# Patient Record
Sex: Female | Born: 1965 | Race: White | Hispanic: No | Marital: Single | Smoking: Former smoker
Health system: Southern US, Community
[De-identification: ages and names within clinical notes are randomized; demographics above are authoritative.]

## PROBLEM LIST (undated history)

## (undated) DIAGNOSIS — Z974 Presence of external hearing-aid: Secondary | ICD-10-CM

## (undated) DIAGNOSIS — M48061 Spinal stenosis, lumbar region without neurogenic claudication: Secondary | ICD-10-CM

## (undated) DIAGNOSIS — I10 Essential (primary) hypertension: Secondary | ICD-10-CM

## (undated) DIAGNOSIS — G4733 Obstructive sleep apnea (adult) (pediatric): Secondary | ICD-10-CM

## (undated) DIAGNOSIS — G4730 Sleep apnea, unspecified: Secondary | ICD-10-CM

## (undated) DIAGNOSIS — M5416 Radiculopathy, lumbar region: Secondary | ICD-10-CM

## (undated) DIAGNOSIS — M2170 Unequal limb length (acquired), unspecified site: Secondary | ICD-10-CM

## (undated) DIAGNOSIS — M301 Polyarteritis with lung involvement [Churg-Strauss]: Secondary | ICD-10-CM

## (undated) DIAGNOSIS — I4891 Unspecified atrial fibrillation: Secondary | ICD-10-CM

## (undated) DIAGNOSIS — J4489 Other specified chronic obstructive pulmonary disease: Secondary | ICD-10-CM

## (undated) DIAGNOSIS — I509 Heart failure, unspecified: Secondary | ICD-10-CM

## (undated) DIAGNOSIS — D72829 Elevated white blood cell count, unspecified: Secondary | ICD-10-CM

## (undated) DIAGNOSIS — G8929 Other chronic pain: Secondary | ICD-10-CM

## (undated) DIAGNOSIS — I209 Angina pectoris, unspecified: Secondary | ICD-10-CM

## (undated) DIAGNOSIS — Q2112 Patent foramen ovale: Secondary | ICD-10-CM

## (undated) DIAGNOSIS — I776 Arteritis, unspecified: Secondary | ICD-10-CM

## (undated) DIAGNOSIS — R001 Bradycardia, unspecified: Secondary | ICD-10-CM

## (undated) DIAGNOSIS — M313 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: Secondary | ICD-10-CM

## (undated) DIAGNOSIS — Q211 Atrial septal defect: Secondary | ICD-10-CM

## (undated) DIAGNOSIS — U071 COVID-19: Secondary | ICD-10-CM

## (undated) DIAGNOSIS — J301 Allergic rhinitis due to pollen: Secondary | ICD-10-CM

## (undated) DIAGNOSIS — R600 Localized edema: Secondary | ICD-10-CM

## (undated) DIAGNOSIS — Z86711 Personal history of pulmonary embolism: Secondary | ICD-10-CM

## (undated) DIAGNOSIS — D649 Anemia, unspecified: Secondary | ICD-10-CM

## (undated) DIAGNOSIS — Z87442 Personal history of urinary calculi: Secondary | ICD-10-CM

## (undated) DIAGNOSIS — E669 Obesity, unspecified: Secondary | ICD-10-CM

## (undated) DIAGNOSIS — M8580 Other specified disorders of bone density and structure, unspecified site: Secondary | ICD-10-CM

## (undated) DIAGNOSIS — M1990 Unspecified osteoarthritis, unspecified site: Secondary | ICD-10-CM

## (undated) DIAGNOSIS — G894 Chronic pain syndrome: Secondary | ICD-10-CM

## (undated) DIAGNOSIS — J849 Interstitial pulmonary disease, unspecified: Secondary | ICD-10-CM

## (undated) DIAGNOSIS — G43909 Migraine, unspecified, not intractable, without status migrainosus: Secondary | ICD-10-CM

## (undated) DIAGNOSIS — K3184 Gastroparesis: Secondary | ICD-10-CM

## (undated) DIAGNOSIS — M419 Scoliosis, unspecified: Secondary | ICD-10-CM

## (undated) DIAGNOSIS — I499 Cardiac arrhythmia, unspecified: Secondary | ICD-10-CM

## (undated) DIAGNOSIS — T82539A Leakage of unspecified cardiac and vascular devices and implants, initial encounter: Secondary | ICD-10-CM

## (undated) DIAGNOSIS — I2510 Atherosclerotic heart disease of native coronary artery without angina pectoris: Secondary | ICD-10-CM

## (undated) DIAGNOSIS — I5030 Unspecified diastolic (congestive) heart failure: Secondary | ICD-10-CM

## (undated) DIAGNOSIS — M359 Systemic involvement of connective tissue, unspecified: Secondary | ICD-10-CM

## (undated) DIAGNOSIS — K08109 Complete loss of teeth, unspecified cause, unspecified class: Secondary | ICD-10-CM

## (undated) DIAGNOSIS — E785 Hyperlipidemia, unspecified: Secondary | ICD-10-CM

## (undated) DIAGNOSIS — M069 Rheumatoid arthritis, unspecified: Secondary | ICD-10-CM

## (undated) DIAGNOSIS — Q245 Malformation of coronary vessels: Secondary | ICD-10-CM

## (undated) DIAGNOSIS — J320 Chronic maxillary sinusitis: Secondary | ICD-10-CM

## (undated) DIAGNOSIS — J1282 Pneumonia due to coronavirus disease 2019: Secondary | ICD-10-CM

## (undated) DIAGNOSIS — Z8719 Personal history of other diseases of the digestive system: Secondary | ICD-10-CM

## (undated) DIAGNOSIS — Z972 Presence of dental prosthetic device (complete) (partial): Secondary | ICD-10-CM

## (undated) DIAGNOSIS — F1190 Opioid use, unspecified, uncomplicated: Secondary | ICD-10-CM

## (undated) DIAGNOSIS — K219 Gastro-esophageal reflux disease without esophagitis: Secondary | ICD-10-CM

## (undated) DIAGNOSIS — I700 Atherosclerosis of aorta: Secondary | ICD-10-CM

## (undated) DIAGNOSIS — M310 Hypersensitivity angiitis: Secondary | ICD-10-CM

## (undated) DIAGNOSIS — Z9689 Presence of other specified functional implants: Secondary | ICD-10-CM

## (undated) DIAGNOSIS — R768 Other specified abnormal immunological findings in serum: Secondary | ICD-10-CM

## (undated) HISTORY — PX: APPENDECTOMY: SHX54

## (undated) HISTORY — DX: Arteritis, unspecified: I77.6

## (undated) HISTORY — DX: Gastro-esophageal reflux disease without esophagitis: K21.9

## (undated) HISTORY — PX: SHOULDER SURGERY: SHX246

## (undated) HISTORY — PX: KNEE SURGERY: SHX244

## (undated) HISTORY — DX: Heart failure, unspecified: I50.9

## (undated) HISTORY — PX: BACK SURGERY: SHX140

## (undated) HISTORY — PX: SHOULDER ARTHROSCOPY: SHX128

## (undated) HISTORY — DX: Rheumatoid arthritis, unspecified: M06.9

## (undated) HISTORY — PX: OTHER SURGICAL HISTORY: SHX169

## (undated) HISTORY — PX: JOINT REPLACEMENT: SHX530

## (undated) HISTORY — PX: KNEE ARTHROSCOPY WITH MENISCAL REPAIR: SHX5653

## (undated) HISTORY — PX: KNEE ARTHROPLASTY: SHX992

## (undated) HISTORY — PX: CHOLECYSTECTOMY: SHX55

---

## 2007-09-05 ENCOUNTER — Emergency Department: Payer: Self-pay | Admitting: Emergency Medicine

## 2007-09-14 ENCOUNTER — Emergency Department (HOSPITAL_COMMUNITY): Admission: EM | Admit: 2007-09-14 | Discharge: 2007-09-14 | Payer: Self-pay | Admitting: Emergency Medicine

## 2007-11-22 ENCOUNTER — Emergency Department: Payer: Self-pay | Admitting: Emergency Medicine

## 2008-01-15 ENCOUNTER — Emergency Department: Payer: Self-pay | Admitting: Emergency Medicine

## 2008-02-23 ENCOUNTER — Emergency Department: Payer: Self-pay | Admitting: Emergency Medicine

## 2008-02-24 ENCOUNTER — Emergency Department (HOSPITAL_COMMUNITY): Admission: EM | Admit: 2008-02-24 | Discharge: 2008-02-24 | Payer: Self-pay | Admitting: Psychology

## 2008-04-07 ENCOUNTER — Emergency Department: Payer: Self-pay | Admitting: Emergency Medicine

## 2008-05-02 ENCOUNTER — Emergency Department: Payer: Self-pay | Admitting: Emergency Medicine

## 2008-05-03 ENCOUNTER — Emergency Department: Payer: Self-pay | Admitting: Emergency Medicine

## 2008-06-27 ENCOUNTER — Ambulatory Visit: Payer: Self-pay | Admitting: Specialist

## 2008-06-29 ENCOUNTER — Emergency Department: Payer: Self-pay | Admitting: Emergency Medicine

## 2008-07-05 ENCOUNTER — Ambulatory Visit: Payer: Self-pay | Admitting: Specialist

## 2008-08-09 ENCOUNTER — Emergency Department: Payer: Self-pay | Admitting: Emergency Medicine

## 2008-08-09 ENCOUNTER — Other Ambulatory Visit: Payer: Self-pay

## 2008-09-18 ENCOUNTER — Emergency Department: Payer: Self-pay | Admitting: Emergency Medicine

## 2008-11-01 ENCOUNTER — Emergency Department: Payer: Self-pay | Admitting: Unknown Physician Specialty

## 2009-02-02 ENCOUNTER — Emergency Department: Payer: Self-pay | Admitting: Emergency Medicine

## 2009-02-04 ENCOUNTER — Emergency Department: Payer: Self-pay | Admitting: Emergency Medicine

## 2009-03-13 ENCOUNTER — Emergency Department: Payer: Self-pay | Admitting: Emergency Medicine

## 2009-03-29 ENCOUNTER — Emergency Department: Payer: Self-pay | Admitting: Emergency Medicine

## 2009-03-31 ENCOUNTER — Emergency Department: Payer: Self-pay | Admitting: Emergency Medicine

## 2009-04-11 ENCOUNTER — Emergency Department: Payer: Self-pay | Admitting: Emergency Medicine

## 2009-08-17 ENCOUNTER — Emergency Department: Payer: Self-pay | Admitting: Emergency Medicine

## 2017-01-20 DIAGNOSIS — G894 Chronic pain syndrome: Secondary | ICD-10-CM | POA: Insufficient documentation

## 2017-01-20 DIAGNOSIS — M47816 Spondylosis without myelopathy or radiculopathy, lumbar region: Secondary | ICD-10-CM | POA: Insufficient documentation

## 2017-08-17 DIAGNOSIS — Q211 Atrial septal defect: Secondary | ICD-10-CM | POA: Insufficient documentation

## 2017-08-17 DIAGNOSIS — Q2112 Patent foramen ovale: Secondary | ICD-10-CM | POA: Insufficient documentation

## 2018-01-16 DIAGNOSIS — M5416 Radiculopathy, lumbar region: Secondary | ICD-10-CM | POA: Insufficient documentation

## 2018-01-16 DIAGNOSIS — M48061 Spinal stenosis, lumbar region without neurogenic claudication: Secondary | ICD-10-CM | POA: Insufficient documentation

## 2018-01-16 DIAGNOSIS — M25551 Pain in right hip: Secondary | ICD-10-CM | POA: Insufficient documentation

## 2018-09-13 DIAGNOSIS — M159 Polyosteoarthritis, unspecified: Secondary | ICD-10-CM | POA: Insufficient documentation

## 2018-09-29 DIAGNOSIS — K3184 Gastroparesis: Secondary | ICD-10-CM | POA: Insufficient documentation

## 2018-10-10 DIAGNOSIS — M313 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: Secondary | ICD-10-CM | POA: Insufficient documentation

## 2019-01-01 DIAGNOSIS — M069 Rheumatoid arthritis, unspecified: Secondary | ICD-10-CM | POA: Insufficient documentation

## 2019-01-01 DIAGNOSIS — J45909 Unspecified asthma, uncomplicated: Secondary | ICD-10-CM | POA: Insufficient documentation

## 2019-01-01 DIAGNOSIS — I482 Chronic atrial fibrillation, unspecified: Secondary | ICD-10-CM | POA: Insufficient documentation

## 2019-01-01 DIAGNOSIS — G4733 Obstructive sleep apnea (adult) (pediatric): Secondary | ICD-10-CM | POA: Insufficient documentation

## 2019-01-01 DIAGNOSIS — I509 Heart failure, unspecified: Secondary | ICD-10-CM | POA: Insufficient documentation

## 2019-01-01 DIAGNOSIS — I25119 Atherosclerotic heart disease of native coronary artery with unspecified angina pectoris: Secondary | ICD-10-CM | POA: Insufficient documentation

## 2019-01-08 ENCOUNTER — Other Ambulatory Visit: Payer: Self-pay | Admitting: Internal Medicine

## 2019-01-08 DIAGNOSIS — J329 Chronic sinusitis, unspecified: Secondary | ICD-10-CM

## 2019-01-08 DIAGNOSIS — J3489 Other specified disorders of nose and nasal sinuses: Secondary | ICD-10-CM

## 2019-01-24 ENCOUNTER — Ambulatory Visit: Payer: Self-pay

## 2019-01-29 ENCOUNTER — Ambulatory Visit
Admission: RE | Admit: 2019-01-29 | Discharge: 2019-01-29 | Disposition: A | Discharge status: Home / Self Care | Payer: Medicare Other | Source: Ambulatory Visit | Attending: Internal Medicine | Admitting: Internal Medicine

## 2019-01-29 DIAGNOSIS — J329 Chronic sinusitis, unspecified: Secondary | ICD-10-CM

## 2019-01-29 DIAGNOSIS — J3489 Other specified disorders of nose and nasal sinuses: Secondary | ICD-10-CM

## 2019-02-13 DIAGNOSIS — G8929 Other chronic pain: Secondary | ICD-10-CM | POA: Insufficient documentation

## 2019-03-29 ENCOUNTER — Ambulatory Visit: Payer: Medicare Other | Admitting: Student in an Organized Health Care Education/Training Program

## 2019-06-26 ENCOUNTER — Other Ambulatory Visit: Payer: Self-pay | Admitting: Physical Medicine and Rehabilitation

## 2019-06-26 ENCOUNTER — Other Ambulatory Visit: Payer: Self-pay | Admitting: Internal Medicine

## 2019-06-26 DIAGNOSIS — M5416 Radiculopathy, lumbar region: Secondary | ICD-10-CM

## 2019-06-26 DIAGNOSIS — Z1231 Encounter for screening mammogram for malignant neoplasm of breast: Secondary | ICD-10-CM

## 2019-07-31 ENCOUNTER — Ambulatory Visit
Admission: RE | Admit: 2019-07-31 | Discharge: 2019-07-31 | Disposition: A | Discharge status: Home / Self Care | Payer: Medicare Other | Source: Ambulatory Visit | Attending: Physical Medicine and Rehabilitation | Admitting: Physical Medicine and Rehabilitation

## 2019-07-31 DIAGNOSIS — M5416 Radiculopathy, lumbar region: Secondary | ICD-10-CM

## 2019-08-06 ENCOUNTER — Ambulatory Visit
Admission: RE | Admit: 2019-08-06 | Discharge: 2019-08-06 | Disposition: A | Discharge status: Home / Self Care | Payer: Medicare Other | Source: Ambulatory Visit | Attending: Internal Medicine | Admitting: Internal Medicine

## 2019-08-06 ENCOUNTER — Other Ambulatory Visit: Payer: Self-pay

## 2019-08-06 DIAGNOSIS — Z1231 Encounter for screening mammogram for malignant neoplasm of breast: Secondary | ICD-10-CM

## 2020-01-20 DIAGNOSIS — Z96651 Presence of right artificial knee joint: Secondary | ICD-10-CM | POA: Insufficient documentation

## 2020-01-20 DIAGNOSIS — I493 Ventricular premature depolarization: Secondary | ICD-10-CM | POA: Insufficient documentation

## 2020-01-28 ENCOUNTER — Encounter: Payer: Self-pay | Admitting: Student in an Organized Health Care Education/Training Program

## 2020-01-28 NOTE — Progress Notes (Addendum)
Patient: Cassandra Brown  Service Category: E/M  Provider: Gillis Santa, MD  DOB: [birth year]  DOS: 01/29/2020  Location: Office  MRN: [id number]  Setting: Ambulatory outpatient  Referring Provider: Deetta Perla, MD  Type: New Patient  Specialty: Interventional Pain Management  PCP: Baxter Hire, MD  Location: Home  Delivery: TeleHealth     Virtual Encounter - Pain Management PROVIDER NOTE: Information contained herein reflects review and annotations entered in association with encounter. Interpretation of such information and data should be left to medically-trained personnel. Information provided to patient can be located elsewhere in the medical record under "Patient Instructions". Document created using STT-dictation technology, any transcriptional errors that may result from process are unintentional.    Contact & Pharmacy Preferred: [phone] Home: [phone] (home) Mobile: [phone] (mobile) E-mail: [email or web address] [place] - [place] [address] Phone: [phone] Fax: [phone]   Pre-screening note:  Our staff contacted Cassandra Brown and offered her an "in person", "face-to-face" appointment versus a telephone encounter. She indicated preferring the telephone encounter, at this time.  Primary Reason(s) for Visit: Tele-Encounter for initial evaluation of one or more chronic problems (new to examiner) potentially causing chronic pain, and posing a threat to normal musculoskeletal function. (Level of risk: High) CC: Back Pain (low)  I contacted Cassandra Brown on 01/29/2020 via telephone.      I clearly identified myself as Gillis Santa, MD. I verified that I was speaking with the correct person using two identifiers (Name: Cassandra Brown, and date of birth: 11-03-1966).  This visit was completed via telephone due to the restrictions of the COVID-19 pandemic. All issues as above were discussed and addressed  but no physical exam was performed. If it was felt that the patient should be evaluated in the office, they were directed there. The patient verbally consented to this visit. Patient was unable to complete an audio/visual visit due to Technical difficulties and/or Lack of internet. Due to the catastrophic nature of the COVID-19 pandemic, this visit was done through audio contact only.  Location of the patient: home address (see Epic for details)  Location of the provider: office  Advanced Informed Consent I sought verbal advanced consent from Cassandra Brown for virtual visit interactions. I informed Cassandra Brown of possible security and privacy concerns, risks, and limitations associated with providing "not-in-person" medical evaluation and management services. I also informed Cassandra Brown of the availability of "in-person" appointments. Finally, I informed her that there would be a charge for the virtual visit and that she could be  personally, fully or partially, financially responsible for it. Cassandra Brown expressed understanding and agreed to proceed.   HPI  Cassandra Brown is a 54 y.o. year old, female patient, contacted today for an initial evaluation of her chronic pain. 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   Onset and Duration: Gradual and Present longer than 3 months Cause of  pain: Unknown Severity: Getting worse, NAS-11 at its worse: 8/10, NAS-11 at its  best: 5/10, NAS-11 now: 5/10 and NAS-11 on the average: 6/10 Timing: Morning and Night Aggravating Factors: Bending, Climbing, Kneeling, Lifiting, Motion, Prolonged sitting, Prolonged standing, Squatting, Stooping , Twisting, Walking, Walking uphill and Walking downhill Alleviating Factors: Lying down and Medications Associated Problems: Day-time cramps, Night-time cramps, Numbness, Swelling, Pain that wakes patient up and Pain that does not allow patient to sleep Quality of Pain: Aching, Constant, Dull, Sharp, Stabbing and Uncomfortable Previous Examinations or Tests: MRI scan and Neurosurgical evaluation Previous Treatments: Epidural steroid injections  History of LBP and leg pain. This has been a chronic problem for her.  Patient moved from [place] in 2020.  Her back pain also radiates down the lateral aspect of her leg down to her left foot.  She also endorses numbness of her left toes and feet.  She was previously established at a pain clinic in [place].  She has had multiple interventions for her spine including facet injections, radiofrequency ablations, epidural steroid injections and transforaminal injections without any significant long-term benefit.  She has done physical therapy in the past.  She denies having had lumbar spine surgery before.  She denies weakness or bowel or bladder dysfunction.  She misses canoeing and doing other outdoor activities.  Patient does have atrial fibrillation, chronic obesity and rheumatoid arthritis.  She is being referred here for consideration of spinal cord stimulator trial.  In regards to medications, patient is on Soma 250 mg 3 times daily, hydrocodone 10 mg 4 times daily as needed, Lyrica 25 mg twice a day.  Patient will continue medication management with her primary care provider.  Meds   Current Outpatient Medications:  .  albuterol (VENTOLIN HFA) 108 (90 Base)  MCG/ACT inhaler, Inhale into the lungs., Disp: , Rfl:  .  aspirin EC 81 MG tablet, Take 81 mg by mouth daily., Disp: , Rfl:  .  azelastine (ASTELIN) 0.1 % nasal spray, Place into the nose., Disp: , Rfl:  .  bumetanide (BUMEX) 2 MG tablet, Take by mouth daily., Disp: , Rfl:  .  calcium carbonate (OSCAL) 1500 (600 Ca) MG TABS tablet, Take 1,500 mg by mouth 2 (two) times daily with a meal., Disp: , Rfl:  .  carisoprodol (SOMA) 350 MG tablet, Take 350 mg by mouth 3 (three) times daily., Disp: , Rfl:  .  folic acid (FOLVITE) 1 MG tablet, Take 1 mg by mouth daily., Disp: , Rfl:  .  gentamicin ointment (GARAMYCIN) 0.1 %, as needed, Disp: , Rfl:  .  Golimumab (SIMPONI) 50 MG/0.5ML SOAJ, Inject 50 mg into the skin every 30 (thirty) days., Disp: , Rfl:  .  methotrexate (50 MG/ML) 1 g injection, Inject 25 mg into the vein once a week., Disp: , Rfl:  .  metolazone (ZAROXOLYN) 5 MG tablet, Take by mouth., Disp: , Rfl:  .  montelukast (SINGULAIR) 10 MG tablet, Take 10 mg by mouth at bedtime., Disp: , Rfl:  .  pantoprazole (PROTONIX) 40 MG tablet, Take 40 mg by mouth daily., Disp: , Rfl:  .  pentoxifylline (TRENTAL) 400 MG CR tablet, Take 400 mg by mouth 3 (three) times daily with meals., Disp: , Rfl:  .  predniSONE (DELTASONE) 5 MG tablet, Take 5 mg by mouth daily with breakfast., Disp: , Rfl:  .  pregabalin (LYRICA) 25 MG capsule, Take 25 mg by mouth 2 (two) times daily., Disp: , Rfl:  .  promethazine (PHENERGAN) 25 MG tablet, Take 25 mg by mouth every 8 (eight) hours as needed for nausea  or vomiting., Disp: , Rfl:  .  sotalol AF (BETAPACE AF) 80 MG TABS tablet, Take 80 mg by mouth 2 (two) times daily., Disp: , Rfl:  .  Specialty Vitamins Products (CENTRUM PERFORMANCE) TABS, Take 1 tablet by mouth daily., Disp: , Rfl:  .  spironolactone (ALDACTONE) 25 MG tablet, Take 25 mg by mouth daily., Disp: , Rfl:  .  triamcinolone cream (KENALOG) 0.1 %, Apply topically., Disp: , Rfl:   ROS  Cardiovascular: Abnormal  heart rhythm, Daily Aspirin intake and Weak heart (CHF) Pulmonary or Respiratory: No reported pulmonary signs or symptoms such as wheezing and difficulty taking a deep full breath (Asthma), difficulty blowing air out (Emphysema), coughing up mucus (Bronchitis), persistent dry cough, or temporary stoppage of breathing during sleep Neurological: No reported neurological signs or symptoms such as seizures, abnormal skin sensations, urinary and/or fecal incontinence, being born with an abnormal open spine and/or a tethered spinal cord Psychological-Psychiatric: No reported psychological or psychiatric signs or symptoms such as difficulty sleeping, anxiety, depression, delusions or hallucinations (schizophrenial), mood swings (bipolar disorders) or suicidal ideations or attempts Gastrointestinal: Reflux or heatburn Genitourinary: No reported renal or genitourinary signs or symptoms such as difficulty voiding or producing urine, peeing blood, non-functioning kidney, kidney stones, difficulty emptying the bladder, difficulty controlling the flow of urine, or chronic kidney disease Hematological: No reported hematological signs or symptoms such as prolonged bleeding, low or poor functioning platelets, bruising or bleeding easily, hereditary bleeding problems, low energy levels due to low hemoglobin or being anemic Endocrine: No reported endocrine signs or symptoms such as high or low blood sugar, rapid heart rate due to high thyroid levels, obesity or weight gain due to slow thyroid or thyroid disease Rheumatologic: Rheumatoid arthritis Musculoskeletal: Negative for myasthenia gravis, muscular dystrophy, multiple sclerosis or malignant hyperthermia Work History: Disabled  Allergies  Ms. Posthuma is allergic to cyclobenzaprine; ibuprofen; morphine; meperidine; sulfa antibiotics; and onion.  Imaging Review    Lumbosacral Imaging: Lumbar MR wo contrast:  Results for orders placed during the hospital encounter  of 07/31/19  MR LUMBAR SPINE WO CONTRAST   Narrative CLINICAL DATA:  Lower back pain for 5 years, left buttock and left leg pain  EXAM: MRI LUMBAR SPINE WITHOUT CONTRAST  TECHNIQUE: Multiplanar, multisequence MR imaging of the lumbar spine was performed. No intravenous contrast was administered.  COMPARISON:  Radiograph February 04, 2009  FINDINGS: Segmentation: There are 5 non-rib bearing lumbar type vertebral bodies with the last intervertebral disc space labeled as L5-S1.  Alignment:  There is a minimal anterolisthesis of L3 on L4.  Vertebrae: The vertebral body heights are well maintained. Increased STIR signal seen at the endplates of [id number]. No fracture is seen.  Conus medullaris and cauda equina: Conus extends to the L2 level. Conus and cauda equina appear normal.  Paraspinal and other soft tissues: The paraspinal soft tissues and visualized retroperitoneal structures are unremarkable. The sacroiliac joints are intact.  Disc levels:  T12-L1:  No significant canal or neural foraminal narrowing.  L1-L2: There is a minimal broad-based disc bulge and facet arthrosis, however no significant canal or neural foraminal narrowing.  L2-L3: There is a broad-based disc bulge with facet arthrosis which causes moderate right and mild left neural foraminal narrowing.  L3-L4: There is a broad-based disc bulge with facet arthrosis and ligamentum flavum hypertrophy causes severe right and mild left neural foraminal narrowing. There is mild effacement anterior thecal sac.  L4-L5: There is a broad-based disc bulge with a tiny left foraminal disc  protrusion. There is moderate right and mild left neural foraminal narrowing.  L5-S1: There is a broad-based disc bulge and a left far lateral disc protrusion which contacts and impinges the exiting left L5 nerve root. There is severe left and mild right neural foraminal narrowing.  IMPRESSION: Lumbar spine spondylosis most notable at  L5-S1 with a left far lateral disc protrusion which contacts and impinges the exiting L5 nerve root. There is severe left neural foraminal narrowing.   Electronically Signed   By: Prudencio Pair M.D.    On: 07/31/2019 12:32     Complexity Note: Imaging results reviewed. Results shared with Ms. Javier, using Layman's terms.                         [place]  Drug: Ms. Daffron  reports no history of drug use. Alcohol:  reports previous alcohol use. Tobacco:  reports that she has quit smoking. She has never used smokeless tobacco. Medical:  has a past medical history of CHF (congestive heart failure) ([place]), GERD (gastroesophageal reflux disease), Rheumatoid arthritis ([place]), and Vasculitis ([place]). Family: family history is not on file.  Past Surgical History:  Procedure Laterality Date  . JOINT REPLACEMENT Right    knee  . KNEE SURGERY Left   . SHOULDER SURGERY Bilateral    Active Ambulatory Problems    Diagnosis Date Noted  . Chronic pain syndrome 01/20/2017  . Chronic sacroiliac joint pain 02/13/2019  . Lumbosacral radiculopathy at L5 01/16/2018  . Spinal stenosis of lumbar region without neurogenic claudication 01/16/2018  . Status post total right knee replacement 01/20/2020  . 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 Chronic radicular lumbar pain 01/29/2020  . Atrial fibrillation, chronic ([place]) 01/01/2019  . Asthma 01/01/2019  . Coronary artery disease involving native heart with angina pectoris ([place]) 01/01/2019  . Frequent PVCs 01/20/2020  . Heart failure, unspecified ([place]) 01/01/2019  . Obstructive sleep apnea 01/01/2019  . Osteoarthritis of multiple joints 09/13/2018  . Patent foramen ovale 08/17/2017  . Rheumatoid arthritis ([place]) 01/01/2019  . Spondylosis without myelopathy or radiculopathy, lumbar region 01/20/2017  . Right hip pain 01/16/2018   Resolved Ambulatory Problems    Diagnosis Date Noted  . No Resolved Ambulatory Problems    Past Medical History:  Diagnosis Date  . CHF (congestive heart failure) ([place])   . GERD (gastroesophageal reflux disease)   . Vasculitis [facility])    Assessment  Primary Diagnosis & Pertinent Problem List: The primary encounter diagnosis was Lumbar radiculopathy, chronic (LEFT). 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  Visit Diagnosis (New problems to examiner): 1. Lumbar radiculopathy, chronic (LEFT)   2. Lumbosacral radiculopathy at L5 (left)   3. Chronic radicular lumbar pain   4. 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 Chronic sacroiliac joint pain   6. Spinal stenosis of lumbar region without neurogenic claudication   7. Status post total right knee replacement   8. Chronic pain syndrome    Plan of Care (Initial workup plan)   Ms. Carouthers is a pleasant 54 year old female who presents with a chief complaint of low back pain which radiates to her left leg in a dermatomal fashion secondary to left L5 lumbosacral radiculopathy.  Patient recently moved from  [place] in 2020.  In [place] patient has tried various interventional therapies including facet blocks, radiofrequency ablation, multiple interlaminar and transforaminal epidural steroid injections.  These provided her with short-term mild pain relief for over time as her symptoms have worsened, she is looking for alternative therapies.  She is not interested in surgery nor is she a surgical candidate after being evaluated by Dr. Lacinda Axon.  She has been referred here for consideration of spinal cord stimulator trial.  I had extensive discussion with the patient regarding neuromodulation and what a spinal cord stimulator  trial entails.  We discussed the risks of a spinal cord stimulator trial and if the trial is successful that she would return to Dr. Lacinda Axon for a permanent implant which could include a percutaneous lead or a paddle.  For work-up, I will evaluate the patient's interlaminar windows at T12-L1 and L1-L2 under live fluoroscopy in clinic.  There for interlaminar windows are appropriate without significant facet arthrosis, will send for thoracic MRI to evaluate thoracic spinal canal stenosis.  We will also send referral to psychology for pretrial/implant assessment which is required.  1. T12-L1 interlaminar window appears patent and appropriate for possible percutaneous trial. 2. thoracic MRI to rule out thoracic spinal stenosis, for spinal cord stimulator trial/implant planning 3. Referral to psychology for pre-trial/implant eval 4.  Instructed patient to call our clinic after she has completed her thoracic MRI and has seen psychologist to review/schedule spinal cord stimulator trial  Orders Placed This Encounter  Procedures  . MR THORACIC SPINE WO CONTRAST    Patient presents with axial pain with possible radicular component.  In addition to any acute findings, please report on:  1. Facet (Zygapophyseal) joint DJD (Hypertrophy, space narrowing, subchondral sclerosis, and/or osteophyte formation) 2. DDD and/or IVDD (Loss of disc height, desiccation or "Black disc disease") 3. Pars defects 4. Spondylolisthesis, spondylosis, and/or spondyloarthropathies (include Degree/Grade of displacement in mm) 5. Vertebral body Fractures, including age (old, new/acute) 51. Modic Type Changes 7. Demineralization 8. Bone pathology 9. Central, Lateral Recess, and/or Foraminal Stenosis (include AP diameter of stenosis in mm) 10. Surgical changes (hardware type, status, and presence of fibrosis) NOTE: Please specify level(s) and laterality.    Standing Status:   Future    Standing Expiration Date:   04/28/2020    Order  Specific Question:   What is the patient's sedation requirement?    Answer:   No Sedation    Order Specific Question:   Does the patient have a pacemaker or implanted devices?    Answer:   No    Order Specific Question:   Preferred imaging location?    Answer:   [facility] (table limit-500 lbs)    Order Specific Question:   Call Results- Best Contact Number?    Answer:   [phone] ([facility])    Order Specific Question:   Radiology Contrast Protocol - do NOT remove file path    Answer:   [email or web address]    Order Specific Question:   ** REASON FOR EXAM (FREE TEXT)    Answer:   pre-surgical planning for thoracic spinal cord stimulator trial/implant  . Ambulatory referral to Psychology    Referral Priority:   Routine    Referral Type:   Psychiatric    Referral Reason:   Specialty Services Required    Referred to Provider:   Renaee Munda, PhD    Requested Specialty:   Psychology    Number of Visits Requested:   1  Ms. Goodwine was informed that there is no guarantee that she would be a candidate for interventional therapies. The decision will be based on the results of diagnostic studies, as well as Ms. Tomkinson's risk profile.  Procedure(s) under consideration:  Spinal Cord Stimulator Trial   Interventional management options:SCS   Provider-requested follow-up: Return in about 1 day (around 01/30/2020).  No future appointments. Total duration of encounter: 72minutes.  Primary Care Physician: Baxter Hire, MD Note by: Gillis Santa, MD Date: 01/29/2020; Time: 12:46 PM

## 2020-01-29 ENCOUNTER — Ambulatory Visit
Payer: Medicare Other | Attending: Student in an Organized Health Care Education/Training Program | Admitting: Student in an Organized Health Care Education/Training Program

## 2020-01-29 ENCOUNTER — Other Ambulatory Visit: Payer: Self-pay

## 2020-01-29 ENCOUNTER — Encounter: Payer: Self-pay | Admitting: Student in an Organized Health Care Education/Training Program

## 2020-01-29 VITALS — Ht 62.0 in | Wt 204.0 lb

## 2020-01-29 DIAGNOSIS — Z96651 Presence of right artificial knee joint: Secondary | ICD-10-CM

## 2020-01-29 DIAGNOSIS — G8929 Other chronic pain: Secondary | ICD-10-CM

## 2020-01-29 DIAGNOSIS — M533 Sacrococcygeal disorders, not elsewhere classified: Secondary | ICD-10-CM | POA: Diagnosis not present

## 2020-01-29 DIAGNOSIS — M313 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: Secondary | ICD-10-CM

## 2020-01-29 DIAGNOSIS — M5417 Radiculopathy, lumbosacral region: Secondary | ICD-10-CM

## 2020-01-29 DIAGNOSIS — M5416 Radiculopathy, lumbar region: Secondary | ICD-10-CM | POA: Diagnosis not present

## 2020-01-29 DIAGNOSIS — G894 Chronic pain syndrome: Secondary | ICD-10-CM

## 2020-01-29 DIAGNOSIS — M48061 Spinal stenosis, lumbar region without neurogenic claudication: Secondary | ICD-10-CM

## 2020-01-30 ENCOUNTER — Other Ambulatory Visit: Payer: Self-pay

## 2020-01-30 ENCOUNTER — Ambulatory Visit
Admission: RE | Admit: 2020-01-30 | Discharge: 2020-01-30 | Disposition: A | Discharge status: Home / Self Care | Payer: Medicare Other | Source: Ambulatory Visit | Attending: Student in an Organized Health Care Education/Training Program | Admitting: Student in an Organized Health Care Education/Training Program

## 2020-01-30 ENCOUNTER — Other Ambulatory Visit: Payer: Self-pay | Admitting: Student in an Organized Health Care Education/Training Program

## 2020-01-30 ENCOUNTER — Ambulatory Visit (HOSPITAL_BASED_OUTPATIENT_CLINIC_OR_DEPARTMENT_OTHER): Payer: Medicare Other | Admitting: Student in an Organized Health Care Education/Training Program

## 2020-01-30 DIAGNOSIS — R52 Pain, unspecified: Secondary | ICD-10-CM

## 2020-01-30 DIAGNOSIS — M5417 Radiculopathy, lumbosacral region: Secondary | ICD-10-CM

## 2020-01-30 NOTE — Addendum Note (Signed)
Addended by: Gillis Santa on: 01/30/2020 12:46 PM   Modules accepted: Orders

## 2020-01-30 NOTE — Progress Notes (Signed)
See addendum to new patient note from yesterday.  Patient brought in today to evaluate interlaminar windows under fluoroscopy for suitability of percutaneous spinal cord stimulator trial.  Interlaminar windows appear appropriate.  We will send patient for thoracic MRI and psych evaluation.

## 2020-02-02 ENCOUNTER — Other Ambulatory Visit: Payer: Self-pay | Admitting: Student in an Organized Health Care Education/Training Program

## 2020-02-02 DIAGNOSIS — M5416 Radiculopathy, lumbar region: Secondary | ICD-10-CM

## 2020-02-02 DIAGNOSIS — G894 Chronic pain syndrome: Secondary | ICD-10-CM

## 2020-02-18 ENCOUNTER — Telehealth: Payer: Self-pay | Admitting: Student in an Organized Health Care Education/Training Program

## 2020-02-18 NOTE — Telephone Encounter (Signed)
 Imaging called asking to have a signed order for the MRI sent to them Wednesday  Fax is [phone] Phone [phone]

## 2020-02-25 ENCOUNTER — Ambulatory Visit
Admission: RE | Admit: 2020-02-25 | Discharge: 2020-02-25 | Disposition: A | Discharge status: Home / Self Care | Payer: Medicare Other | Source: Ambulatory Visit | Attending: Student in an Organized Health Care Education/Training Program | Admitting: Student in an Organized Health Care Education/Training Program

## 2020-02-25 ENCOUNTER — Other Ambulatory Visit: Payer: Self-pay

## 2020-02-25 DIAGNOSIS — G894 Chronic pain syndrome: Secondary | ICD-10-CM

## 2020-02-25 DIAGNOSIS — M5416 Radiculopathy, lumbar region: Secondary | ICD-10-CM

## 2020-03-10 ENCOUNTER — Telehealth: Payer: Self-pay

## 2020-03-11 ENCOUNTER — Ambulatory Visit
Payer: Medicare Other | Attending: Student in an Organized Health Care Education/Training Program | Admitting: Student in an Organized Health Care Education/Training Program

## 2020-03-11 ENCOUNTER — Encounter: Payer: Self-pay | Admitting: Student in an Organized Health Care Education/Training Program

## 2020-03-11 ENCOUNTER — Other Ambulatory Visit: Payer: Self-pay

## 2020-03-11 DIAGNOSIS — M5416 Radiculopathy, lumbar region: Secondary | ICD-10-CM

## 2020-03-11 DIAGNOSIS — G8929 Other chronic pain: Secondary | ICD-10-CM

## 2020-03-11 DIAGNOSIS — M48061 Spinal stenosis, lumbar region without neurogenic claudication: Secondary | ICD-10-CM | POA: Diagnosis not present

## 2020-03-11 DIAGNOSIS — G894 Chronic pain syndrome: Secondary | ICD-10-CM | POA: Diagnosis not present

## 2020-03-11 DIAGNOSIS — M5417 Radiculopathy, lumbosacral region: Secondary | ICD-10-CM | POA: Diagnosis not present

## 2020-03-11 NOTE — Progress Notes (Signed)
Patient: Cassandra Brown  Service Category: E/M  Provider: Gillis Santa, MD  DOB: 18-Mar-1966  DOS: 03/11/2020  Location: Office  MRN: [id number]  Setting: Ambulatory outpatient  Referring Provider: Baxter Hire, MD  Type: Established Patient  Specialty: Interventional Pain Management  PCP: Baxter Hire, MD  Location: Home  Delivery: TeleHealth     Virtual Encounter - Pain Management PROVIDER NOTE: Information contained herein reflects review and annotations entered in association with encounter. Interpretation of such information and data should be left to medically-trained personnel. Information provided to patient can be located elsewhere in the medical record under "Patient Instructions". Document created using STT-dictation technology, any transcriptional errors that may result from process are unintentional.    Contact & Pharmacy Preferred: [phone] Home: [phone] (home) Mobile: [phone] (mobile) E-mail: [email or web address] .[place], [place] - [address] [place] [address] Phone: [phone] Fax: [phone]   Pre-screening  Cassandra Brown offered "in-person" vs "virtual" encounter. She indicated preferring virtual for this encounter.   Reason COVID-19*  Social distancing based on CDC and AMA recommendations.   I contacted Cassandra Brown on 03/11/2020 via telephone.      I clearly identified myself as Gillis Santa, MD. I verified that I was speaking with the correct person using two identifiers (Name: Cassandra Brown, and date of birth: 1966/06/09).  This visit was completed via telephone due to the restrictions of the COVID-19 pandemic. All issues as above were discussed and addressed but no physical exam was performed. If it was felt that the patient should be evaluated in the office, they were directed there. The patient verbally consented to this visit. Patient was unable to complete an audio/visual visit due to  Technical difficulties and/or Lack of internet. Due to the catastrophic nature of the COVID-19 pandemic, this visit was done through audio contact only.  Location of the patient: home address (see Epic for details)  Location of the provider: office  Consent I sought verbal advanced consent from Cassandra Brown for virtual visit interactions. I informed Cassandra Brown of possible security and privacy concerns, risks, and limitations associated with providing "not-in-person" medical evaluation and management services. I also informed Cassandra Brown of the availability of "in-person" appointments. Finally, I informed her that there would be a charge for the virtual visit and that she could be  personally, fully or partially, financially responsible for it. Cassandra Brown expressed understanding and agreed to proceed.   Historic Elements   Cassandra Brown is a 54 y.o. year old, female patient evaluated today after her last contact with our practice on 03/10/2020. Cassandra Brown  has a past medical history of CHF (congestive heart failure) ([place]), GERD (gastroesophageal reflux disease), Rheumatoid arthritis ([place]), and Vasculitis ([place]). She also  has a past surgical history that includes Joint replacement (Right); Knee surgery (Left); and Shoulder surgery (Bilateral). Cassandra Brown has a current medication list which includes the following prescription(s): albuterol, aspirin ec, azelastine, bumetanide, calcium carbonate, carisoprodol, folic acid, gentamicin ointment, simponi, methotrexate, metolazone, montelukast, pantoprazole, pentoxifylline, prednisone, pregabalin, promethazine, sotalol af, centrum performance, spironolactone, and triamcinolone cream. She  reports that she has quit smoking. She has never used smokeless tobacco. She reports previous alcohol use. She reports that she does not use drugs. Cassandra Brown is allergic to cyclobenzaprine; ibuprofen; morphine; meperidine; sulfa antibiotics; and onion.   HPI  Today,  she is being contacted for follow-up evaluation   Discuss thoracic MRI results and psych visit  prior to SCS trial   Laboratory Chemistry Profile   Renal No results found for: BUN, CREATININE, LABCREA, BCR, GFR, GFRAA, GFRNONAA, LABVMA, EPIRU, [id number], NOREPRU, [id number], DOPARU, [id number]  Hepatic No results found for: AST, ALT, ALBUMIN, ALKPHOS, HCVAB, AMYLASE, LIPASE, AMMONIA  Electrolytes No results found for: NA, K, CL, CALCIUM, MG, PHOS  Bone No results found for: VD25OH, [id number], LD3570VX7, LT9030SP2, 25OHVITD1, 25OHVITD2, 25OHVITD3, TESTOFREE, TESTOSTERONE  Inflammation (CRP: Acute Phase) (ESR: Chronic Phase) No results found for: CRP, ESRSEDRATE, LATICACIDVEN    Note: Above Lab results reviewed.  Imaging  MR THORACIC SPINE WO CONTRAST CLINICAL DATA:  54 year old female with back pain radiating to the left leg. Numbness in the left foot.  Per chest x-ray report 01/04/2019 history of Wegener's granulomatosis.  EXAM: MRI THORACIC SPINE WITHOUT CONTRAST  TECHNIQUE: Multiplanar, multisequence MR imaging of the thoracic spine was performed. No intravenous contrast was administered.  COMPARISON:  Lumbar MRI 07/31/2019.  Report of 01/04/2019 chest x-ray (no images available).  FINDINGS: Limited cervical spine imaging: Evidence of chronic disc and endplate degeneration at C4-C5.  Thoracic spine segmentation: Appears to be normal, and this is the same numbering system used on the lumbar MRI last year.  Alignment:  Mild levoconvex lumbar scoliosis.  No spondylolisthesis.  Vertebrae: No marrow edema or evidence of acute osseous abnormality. Visualized bone marrow signal is within normal limits.  Cord: Thoracic spinal cord signal remains normal. Fairly capacious thoracic spinal canal, and no thoracic spinal stenosis except at T9-T10, detailed below. The conus medullaris appears to remain normal at L1-L2.  Paraspinal and other soft tissues: Multiple rounded  areas of abnormal signal are suggested in the left lung. Some of these might be artifact (series 20, images 10, 18, 23). However one left lung subpleural nodular area is also visible on all 3 sagittal images (series 18, image 17).  No evidence of pleural effusion. No mediastinal lymphadenopathy is evident.  Grossly negative visible upper abdominal viscera. The thoracic paraspinal soft tissues remain within normal limits.  Disc levels:  T1-T2: Negative.  T2-T3: Negative.  T3-T4: Negative.  T4-T5: Negative.  T5-T6: Mild facet hypertrophy on the right. Mild right T5 foraminal stenosis.  T6-T7: Subtle left paracentral disc protrusion.  No stenosis.  T7-T8: Mild disc bulging.  No stenosis.  T8-T9: Negative.  T9-T10: Moderate size left paracentral disc extrusion (series 20, image 29 and series 18, image 10). Effaced left ventral CSF space. Borderline to mild spinal stenosis. Moderate facet hypertrophy at this level greater on the right. Mild bilateral T9 foraminal stenosis.  T10-T11: Circumferential disc bulge. Mild facet hypertrophy. Mild left T10 foraminal stenosis.  T11-T12: Mild left facet hypertrophy.  No stenosis.  T12-L1: Negative.  IMPRESSION: 1. Appearance suspicious for multiple left lung pulmonary nodules, individually up to 15-20 mm. These might be related to the reported history of Wegener's granulomatosis, but follow-up chest radiographs are recommended - preferably to be performed at [facility] where they can most easily be compared to those on 01/04/2019.  2. Thoracic spine disc herniation at T9-T10 results in borderline to mild spinal stenosis. Thoracic spinal cord remains normal.  3. Intermittent mild degenerative thoracic neural foraminal stenosis, including at the bilateral T9 nerve levels.  Electronically Signed   By: Genevie Ann M.D.   On: 02/25/2020 15:53  Assessment  The primary encounter diagnosis was Lumbosacral  radiculopathy at L5 (left). Diagnoses of Lumbar radiculopathy, chronic (LEFT), Chronic radicular lumbar pain, Spinal stenosis of lumbar region without neurogenic claudication, and Chronic pain syndrome  were also pertinent to this visit.  Plan of Care  [name] has a current medication list which includes the following long-term medication(s): albuterol, azelastine, bumetanide, calcium carbonate, simponi, metolazone, montelukast, pantoprazole, pregabalin, promethazine, sotalol af, and spironolactone.  I discussed  percutaneous spinal cord stimulator trial with the patient in detail. I explained to the patient that they will have an external power source and programmer which the patient will use for 7 days. There will be daily communication with the stimulator company and the patient. A possible need for a mid-trial clinic visit to give the patient the best chance of success.     Patient is status post psychological assessment with Dr. Lyman Speller.  She has been cleared from a psychological standpoint for a spinal cord stimulator trial/implant.  Some of patient's pain does seem to be mechanical in nature, with some component of neurogenic pain as well. We discussed the indications for spinal cord stimulation, specifically stating that it is typically better for neuropathic and appendicular pain, but that we have had some success in the treatment of low back and hip pain.   Patient is interested in proceeding with spinal cord stimulation trial. [facility] understands that this may not be successful, and that spinal cord stimulation in general is not a "magic bullet."   We had a lengthy and very detailed discussion of all the risks, benefits, alternatives, and rationale of surgery as well as the option of continuing nonsurgical therapies. We specifically discussed the risks of temporary or permanent worsened neurologic injury, no symptomatic relief or pain made worse after procedure, and also the need for  future surgery (due to infection, CSF leak, bleeding, adjacent segment issues, bone-healing difficulties, and other related issues). No guarantees of outcome were made or implied and he is eager to proceed and presents for definitive treatment.   I also discussed the patient's thoracic MRI with her and explained how her T9-T10 mild spinal stenosis carries slightly additional risk as we advance her percutaneous epidural electrodes.  I informed the patient that if she did have moderate or severe thoracic canal stenosis that I would not feel comfortable offering her a trial but given her thoracic MRI findings of borderline to mild T9-T10 spinal stenosis, it is reasonable to consider percutaneous spinal cord stimulator trial with the patient understanding her increased risk.  Patient understands and wishes to proceed.  I instructed the patient to come to our clinic to get a soap solution to cleanse her lumbar spine prior to her spinal cord stimulator trial.  Issues concerning treatment and diagnosis were discussed with the patient. There are no barriers to understanding the plan of treatment. Explanation was well received by patient and/or family who then verbalized understanding.   Orders:  Orders Placed This Encounter  Procedures  . [place] representative to notify them of the scheduled case and to make sure they will be available to provide required equipment.    Standing Status:   Future    Standing Expiration Date:   09/11/2020    Scheduling Instructions:     Side: Bilateral     Level: Lumbar     Device: [employer]     Sedation: With sedation     Timeframe: As soon as pre-approved    Order Specific Question:   Where will this procedure be performed?    Answer:   [facility]   Follow-up plan:   Return in about 2 weeks (around  03/25/2020) for [employer] SCS trial (2.5 hrs blocked).    Recent Visits Date Type Provider Dept  01/30/20 Office Visit  Gillis Santa, MD [facility]  01/29/20 Office Visit Gillis Santa, MD [facility]  Showing recent visits within past 90 days and meeting all other requirements   Today's Visits Date Type Provider Dept  03/11/20 Office Visit Gillis Santa, MD [facility]  Showing today's visits and meeting all other requirements   Future Appointments No visits were found meeting these conditions.  Showing future appointments within next 90 days and meeting all other requirements   I discussed the assessment and treatment plan with the patient. The patient was provided an opportunity to ask questions and all were answered. The patient agreed with the plan and demonstrated an understanding of the instructions.  Patient advised to call back or seek an in-person evaluation if the symptoms or condition worsens.  Duration of encounter: 30 minutes.  Note by: Gillis Santa, MD Date: 03/11/2020; Time: 2:28 PM

## 2020-04-07 NOTE — Telephone Encounter (Signed)
Patient is aware I am trying to get prior auth for her trial.

## 2020-04-18 ENCOUNTER — Telehealth: Payer: Self-pay | Admitting: *Deleted

## 2020-04-23 ENCOUNTER — Other Ambulatory Visit: Payer: Self-pay | Admitting: Student in an Organized Health Care Education/Training Program

## 2020-04-23 DIAGNOSIS — M5416 Radiculopathy, lumbar region: Secondary | ICD-10-CM

## 2020-04-23 DIAGNOSIS — G894 Chronic pain syndrome: Secondary | ICD-10-CM

## 2020-05-19 ENCOUNTER — Telehealth: Payer: Self-pay

## 2020-05-19 NOTE — Telephone Encounter (Signed)
She is having an SCS Wednesday and has questions. Please call her.

## 2020-05-19 NOTE — Telephone Encounter (Signed)
All questions answered

## 2020-05-21 ENCOUNTER — Ambulatory Visit (HOSPITAL_BASED_OUTPATIENT_CLINIC_OR_DEPARTMENT_OTHER): Payer: Medicare Other | Admitting: Student in an Organized Health Care Education/Training Program

## 2020-05-21 ENCOUNTER — Ambulatory Visit
Admission: RE | Admit: 2020-05-21 | Discharge: 2020-05-21 | Disposition: A | Discharge status: Home / Self Care | Payer: Medicare Other | Source: Ambulatory Visit | Attending: Student in an Organized Health Care Education/Training Program | Admitting: Student in an Organized Health Care Education/Training Program

## 2020-05-21 ENCOUNTER — Encounter: Payer: Self-pay | Admitting: Student in an Organized Health Care Education/Training Program

## 2020-05-21 ENCOUNTER — Other Ambulatory Visit: Payer: Self-pay

## 2020-05-21 VITALS — BP 147/73 | HR 53 | Temp 97.5°F | Resp 15 | Ht 62.0 in | Wt 210.0 lb

## 2020-05-21 DIAGNOSIS — M5416 Radiculopathy, lumbar region: Secondary | ICD-10-CM | POA: Diagnosis present

## 2020-05-21 DIAGNOSIS — M5417 Radiculopathy, lumbosacral region: Secondary | ICD-10-CM | POA: Diagnosis present

## 2020-05-21 MED ORDER — LIDOCAINE HCL 2 % IJ SOLN
20.0000 mL | Freq: Once | INTRAMUSCULAR | Status: AC
  Administered 2020-05-21: 400 mg

## 2020-05-21 MED ORDER — ROPIVACAINE HCL 2 MG/ML IJ SOLN
INTRAMUSCULAR | Status: AC
  Filled 2020-05-21: qty 10

## 2020-05-21 MED ORDER — MIDAZOLAM HCL 5 MG/5ML IJ SOLN
INTRAMUSCULAR | Status: AC
  Filled 2020-05-21: qty 5

## 2020-05-21 MED ORDER — FENTANYL CITRATE (PF) 100 MCG/2ML IJ SOLN
25.0000 ug | INTRAMUSCULAR | Status: AC | PRN
  Administered 2020-05-21 (×2): 25 ug via INTRAVENOUS

## 2020-05-21 MED ORDER — FENTANYL CITRATE (PF) 100 MCG/2ML IJ SOLN
INTRAMUSCULAR | Status: AC
  Filled 2020-05-21: qty 2

## 2020-05-21 MED ORDER — MIDAZOLAM HCL 5 MG/5ML IJ SOLN
1.0000 mg | INTRAMUSCULAR | Status: DC | PRN
  Administered 2020-05-21: 1 mg via INTRAVENOUS

## 2020-05-21 MED ORDER — CEPHALEXIN 500 MG PO CAPS: 500.0000 mg | ORAL_CAPSULE | Freq: Four times a day (QID) | ORAL | 0 refills | Status: AC

## 2020-05-21 MED ORDER — CEFAZOLIN SODIUM 1 G IJ SOLR
INTRAMUSCULAR | Status: AC
  Filled 2020-05-21: qty 20

## 2020-05-21 MED ORDER — LIDOCAINE HCL 2 % IJ SOLN
INTRAMUSCULAR | Status: AC
  Filled 2020-05-21: qty 20

## 2020-05-21 MED ORDER — IOHEXOL 180 MG/ML  SOLN
10.0000 mL | Freq: Once | INTRAMUSCULAR | Status: AC
  Administered 2020-05-21: 10 mL via EPIDURAL

## 2020-05-21 MED ORDER — ROPIVACAINE HCL 2 MG/ML IJ SOLN
9.0000 mL | Freq: Once | INTRAMUSCULAR | Status: AC
  Administered 2020-05-21: 9 mL via PERINEURAL

## 2020-05-21 NOTE — Progress Notes (Signed)
Safety precautions to be maintained throughout the outpatient stay will include: orient to surroundings, keep bed in low position, maintain call bell within reach at all times, provide assistance with transfer out of bed and ambulation.  

## 2020-05-21 NOTE — Patient Instructions (Signed)
Today we did the following  -We have done a Spinal Cord Stimulator Trial with [employer]  -As long as the leads are in place, do not bathe or shower. You may sponge bathe.  -While the lead is in place, please limit the bending, lifting, or twisting because the lead can move.  -The things we want to see is if your pain improves (and by what percentage), if you can do more activity (don't overdo it), and if you can use less of your "as needed" medicine. Do not stop long acting medicines like methadone, oxycontin, MS Contin, etc without checking with [place].  -It is VERY important that you pick up the antibiotics we prescribed, Keflex, on your way home from the trial and take them as prescribed(4 times a day), starting today, for as long as the lead is in place.  -The Spina Cord Stimulator Representative will be in contact with you while the lead is in place to make sure the trial goes as well as possible.  -Please contact us with any questions or concerns at any time during the trial.   -If you start running a fever over 100 degrees, have severe back pain, or new pain running down the legs, or drainage coming from the lead site, contact us immediately and/or go to the emergency room.  -Please do not restart any sort of medication that can thin your blood such as Aspirin, ibuprofen, motrin, aleve, plavix, coumadin, etc. If you aren't sure, call and ask.  -We will have you return next week to have the lead removed.

## 2020-05-21 NOTE — Progress Notes (Signed)
PROVIDER NOTE: Information contained herein reflects review and annotations entered in association with encounter. Interpretation of such information and data should be left to medically-trained personnel. Information provided to patient can be located elsewhere in the medical record under "Patient Instructions". Document created using STT-dictation technology, any transcriptional errors that may result from process are unintentional.    Patient: Cassandra Brown  Service Category: Procedure  Provider: Gillis Santa, MD  DOB: 06-22-1966  DOS: 05/21/2020  Location: [facility]  MRN: [id number]  Setting: Ambulatory - outpatient  Referring Provider: Baxter Hire, MD  Type: Established Patient  Specialty: Interventional Pain Management  PCP: Baxter Hire, MD   Primary Reason for Admission: Surgical management of chronic pain condition.  Procedure:  Anesthesia, Analgesia, Anxiolysis:  Type: Trial Spinal Cord Neurostimulator Implant (Percutaneous, interlaminar, posterior epidural placement) Purpose: To determine if a permanent implant may be effective in controlling some or all of Ms. Borjon's chronic pain symptoms.  Region: Lumbar Level: T12-L1 Laterality: Bilateral Paramedial  Type: Moderate (Conscious) Sedation combined with Local Anesthesia Indication(s): Analgesia and Anxiety Route: Intravenous (IV) IV Access: Secured Sedation: Meaningful verbal contact was maintained at all times during the procedure  Local Anesthetic: Lidocaine 1-2%   Indications: 1. Lumbar radiculopathy, chronic (LEFT)   2. Lumbosacral radiculopathy at L5 (left)    Pain Score: Pre-procedure: 7 /10 Post-procedure: 7 /10   Pre-op Assessment:  Cassandra Brown is a 54 y.o. (year old), female patient, seen today for interventional treatment. She  has a past surgical history that includes Joint replacement (Right); Knee surgery (Left); and Shoulder surgery (Bilateral).  Initial Vital Signs:  Pulse/EKG  Rate: 66ECG Heart Rate: 60 Temp: (!) 97.2 F (36.2 C) Resp: 18 BP: (!) 156/90 SpO2: 98 %  BMI: Estimated body mass index is 38.41 kg/m as calculated from the following:   Height as of this encounter: 5\' 2"  (1.575 m).   Weight as of this encounter: 210 lb (95.3 kg).  Risk Assessment: Allergies: Reviewed. She is allergic to cyclobenzaprine; ibuprofen; morphine; meperidine; sulfa antibiotics; and onion.  Allergy Precautions: None required Coagulopathies: Reviewed. None identified.  Blood-thinner therapy: None at this time Active Infection(s): Reviewed. None identified. Cassandra Brown is afebrile  Site Confirmation: [name] was asked to confirm the procedure and laterality before marking the site, which she did. Procedure checklist: Completed Consent: Before the procedure and under the influence of no sedative(s), amnesic(s), or anxiolytics, the patient was informed of the treatment options, risks and possible complications. To fulfill our ethical and legal obligations, as recommended by the American Medical Association's Code of Ethics, I have informed the patient of my clinical impression; the nature and purpose of the treatment or procedure; the risks, benefits, and possible complications of the intervention; the alternatives, including doing nothing; the risk(s) and benefit(s) of the alternative treatment(s) or procedure(s); and the risk(s) and benefit(s) of doing nothing.  [name] was provided with information about the general risks and possible complications associated with most interventional procedures. These include, but are not limited to: failure to achieve desired goals, infection, bleeding, organ or nerve damage, allergic reactions, paralysis, and/or death.  In addition, she was informed of those risks and possible complications associated to this particular procedure, which include, but are not limited to: damage to the implant; failure to decrease pain; local, systemic, or  serious CNS infections, intraspinal abscess with possible cord compression and paralysis, or life-threatening such as meningitis; intrathecal and/or epidural bleeding with formation of hematoma with possible spinal  cord compression and permanent paralysis; organ damage; nerve injury or damage with subsequent sensory, motor, and/or autonomic system dysfunction, resulting in transient or permanent pain, numbness, and/or weakness of one or several areas of the body; allergic reactions, either minor or major life-threatening, such as anaphylactic or anaphylactoid reactions.  Furthermore, [name] was informed of those risks and complications associated with the medications. These include, but are not limited to: allergic reactions (i.e.: anaphylactic or anaphylactoid reactions); arrhythmia;  Hypotension/hypertension; cardiovascular collapse; respiratory depression and/or shortness of breath; swelling or edema; medication-induced neural toxicity; particulate matter embolism and blood vessel occlusion with resultant organ, and/or nervous system infarction and permanent paralysis.  Finally, she was informed that Medicine is not an exact science; therefore, there is also the possibility of unforeseen or unpredictable risks and/or possible complications that may result in a catastrophic outcome. The patient indicated having understood very clearly. We have given the patient no guarantees and we have made no promises. Enough time was given to the patient to ask questions, all of which were answered to the patient's satisfaction. [name] has indicated that she wanted to continue with the procedure. Attestation: I, the ordering provider, attest that I have discussed with the patient the benefits, risks, side-effects, alternatives, likelihood of achieving goals, and potential problems during recovery for the procedure that I have provided informed consent. Date  Time: 05/21/2020  7:48 AM  Pre-Procedure Preparation:   Monitoring: As per clinic protocol. Respiration, ETCO2, SpO2, BP, heart rate and rhythm monitor placed and checked for adequate function Safety Precautions: Patient was assessed for positional comfort and pressure points before starting the procedure. Time-out: I initiated and conducted the "Time-out" before starting the procedure, as per protocol. The patient was asked to participate by confirming the accuracy of the "Time Out" information. Verification of the correct person, site, and procedure were performed and confirmed by me, the nursing staff, and the patient. "Time-out" conducted as per Joint Commission's Universal Protocol (UP.01.01.01). Time: 0829  Description of Procedure Process:   Position: Prone Target Area: Posterior epidural space Approach: Posterior percutaneous, paramedial, interlaminar approach Area Prepped: Bilateral thoraco-lumbar Region Prepping solution: ChloraPrep (2% chlorhexidine gluconate and 70% isopropyl alcohol) Safety Precautions: Safe injection practices and needle disposal techniques used. Medications properly checked for expiration dates. SDV (single dose vial) medications used. Aspiration looking for blood return and/or CSF was conducted prior to all injections. At no point did I inject any substances, as a needle was being advanced. No attempts were made at seeking any paresthesias.  Description of the Procedure: Availability of a responsible, adult driver, and NPO status confirmed. Informed consent was obtained after having discussed risks and possible complications. An IV was started. The patient was then taken to the fluoroscopy suite, where the patient was placed in position for the procedure, over the fluoroscopy table. The patient was then monitored in the usual manner. Fluoroscopy was manipulated to obtain the best possible view of the target. Parallex error was corrected before commencing the procedure. Once a clear view of the target had been obtained, the  skin and deeper tissues over the procedure site were infiltrated using lidocaine, loaded in a 10 cc luer-loc syringe with a 0.5 inch, 25-G needle. The introducer needle(s) was/were then inserted through the skin and deeper tissues. A paramidline approach was used to enter the posterior epidural space at a 30 angle, using "Loss-of-resistance Technique" with 3 ml of PF-NaCl (0.9% NSS). Correct needle placement was confirmed in the antero-posterior and lateral fluoroscopic views. The  lead was gently introduced and manipulated under real-time fluoroscopy, constantly assessing for pain, discomfort, or paresthesias, until the tip rested at the desired level. Both sides were done in identical fashion. Electrode placement was tested until appropriate coverage was attained. Once the patient confirmed that the stimulation was over the desired area, the lead(s) was/were secured in place and the introducer needles removed. This was done under real-time fluoroscopy while observing the electrode tip to avoid unintended migration. The area was covered with a non-occlusive dressing and the patient transported to recovery for further programming.  Vitals:   05/21/20 0915 05/21/20 0925 05/21/20 0935 05/21/20 0945  BP: 138/84 140/76 139/86 (!) 147/73  Pulse: (!) 53     Resp: 17 19 16 15   Temp:  (!) 97.5 F (36.4 C)    TempSrc:      SpO2: 96% 98% 98% 98%  Weight:      Height:       Start Time: 0829 hrs. End Time: 0915(this time is also with the securing of site.) hrs.  Neurostimulator Details:   Lead(s):  Brand: [employer] Epidural Access Level:  T12-L1 T12-L1  Lead implant:  Bilateral   Laterality:  Left Right  Top electrode location:  T7-8 (inferior T7) T8 (superior T8)  Model No.: [id number] Same  Length: 60 cm Same  Lot No.:   [id number]  MRI compatibility:  Yes           External Neurostimulator    Model No.: [id number]   Serial No.: [id number] N    Imaging Guidance (Spinal):          Type of  Imaging Technique: Fluoroscopy Guidance (Spinal) Indication(s): Assistance in needle guidance and placement for procedures requiring needle placement in or near specific anatomical locations not easily accessible without such assistance. Exposure Time: Please see nurses notes. Contrast: None used. Fluoroscopic Guidance: I was personally present during the use of fluoroscopy. "Tunnel Vision Technique" used to obtain the best possible view of the target area. Parallax error corrected before commencing the procedure. "Direction-depth-direction" technique used to introduce the needle under continuous pulsed fluoroscopy. Once target was reached, antero-posterior, oblique, and lateral fluoroscopic projection used confirm needle placement in all planes. Images permanently stored in EMR. Interpretation: No contrast injected. I personally interpreted the imaging intraoperatively. Adequate needle placement confirmed in multiple planes. Permanent images saved into the patient's record.     Antibiotic Prophylaxis:   Anti-infectives (From admission, onward)   Start     Dose/Rate Route Frequency Ordered Stop   05/21/20 0000  cephALEXin (KEFLEX) 500 MG capsule     500 mg Oral 4 times daily 05/21/20 0811 05/28/20 2359     Indication(s): Procedural Prophylaxis.  Post-operative Assessment:  Post-procedure Vital Signs:  Pulse/HCG Rate: (!) 53(!) 55 Temp: (!) 97.5 F (36.4 C) Resp: 15 BP: (!) 147/73 SpO2: 98 %  Complications: No immediate post-treatment complications observed by team, or reported by patient.  Note: The patient tolerated the entire procedure well. A repeat set of vitals were taken after the procedure and the patient was kept under observation following institutional policy, for this type of procedure. Post-procedural neurological assessment was performed, showing return to baseline, prior to discharge. The patient was provided with post-procedure discharge instructions, including a section  on how to identify potential problems. Should any problems arise concerning this procedure, the patient was given instructions to immediately contact us, at any time, without hesitation. In any case, we plan to contact the patient by telephone for a follow-up  status report regarding this interventional procedure.  Comments:  No additional relevant information.  Plan of Care  Orders:  Orders Placed This Encounter  Procedures  . DG PAIN [facility] C-ARM 1-60 MIN NO REPORT    Intraoperative interpretation by procedural physician at [facility].    Standing Status:   Standing    Number of Occurrences:   1    Order Specific Question:   Reason for exam:    Answer:   Assistance in needle guidance and placement for procedures requiring needle placement in or near specific anatomical locations not easily accessible without such assistance.   Medications administered: We administered iohexol, lidocaine, fentaNYL, midazolam, and ropivacaine (PF) 2 mg/mL (0.2%).  See the medical record for exact dosing, route, and time of administration.  Follow-up plan:   Return in about 1 week (around 05/28/2020) for SCS lead pull.     Recent Visits Date Type Provider Dept  03/11/20 Office Visit Gillis Santa, MD [facility]  Showing recent visits within past 90 days and meeting all other requirements   Today's Visits Date Type Provider Dept  05/21/20 Procedure visit Gillis Santa, MD [facility]  Showing today's visits and meeting all other requirements   Future Appointments Date Type Provider Dept  05/28/20 Appointment Gillis Santa, MD [facility]  Showing future appointments within next 90 days and meeting all other requirements   Disposition: Discharge home  Discharge (Date  Time): 05/21/2020; 1006(SCS instructions given to patient and friend by Manuela Schwartz [employer].  Both states understanding) hrs.   Primary Care Physician: Baxter Hire, MD Location: [facility]  Outpatient Pain Management Facility Note by: Gillis Santa, MD Date: 05/21/2020; Time: 10:27 AM

## 2020-05-22 ENCOUNTER — Telehealth: Payer: Self-pay

## 2020-05-22 NOTE — Telephone Encounter (Signed)
Pt was called and no problem reported. °

## 2020-05-27 ENCOUNTER — Ambulatory Visit: Payer: Medicare Other | Admitting: Dermatology

## 2020-05-28 ENCOUNTER — Ambulatory Visit
Payer: Medicare Other | Attending: Student in an Organized Health Care Education/Training Program | Admitting: Student in an Organized Health Care Education/Training Program

## 2020-05-28 ENCOUNTER — Other Ambulatory Visit: Payer: Self-pay

## 2020-05-28 ENCOUNTER — Ambulatory Visit: Admission: RE | Admit: 2020-05-28 | Payer: Medicare Other | Source: Ambulatory Visit

## 2020-05-28 ENCOUNTER — Encounter: Payer: Self-pay | Admitting: Student in an Organized Health Care Education/Training Program

## 2020-05-28 VITALS — BP 160/83 | HR 95 | Temp 97.3°F | Resp 16 | Ht 62.0 in | Wt 210.0 lb

## 2020-05-28 DIAGNOSIS — M5416 Radiculopathy, lumbar region: Secondary | ICD-10-CM | POA: Diagnosis present

## 2020-05-28 DIAGNOSIS — M5417 Radiculopathy, lumbosacral region: Secondary | ICD-10-CM | POA: Diagnosis present

## 2020-05-28 DIAGNOSIS — G894 Chronic pain syndrome: Secondary | ICD-10-CM | POA: Diagnosis present

## 2020-05-28 NOTE — Progress Notes (Signed)
PROVIDER NOTE: Information contained herein reflects review and annotations entered in association with encounter. Interpretation of such information and data should be left to medically-trained personnel. Information provided to patient can be located elsewhere in the medical record under "Patient Instructions". Document created using STT-dictation technology, any transcriptional errors that may result from process are unintentional.    Patient: Cassandra Brown  Service Category: E/M  Provider: Gillis Santa, MD  DOB: October 09, 1966  DOS: 05/28/2020  Referring Provider: Baxter Hire, MD  MRN: [id number]  Setting: Ambulatory outpatient  PCP: Baxter Hire, MD  Type: Established Patient  Specialty: Interventional Pain Management    Location: Office  Delivery: Face-to-face     Primary Reason(s) for Visit: Encounter for post-procedure evaluation of chronic illness with mild to moderate exacerbation CC: Back Pain  HPI  Ms. Chamorro is a 54 y.o. year old, female patient, who comes today for a post-procedure evaluation. 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 Her primarily concern today is the Back Pain  Pain Assessment: Location: Lower Back Radiating: occaionally Onset: More than a month ago Duration: Chronic pain Quality: Aching Severity: 4 /10 (subjective, self-reported pain  score)  Note: Reported level is compatible with observation.                         When using our objective Pain Scale, levels between 6 and 10/10 are said to belong in an emergency room, as it progressively worsens from a 6/10, described as severely limiting, requiring emergency care not usually available at an outpatient pain management facility. At a 6/10 level, communication becomes difficult and requires great effort. Assistance to reach the emergency department may be required. Facial flushing and profuse sweating along with potentially dangerous increases in heart rate and blood pressure will be evident. Effect on ADL: i have been able to sleep, activity level has improved Timing: Constant Modifying factors: scs BP: (!) 160/83   HR: 95  Ms. Tricarico comes in today for post-procedure evaluation.  Further details on both, my assessment(s), as well as the proposed treatment plan, please see below.  Post-Procedure Assessment  05/21/2020 Procedure: Medtronic dual lead spinal cord stimulator trial  Trial leads were removed with tip intact.  Current benefits: Defined as reported results that persistent at this point in time.   Analgesia: 75-100 %            Function: Ms. Mattila reports improvement in function ROM: Ms. Parlow reports improvement in ROM Successful spinal cord stimulator trial  Interpretation: Results would suggest Successful spinal cord stimulator trial.  We will send to Dr. Lacinda Axon for permanent implant.                  Plan:  Please see "Plan of Care" for details.                Laboratory Chemistry Profile   Renal No results found for: BUN, CREATININE, LABCREA, BCR, GFR, GFRAA, GFRNONAA, SPECGRAV, PHUR, PROTEINUR   Electrolytes No results found for:  NA, K, CL, CALCIUM, MG, PHOS   Hepatic No results found for: AST, ALT, ALBUMIN, ALKPHOS, AMYLASE, LIPASE, AMMONIA   ID No results found for: LYMEIGGIGMAB, HIV, SARSCOV2NAA, STAPHAUREUS, MRSAPCR, HCVAB, PREGTESTUR,  RMSFIGG, QFVRPH1IGG, QFVRPH2IGG, LYMEIGGIGMAB   Bone No results found for: VD25OH, IW580DX8PJA, SN0539JQ7, HA1937TK2, 25OHVITD1, 25OHVITD2, 25OHVITD3, TESTOFREE, TESTOSTERONE   Endocrine No results found for: GLUCOSE, GLUCOSEU, HGBA1C, TSH, FREET4, TESTOFREE, TESTOSTERONE, SHBG, ESTRADIOL, ESTRADIOLPCT, ESTRADIOLFRE, LABPREG, ACTH, CRTSLPL, UCORFRPERLTR, UCORFRPERDAY, CORTISOLBASE, LABPREG   Neuropathy No results found for: VITAMINB12, FOLATE, HGBA1C, HIV   CNS No results found for: COLORCSF, APPEARCSF, RBCCOUNTCSF, WBCCSF, POLYSCSF, LYMPHSCSF, EOSCSF, PROTEINCSF, GLUCCSF, JCVIRUS, CSFOLI, IGGCSF, LABACHR, ACETBL, LABACHR, ACETBL   Inflammation (CRP: Acute   ESR: Chronic) No results found for: CRP, ESRSEDRATE, LATICACIDVEN   Rheumatology No results found for: RF, ANA, LABURIC, URICUR, LYMEIGGIGMAB, LYMEABIGMQN, HLAB27   Coagulation No results found for: INR, LABPROT, APTT, PLT, DDIMER, LABHEMA, VITAMINK1, AT3   Cardiovascular No results found for: BNP, CKTOTAL, CKMB, TROPONINI, HGB, HCT, LABVMA, EPIRU, EPINEPH24HUR, NOREPRU, NOREPI24HUR, DOPARU, DOPAM24HRUR   Screening No results found for: SARSCOV2NAA, COVIDSOURCE, STAPHAUREUS, MRSAPCR, HCVAB, HIV, PREGTESTUR   Cancer No results found for: CEA, CA125, LABCA2   Allergens No results found for: ALMOND, APPLE, ASPARAGUS, AVOCADO, BANANA, BARLEY, BASIL, BAYLEAF, GREENBEAN, LIMABEAN, WHITEBEAN, BEEFIGE, REDBEET, BLUEBERRY, BROCCOLI, CABBAGE, MELON, CARROT, CASEIN, CASHEWNUT, CAULIFLOWER, CELERY     Note: Lab results reviewed.   Recent Imaging Results  No results found for this or any previous visit. Interpretation Report: Fluoroscopy was used during the procedure to assist with needle guidance. The images were interpreted intraoperatively by the requesting physician.        Meds   Current Outpatient Medications:    albuterol (VENTOLIN HFA) 108 (90 Base) MCG/ACT inhaler, Inhale into the lungs., Disp: , Rfl:    aspirin EC 81 MG  tablet, Take 81 mg by mouth daily., Disp: , Rfl:    azelastine (ASTELIN) 0.1 % nasal spray, Place into the nose., Disp: , Rfl:    bumetanide (BUMEX) 2 MG tablet, Take by mouth daily., Disp: , Rfl:    calcium carbonate (OSCAL) 1500 (600 Ca) MG TABS tablet, Take 1,500 mg by mouth 2 (two) times daily with a meal., Disp: , Rfl:    carisoprodol (SOMA) 350 MG tablet, Take 350 mg by mouth 3 (three) times daily., Disp: , Rfl:    folic acid (FOLVITE) 1 MG tablet, Take 1 mg by mouth daily., Disp: , Rfl:    gentamicin ointment (GARAMYCIN) 0.1 %, as needed, Disp: , Rfl:    Golimumab (SIMPONI) 50 MG/0.5ML SOAJ, Inject 50 mg into the skin every 30 (thirty) days., Disp: , Rfl:    HYDROcodone-acetaminophen (NORCO) 10-325 MG tablet, Take 1 tablet by mouth every 6 (six) hours as needed., Disp: , Rfl:    methotrexate (50 MG/ML) 1 g injection, Inject 25 mg into the vein once a week., Disp: , Rfl:    metolazone (ZAROXOLYN) 5 MG tablet, Take by mouth., Disp: , Rfl:    montelukast (SINGULAIR) 10 MG tablet, Take 10 mg by mouth at bedtime., Disp: , Rfl:    pantoprazole (PROTONIX) 40 MG tablet, Take 40 mg by mouth daily., Disp: , Rfl:    pentoxifylline (TRENTAL) 400 MG CR tablet, Take 400 mg by mouth 3 (three) times daily with meals., Disp: , Rfl:    predniSONE (DELTASONE) 5 MG tablet, Take 5 mg by mouth daily with breakfast., Disp: , Rfl:    pregabalin (LYRICA) 25 MG capsule, Take 50  mg by mouth 3 (three) times daily. , Disp: , Rfl:    promethazine (PHENERGAN) 25 MG tablet, Take 25 mg by mouth every 8 (eight) hours as needed for nausea or vomiting., Disp: , Rfl:    sotalol AF (BETAPACE AF) 80 MG TABS tablet, Take 80 mg by mouth 2 (two) times daily., Disp: , Rfl:    Specialty Vitamins Products (CENTRUM PERFORMANCE) TABS, Take 1 tablet by mouth daily., Disp: , Rfl:    spironolactone (ALDACTONE) 25 MG tablet, Take 25 mg by mouth daily., Disp: , Rfl:    triamcinolone cream (KENALOG) 0.1 %, Apply  topically., Disp: , Rfl:    cephALEXin (KEFLEX) 500 MG capsule, Take 1 capsule (500 mg total) by mouth 4 (four) times daily for 7 days. (Patient not taking: Reported on 05/28/2020), Disp: 28 capsule, Rfl: 0  ROS  Constitutional: Denies any fever or chills Gastrointestinal: No reported hemesis, hematochezia, vomiting, or acute GI distress Musculoskeletal: Denies any acute onset joint swelling, redness, loss of ROM, or weakness Neurological: No reported episodes of acute onset apraxia, aphasia, dysarthria, agnosia, amnesia, paralysis, loss of coordination, or loss of consciousness  Allergies  Ms. Currier is allergic to cyclobenzaprine; ibuprofen; morphine; meperidine; sulfa antibiotics; and onion.  [place]  Drug: Ms. Islam  reports no history of drug use. Alcohol:  reports previous alcohol use. Tobacco:  reports that she has quit smoking. She has never used smokeless tobacco. Medical:  has a past medical history of CHF (congestive heart failure) ([place]), GERD (gastroesophageal reflux disease), Rheumatoid arthritis ([place]), and Vasculitis ([place]). Surgical: Ms. Thoma  has a past surgical history that includes Joint replacement (Right); Knee surgery (Left); and Shoulder surgery (Bilateral). Family: family history is not on file.  Constitutional Exam  General appearance: Well nourished, well developed, and well hydrated. In no apparent acute distress Vitals:   05/28/20 1307  BP: (!) 160/83  Pulse: 95  Resp: 16  Temp: (!) 97.3 F (36.3 C)  SpO2: 98%  Weight: 210 lb (95.3 kg)  Height: 5' 2"  (1.575 m)   BMI Assessment: Estimated body mass index is 38.41 kg/m as calculated from the following:   Height as of this encounter: 5' 2"  (1.575 m).   Weight as of this encounter: 210 lb (95.3 kg).  BMI interpretation table: BMI level Category Range association with higher incidence of chronic pain  <18 kg/m2 Underweight   18.5-24.9 kg/m2 Ideal body weight   25-29.9 kg/m2 Overweight Increased incidence  by 20%  30-34.9 kg/m2 Obese (Class I) Increased incidence by 68%  35-39.9 kg/m2 Severe obesity (Class II) Increased incidence by 136%  >40 kg/m2 Extreme obesity (Class III) Increased incidence by 254%   Patient's current BMI Ideal Body weight  Body mass index is 38.41 kg/m. Ideal body weight: 50.1 kg (110 lb 7.2 oz) Adjusted ideal body weight: 68.2 kg (150 lb 4.3 oz)   BMI Readings from Last 4 Encounters:  05/28/20 38.41 kg/m  05/21/20 38.41 kg/m  01/28/20 37.31 kg/m   Wt Readings from Last 4 Encounters:  05/28/20 210 lb (95.3 kg)  05/21/20 210 lb (95.3 kg)  01/28/20 204 lb (92.5 kg)    Psych/Mental status: Alert, oriented x 3 (person, place, & time)       Eyes: PERLA Respiratory: No evidence of acute respiratory distress  Cervical Spine Exam  Skin & Axial Inspection: No masses, redness, edema, swelling, or associated skin lesions Alignment: Symmetrical Functional ROM: Unrestricted ROM      Stability: No instability detected Muscle Tone/Strength: Functionally  intact. No obvious neuro-muscular anomalies detected. Sensory (Neurological): Unimpaired Palpation: No palpable anomalies              Upper Extremity (UE) Exam    Side: Right upper extremity  Side: Left upper extremity  Skin & Extremity Inspection: Skin color, temperature, and hair growth are WNL. No peripheral edema or cyanosis. No masses, redness, swelling, asymmetry, or associated skin lesions. No contractures.  Skin & Extremity Inspection: Skin color, temperature, and hair growth are WNL. No peripheral edema or cyanosis. No masses, redness, swelling, asymmetry, or associated skin lesions. No contractures.  Functional ROM: Unrestricted ROM          Functional ROM: Unrestricted ROM          Muscle Tone/Strength: Functionally intact. No obvious neuro-muscular anomalies detected.  Muscle Tone/Strength: Functionally intact. No obvious neuro-muscular anomalies detected.  Sensory (Neurological): Unimpaired           Sensory (Neurological): Unimpaired          Palpation: No palpable anomalies              Palpation: No palpable anomalies              Provocative Test(s):  Phalen's test: deferred Tinel's test: deferred Apley's scratch test (touch opposite shoulder):  Action 1 (Across chest): deferred Action 2 (Overhead): deferred Action 3 (LB reach): deferred   Provocative Test(s):  Phalen's test: deferred Tinel's test: deferred Apley's scratch test (touch opposite shoulder):  Action 1 (Across chest): deferred Action 2 (Overhead): deferred Action 3 (LB reach): deferred    Thoracic Spine Area Exam  Skin & Axial Inspection: No masses, redness, or swelling Alignment: Symmetrical Functional ROM: Unrestricted ROM Stability: No instability detected Muscle Tone/Strength: Functionally intact. No obvious neuro-muscular anomalies detected. Sensory (Neurological): Unimpaired Muscle strength & Tone: No palpable anomalies  Lumbar Exam  Skin & Axial Inspection: No masses, redness, or swelling Alignment: Symmetrical Functional ROM: Improved after treatment       Stability: No instability detected Muscle Tone/Strength: Functionally intact. No obvious neuro-muscular anomalies detected. Sensory (Neurological): Improved Palpation: No palpable anomalies       Provocative Tests: Hyperextension/rotation test: Improved after treatment       Lumbar quadrant test (Kemp's test): Improved after treatment       Lateral bending test: deferred today       Patrick's Maneuver: deferred today                   FABER* test: deferred today                   S-I anterior distraction/compression test: deferred today         S-I lateral compression test: deferred today         S-I Thigh-thrust test: deferred today         S-I Gaenslen's test: deferred today         *(Flexion, ABduction and External Rotation)  Gait & Posture Assessment  Ambulation: Unassisted Gait: Relatively normal for age and body habitus Posture:  WNL   Lower Extremity Exam    Side: Right lower extremity  Side: Left lower extremity  Stability: No instability observed          Stability: No instability observed          Skin & Extremity Inspection: Skin color, temperature, and hair growth are WNL. No peripheral edema or cyanosis. No masses, redness, swelling, asymmetry, or associated skin lesions. No contractures.  Skin &  Extremity Inspection: Skin color, temperature, and hair growth are WNL. No peripheral edema or cyanosis. No masses, redness, swelling, asymmetry, or associated skin lesions. No contractures.  Functional ROM: Unrestricted ROM                  Functional ROM: Unrestricted ROM                  Muscle Tone/Strength: Functionally intact. No obvious neuro-muscular anomalies detected.  Muscle Tone/Strength: Functionally intact. No obvious neuro-muscular anomalies detected.  Sensory (Neurological): Unimpaired        Sensory (Neurological): Unimpaired        DTR: Patellar: deferred today Achilles: deferred today Plantar: deferred today  DTR: Patellar: deferred today Achilles: deferred today Plantar: deferred today  Palpation: No palpable anomalies  Palpation: No palpable anomalies   Assessment   Status Diagnosis  Controlled Controlled Controlled 1. Lumbar radiculopathy, chronic (LEFT)   2. Lumbosacral radiculopathy at L5 (left)   3. Chronic pain syndrome      Patient presents today for follow-up for spinal cord stimulator trial lead removal.  She had a successful spinal cord stimulator trial with [employer].  She endorses greater than 75% pain relief in regards to her low back and left leg pain.  She states that she was able to ambulate a longer distance with less pain.  She endorses improvement in her quality of life and functional status.  For this reason, recommend permanent implant, will send referral to Dr. Lacinda Axon.  Plan of Care   Orders:  Orders Placed This Encounter  Procedures   Ambulatory referral to  Neurosurgery    Referral Priority:   Routine    Referral Type:   Surgical    Referral Reason:   Specialty Services Required    Referred to Provider:   Deetta Perla, MD    Requested Specialty:   Neurosurgery    Number of Visits Requested:   1    Referral Orders     Ambulatory referral to Neurosurgery Planned follow-up:   Return if symptoms worsen or fail to improve.    Recent Visits Date Type Provider Dept  05/21/20 Procedure visit Gillis Santa, MD [facility]  03/11/20 Office Visit Gillis Santa, MD [facility]  Showing recent visits within past 90 days and meeting all other requirements   Today's Visits Date Type Provider Dept  05/28/20 Procedure visit Gillis Santa, MD [facility]  Showing today's visits and meeting all other requirements   Future Appointments No visits were found meeting these conditions.  Showing future appointments within next 90 days and meeting all other requirements   Primary Care Physician: Baxter Hire, MD Location: [facility] Outpatient [facility] Management Facility Note by: Gillis Santa, MD Date: 05/28/2020; Time: 1:33 PM  Note: This dictation was prepared with Dragon dictation. Any transcriptional errors that may result from this process are unintentional.

## 2020-05-28 NOTE — Progress Notes (Signed)
Safety precautions to be maintained throughout the outpatient stay will include: orient to surroundings, keep bed in low position, maintain call bell within reach at all times, provide assistance with transfer out of bed and ambulation.  

## 2020-06-10 ENCOUNTER — Other Ambulatory Visit: Payer: Self-pay | Admitting: Neurosurgery

## 2020-06-17 ENCOUNTER — Other Ambulatory Visit
Admission: RE | Admit: 2020-06-17 | Discharge: 2020-06-17 | Disposition: A | Discharge status: Home / Self Care | Payer: Medicare Other | Source: Ambulatory Visit | Attending: Neurosurgery | Admitting: Neurosurgery

## 2020-06-17 ENCOUNTER — Other Ambulatory Visit: Payer: Self-pay

## 2020-06-17 HISTORY — DX: Chronic pain syndrome: G89.4

## 2020-06-17 HISTORY — DX: Patent foramen ovale: Q21.12

## 2020-06-17 HISTORY — DX: Spinal stenosis, lumbar region without neurogenic claudication: M48.061

## 2020-06-17 HISTORY — DX: Atrial septal defect: Q21.1

## 2020-06-17 HISTORY — DX: Personal history of urinary calculi: Z87.442

## 2020-06-17 HISTORY — DX: Angina pectoris, unspecified: I20.9

## 2020-06-17 HISTORY — DX: Unequal limb length (acquired), unspecified site: M21.70

## 2020-06-17 HISTORY — DX: Elevated white blood cell count, unspecified: D72.829

## 2020-06-17 HISTORY — DX: Chronic maxillary sinusitis: J32.0

## 2020-06-17 HISTORY — DX: Allergic rhinitis due to pollen: J30.1

## 2020-06-17 HISTORY — DX: Obesity, unspecified: E66.9

## 2020-06-17 HISTORY — DX: Gastroparesis: K31.84

## 2020-06-17 HISTORY — DX: Sleep apnea, unspecified: G47.30

## 2020-06-17 HISTORY — DX: Malformation of coronary vessels: Q24.5

## 2020-06-17 HISTORY — DX: 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: M31.30

## 2020-06-17 HISTORY — DX: Cardiac arrhythmia, unspecified: I49.9

## 2020-06-17 HISTORY — DX: Other chronic pain: G89.29

## 2020-06-17 NOTE — Patient Instructions (Signed)
COVID TESTING Date: June 19, 2020 Testing site:  [facility] [address] Thru Hours:  9:14 am - 1:00 pm Once you are tested, you are asked to stay quarantined (avoiding public places) until after your surgery.   Your procedure is scheduled on: Monday June 23, 2020 Report to Day Surgery on the 2nd floor of the [facility]. To find out your arrival time, please call [phone] between 1PM - 3PM on: Friday June 20, 2020  REMEMBER: Instructions that are not followed completely may result in serious medical risk, up to and including death; or upon the discretion of your surgeon and anesthesiologist your surgery may need to be rescheduled.  Do not eat food after midnight the night before surgery.  No gum chewing, lozengers or hard candies.  You may however, drink CLEAR liquids up to 2 hours before you are scheduled to arrive for your surgery. Do not drink anything within 2 hours of your scheduled arrival time.  Clear liquids include: - water  - apple juice without pulp - gatorade (not RED) - black coffee or tea (Do NOT add milk or creamers to the coffee or tea) Do NOT drink anything that is not on this list.  Type 1 and Type 2 diabetics should only drink water.   TAKE THESE MEDICATIONS THE MORNING OF SURGERY WITH A SIP OF WATER: LYRICA CARISOPRODOL PAIN PILL IF NEEDED PREDNISONE SOTALOL PANTOPRAZOLE  (take one the night before and one on the morning of surgery - helps to prevent nausea after surgery.)  Follow recommendations from Cardiologist, Pulmonologist or PCP regarding stopping Aspirin, Coumadin, Plavix, Eliquis, Pradaxa, or Pletal.  Stop Anti-inflammatories (NSAIDS) such as Advil, Aleve, Ibuprofen, Motrin, Naproxen, Naprosyn and Aspirin based products such as Excedrin, Goodys Powder, BC Powder. (May take Tylenol or Acetaminophen if needed.)  Stop ANY OVER THE COUNTER supplements until after surgery. (May continue Vitamin D, Vitamin B,  and multivitamin.)  No Alcohol for 24 hours before or after surgery.  No Smoking including e-cigarettes for 24 hours prior to surgery.  No chewable tobacco products for at least 6 hours prior to surgery.  No nicotine patches on the day of surgery.  Do not use any "recreational" drugs for at least a week prior to your surgery.  Please be advised that the combination of cocaine and anesthesia may have negative outcomes, up to and including death. If you test positive for cocaine, your surgery will be cancelled.  On the morning of surgery brush your teeth with toothpaste and water, you may rinse your mouth with mouthwash if you wish. Do not swallow any toothpaste or mouthwash.  Do not wear jewelry, make-up, hairpins, clips or nail polish.  Do not wear lotions, powders, or perfumes OR DEODORANT  Do not shave 48 hours prior to surgery.   Contact lenses, hearing aids and dentures may not be worn into surgery.  Do not bring valuables to the hospital. [facility] is not responsible for any missing/lost belongings or valuables.   Use CHG Soap on instruction sheet.  Bring your C-PAP to the hospital with you in case you may have to spend the night.   Notify your doctor if there is any change in your medical condition (cold, fever, infection).  Wear comfortable clothing (specific to your surgery type) to the hospital.  Plan for stool softeners for home use; pain medications have a tendency to cause constipation. You can also help prevent constipation by eating foods high in fiber such as  fruits and vegetables and drinking plenty of fluids as your diet allows.  After surgery, you can help prevent lung complications by doing breathing exercises.  Take deep breaths and cough every 1-2 hours. Your doctor may order a device called an Incentive Spirometer to help you take deep breaths. When coughing or sneezing, hold a pillow firmly against your incision with both hands. This is called  "splinting." Doing this helps protect your incision. It also decreases belly discomfort.  If you are being admitted to the hospital overnight, leave your suitcase in the car. After surgery it may be brought to your room.  If you are being discharged the day of surgery, you will not be allowed to drive home. You will need a responsible adult (18 years or older) to drive you home and stay with you that night.     Please call the [facility] at [phone] if you have any questions about these instructions.  Visitation Policy:  Patients undergoing a surgery or procedure may have one family member or support person with them as long as that person is not COVID-19 positive or experiencing its symptoms.  That person may remain in the waiting area during the procedure.  Children under 25 years of age may have both parents or legal guardians with them during their procedure.  Inpatient Visitation Update:   Two designated support people may visit a patient during visiting hours 7 am to 8 pm. It must be the same two designated people for the duration of the patient stay. The visitors may come and go during the day, and there is no switching out to have different visitors. A mask must be worn at all times, including in the patient room.  Children under 81 years of age:  a total of 4 designated visitors for the child's entire stay are allowed. Only 2 in the room at a time and only one staying overnight at a time. The overnight guest can now rotate during the child's hospital stay.  As a reminder, masks are still required for all [facility] team members, patients and visitors in all [facility] facilities.   Systemwide, no visitors 17 or younger.

## 2020-06-19 ENCOUNTER — Encounter
Admission: RE | Admit: 2020-06-19 | Discharge: 2020-06-19 | Disposition: A | Discharge status: Home / Self Care | Payer: Medicare Other | Source: Ambulatory Visit | Attending: Neurosurgery | Admitting: Neurosurgery

## 2020-06-19 ENCOUNTER — Other Ambulatory Visit: Payer: Self-pay

## 2020-06-19 DIAGNOSIS — Z01818 Encounter for other preprocedural examination: Secondary | ICD-10-CM | POA: Insufficient documentation

## 2020-06-19 DIAGNOSIS — I509 Heart failure, unspecified: Secondary | ICD-10-CM | POA: Diagnosis not present

## 2020-06-19 DIAGNOSIS — Z20822 Contact with and (suspected) exposure to covid-19: Secondary | ICD-10-CM | POA: Diagnosis not present

## 2020-06-19 DIAGNOSIS — R7989 Other specified abnormal findings of blood chemistry: Secondary | ICD-10-CM | POA: Insufficient documentation

## 2020-06-19 LAB — CBC
HCT: 37.5 % (ref 36.0–46.0)
Hemoglobin: 12.1 g/dL (ref 12.0–15.0)
MCH: 28.5 pg (ref 26.0–34.0)
MCHC: 32.3 g/dL (ref 30.0–36.0)
MCV: 88.4 fL (ref 80.0–100.0)
Platelets: 352 10*3/uL (ref 150–400)
RBC: 4.24 MIL/uL (ref 3.87–5.11)
RDW: 15.9 % — ABNORMAL HIGH (ref 11.5–15.5)
WBC: 10.9 10*3/uL — ABNORMAL HIGH (ref 4.0–10.5)
nRBC: 0 % (ref 0.0–0.2)

## 2020-06-19 LAB — BASIC METABOLIC PANEL
Anion gap: 12 (ref 5–15)
BUN: 14 mg/dL (ref 6–20)
CO2: 27 mmol/L (ref 22–32)
Calcium: 9.2 mg/dL (ref 8.9–10.3)
Chloride: 104 mmol/L (ref 98–111)
Creatinine, Ser: 0.83 mg/dL (ref 0.44–1.00)
GFR calc Af Amer: >60 mL/min (ref >60)
GFR calc non Af Amer: >60 mL/min (ref >60)
Glucose, Bld: 111 mg/dL — ABNORMAL HIGH (ref 70–99)
Potassium: 3.7 mmol/L (ref 3.5–5.1)
Sodium: 143 mmol/L (ref 135–145)

## 2020-06-19 LAB — SARS CORONAVIRUS 2 (TAT 6-24 HRS): SARS Coronavirus 2: NEGATIVE

## 2020-06-19 LAB — URINALYSIS, ROUTINE W REFLEX MICROSCOPIC
Bacteria, UA: NONE SEEN
Bilirubin Urine: NEGATIVE
Glucose, UA: NEGATIVE mg/dL
Hgb urine dipstick: NEGATIVE
Ketones, ur: NEGATIVE mg/dL
Nitrite: NEGATIVE
Protein, ur: NEGATIVE mg/dL
Specific Gravity, Urine: 1.031 — ABNORMAL HIGH (ref 1.005–1.030)
pH: 5 (ref 5.0–8.0)

## 2020-06-19 LAB — PROTIME-INR
INR: 0.9 (ref 0.8–1.2)
Prothrombin Time: 11.7 seconds (ref 11.4–15.2)

## 2020-06-19 LAB — TYPE AND SCREEN
ABO/RH(D): O POS
Antibody Screen: NEGATIVE

## 2020-06-19 LAB — SURGICAL PCR SCREEN
MRSA, PCR: NEGATIVE
Staphylococcus aureus: POSITIVE — AB

## 2020-06-19 LAB — APTT: aPTT: 29 seconds (ref 24–36)

## 2020-06-19 NOTE — Progress Notes (Signed)
  [facility] Perioperative Services: Pre-Admission/Anesthesia Testing  Abnormal Lab Notification  Date: 06/19/20  Name: Cassandra Brown MRN:   [id number]  Re: Abnormal labs noted during PAT appointment  Provider Notified: Deetta Perla, MD Notification mode: Faxed to office via Will of concern: Lab Results  Component Value Date   [facility] NEGATIVE 06/19/2020   STAPHAUREUS POSITIVE (A) 06/19/2020    Honor Loh, MSN, APRN, FNP-C, CEN [facility]  Peri-operative Services Nurse Practitioner Phone: [phone] 06/19/20 2:58 PM

## 2020-06-23 ENCOUNTER — Encounter: Payer: Self-pay | Admitting: Neurosurgery

## 2020-06-23 ENCOUNTER — Other Ambulatory Visit: Payer: Self-pay

## 2020-06-23 ENCOUNTER — Ambulatory Visit: Payer: Medicare Other

## 2020-06-23 ENCOUNTER — Ambulatory Visit: Payer: Medicare Other | Admitting: Anesthesiology

## 2020-06-23 ENCOUNTER — Ambulatory Visit
Admission: RE | Admit: 2020-06-23 | Discharge: 2020-06-23 | Disposition: A | Discharge status: Home / Self Care | Payer: Medicare Other | Attending: Neurosurgery | Admitting: Neurosurgery

## 2020-06-23 ENCOUNTER — Encounter
Admission: RE | Disposition: A | Discharge status: Home / Self Care | Payer: Self-pay | Source: Home / Self Care | Attending: Neurosurgery

## 2020-06-23 DIAGNOSIS — G709 Myoneural disorder, unspecified: Secondary | ICD-10-CM | POA: Insufficient documentation

## 2020-06-23 DIAGNOSIS — Z7982 Long term (current) use of aspirin: Secondary | ICD-10-CM | POA: Insufficient documentation

## 2020-06-23 DIAGNOSIS — M1990 Unspecified osteoarthritis, unspecified site: Secondary | ICD-10-CM | POA: Diagnosis not present

## 2020-06-23 DIAGNOSIS — M069 Rheumatoid arthritis, unspecified: Secondary | ICD-10-CM | POA: Diagnosis not present

## 2020-06-23 DIAGNOSIS — J45909 Unspecified asthma, uncomplicated: Secondary | ICD-10-CM | POA: Insufficient documentation

## 2020-06-23 DIAGNOSIS — I4891 Unspecified atrial fibrillation: Secondary | ICD-10-CM | POA: Diagnosis not present

## 2020-06-23 DIAGNOSIS — G4730 Sleep apnea, unspecified: Secondary | ICD-10-CM | POA: Diagnosis not present

## 2020-06-23 DIAGNOSIS — K219 Gastro-esophageal reflux disease without esophagitis: Secondary | ICD-10-CM | POA: Diagnosis not present

## 2020-06-23 DIAGNOSIS — I509 Heart failure, unspecified: Secondary | ICD-10-CM | POA: Insufficient documentation

## 2020-06-23 DIAGNOSIS — I2510 Atherosclerotic heart disease of native coronary artery without angina pectoris: Secondary | ICD-10-CM | POA: Diagnosis not present

## 2020-06-23 DIAGNOSIS — E669 Obesity, unspecified: Secondary | ICD-10-CM | POA: Insufficient documentation

## 2020-06-23 DIAGNOSIS — Z419 Encounter for procedure for purposes other than remedying health state, unspecified: Secondary | ICD-10-CM

## 2020-06-23 DIAGNOSIS — Z6837 Body mass index (BMI) 37.0-37.9, adult: Secondary | ICD-10-CM | POA: Insufficient documentation

## 2020-06-23 DIAGNOSIS — G894 Chronic pain syndrome: Secondary | ICD-10-CM | POA: Diagnosis present

## 2020-06-23 DIAGNOSIS — Z79899 Other long term (current) drug therapy: Secondary | ICD-10-CM | POA: Insufficient documentation

## 2020-06-23 DIAGNOSIS — Z87891 Personal history of nicotine dependence: Secondary | ICD-10-CM | POA: Diagnosis not present

## 2020-06-23 DIAGNOSIS — Q211 Atrial septal defect: Secondary | ICD-10-CM | POA: Insufficient documentation

## 2020-06-23 DIAGNOSIS — K3184 Gastroparesis: Secondary | ICD-10-CM | POA: Insufficient documentation

## 2020-06-23 DIAGNOSIS — Z7952 Long term (current) use of systemic steroids: Secondary | ICD-10-CM | POA: Diagnosis not present

## 2020-06-23 HISTORY — PX: PULSE GENERATOR IMPLANT: SHX5370

## 2020-06-23 HISTORY — PX: THORACIC LAMINECTOMY FOR SPINAL CORD STIMULATOR: SHX6887

## 2020-06-23 LAB — ABO/RH: ABO/RH(D): O POS

## 2020-06-23 SURGERY — THORACIC LAMINECTOMY FOR SPINAL CORD STIMULATOR
Anesthesia: General | Laterality: Left

## 2020-06-23 MED ORDER — DEXAMETHASONE SODIUM PHOSPHATE 10 MG/ML IJ SOLN
INTRAMUSCULAR | Status: AC
  Filled 2020-06-23: qty 1

## 2020-06-23 MED ORDER — CHLORHEXIDINE GLUCONATE 0.12 % MT SOLN
OROMUCOSAL | Status: AC
  Administered 2020-06-23: 15 mL via OROMUCOSAL
  Filled 2020-06-23: qty 15

## 2020-06-23 MED ORDER — LIDOCAINE HCL (PF) 2 % IJ SOLN
INTRAMUSCULAR | Status: AC
  Filled 2020-06-23: qty 5

## 2020-06-23 MED ORDER — VANCOMYCIN HCL 1 G IV SOLR
INTRAVENOUS | Status: DC | PRN
  Administered 2020-06-23: 1000 mg via TOPICAL

## 2020-06-23 MED ORDER — PROPOFOL 500 MG/50ML IV EMUL
INTRAVENOUS | Status: AC
  Filled 2020-06-23: qty 50

## 2020-06-23 MED ORDER — PROPOFOL 10 MG/ML IV BOLUS
INTRAVENOUS | Status: AC
  Filled 2020-06-23: qty 20

## 2020-06-23 MED ORDER — ORAL CARE MOUTH RINSE: 15.0000 mL | Freq: Once | OROMUCOSAL | Status: AC

## 2020-06-23 MED ORDER — DIPHENHYDRAMINE HCL 50 MG/ML IJ SOLN
INTRAMUSCULAR | Status: DC | PRN
  Administered 2020-06-23: 25 mg via INTRAVENOUS

## 2020-06-23 MED ORDER — LACTATED RINGERS IV SOLN: INTRAVENOUS | Status: DC

## 2020-06-23 MED ORDER — PHENYLEPHRINE HCL (PRESSORS) 10 MG/ML IV SOLN
INTRAVENOUS | Status: AC
  Filled 2020-06-23: qty 1

## 2020-06-23 MED ORDER — NALOXONE HCL 4 MG/0.1ML NA LIQD: NASAL | 0 refills | Status: AC

## 2020-06-23 MED ORDER — SUCCINYLCHOLINE CHLORIDE 20 MG/ML IJ SOLN
INTRAMUSCULAR | Status: DC | PRN
  Administered 2020-06-23: 100 mg via INTRAVENOUS

## 2020-06-23 MED ORDER — PHENYLEPHRINE HCL (PRESSORS) 10 MG/ML IV SOLN
INTRAVENOUS | Status: DC | PRN
  Administered 2020-06-23: 50 ug via INTRAVENOUS
  Administered 2020-06-23 (×2): 100 ug via INTRAVENOUS
  Administered 2020-06-23: 50 ug via INTRAVENOUS
  Administered 2020-06-23: 100 ug via INTRAVENOUS

## 2020-06-23 MED ORDER — MIDAZOLAM HCL 2 MG/2ML IJ SOLN
INTRAMUSCULAR | Status: AC
  Filled 2020-06-23: qty 2

## 2020-06-23 MED ORDER — EPHEDRINE SULFATE 50 MG/ML IJ SOLN
INTRAMUSCULAR | Status: DC | PRN
  Administered 2020-06-23 (×2): 10 mg via INTRAVENOUS

## 2020-06-23 MED ORDER — OXYCODONE HCL 5 MG PO TABS: 5.0000 mg | ORAL_TABLET | ORAL | 0 refills | Status: AC | PRN

## 2020-06-23 MED ORDER — CEFAZOLIN SODIUM-DEXTROSE 2-4 GM/100ML-% IV SOLN
INTRAVENOUS | Status: AC
  Filled 2020-06-23: qty 100

## 2020-06-23 MED ORDER — BUPIVACAINE-EPINEPHRINE (PF) 0.5% -1:200000 IJ SOLN
INTRAMUSCULAR | Status: DC | PRN
  Administered 2020-06-23: 10 mL
  Administered 2020-06-23: 20 mL

## 2020-06-23 MED ORDER — FENTANYL CITRATE (PF) 100 MCG/2ML IJ SOLN
INTRAMUSCULAR | Status: AC
  Filled 2020-06-23: qty 2

## 2020-06-23 MED ORDER — FENTANYL CITRATE (PF) 100 MCG/2ML IJ SOLN
INTRAMUSCULAR | Status: DC | PRN
  Administered 2020-06-23 (×2): 100 ug via INTRAVENOUS

## 2020-06-23 MED ORDER — THROMBIN 5000 UNITS EX SOLR
CUTANEOUS | Status: DC | PRN
  Administered 2020-06-23: 5000 [IU] via TOPICAL

## 2020-06-23 MED ORDER — OXYCODONE HCL 5 MG PO TABS
ORAL_TABLET | ORAL | Status: AC
  Filled 2020-06-23: qty 1

## 2020-06-23 MED ORDER — ONDANSETRON HCL 4 MG/2ML IJ SOLN
INTRAMUSCULAR | Status: AC
  Filled 2020-06-23: qty 2

## 2020-06-23 MED ORDER — OXYCODONE HCL 5 MG PO TABS
5.0000 mg | ORAL_TABLET | ORAL | Status: DC | PRN
  Administered 2020-06-23: 5 mg via ORAL

## 2020-06-23 MED ORDER — MIDAZOLAM HCL 2 MG/2ML IJ SOLN
INTRAMUSCULAR | Status: DC | PRN
  Administered 2020-06-23: 2 mg via INTRAVENOUS

## 2020-06-23 MED ORDER — ONDANSETRON HCL 4 MG/2ML IJ SOLN
INTRAMUSCULAR | Status: DC | PRN
  Administered 2020-06-23: 4 mg via INTRAVENOUS

## 2020-06-23 MED ORDER — FENTANYL CITRATE (PF) 100 MCG/2ML IJ SOLN
25.0000 ug | INTRAMUSCULAR | Status: DC | PRN
  Administered 2020-06-23: 25 ug via INTRAVENOUS

## 2020-06-23 MED ORDER — DEXAMETHASONE SODIUM PHOSPHATE 10 MG/ML IJ SOLN
INTRAMUSCULAR | Status: DC | PRN
  Administered 2020-06-23: 10 mg via INTRAVENOUS

## 2020-06-23 MED ORDER — LACTATED RINGERS IV SOLN: 1000.0000 mL | Freq: Once | INTRAVENOUS | Status: DC

## 2020-06-23 MED ORDER — ONDANSETRON HCL 4 MG/2ML IJ SOLN: 4.0000 mg | Freq: Once | INTRAMUSCULAR | Status: DC | PRN

## 2020-06-23 MED ORDER — PROPOFOL 500 MG/50ML IV EMUL
INTRAVENOUS | Status: DC | PRN
  Administered 2020-06-23: 120 ug/kg/min via INTRAVENOUS

## 2020-06-23 MED ORDER — PROPOFOL 10 MG/ML IV BOLUS
INTRAVENOUS | Status: DC | PRN
  Administered 2020-06-23: 150 mg via INTRAVENOUS
  Administered 2020-06-23: 50 mg via INTRAVENOUS

## 2020-06-23 MED ORDER — LIDOCAINE HCL (CARDIAC) PF 100 MG/5ML IV SOSY
PREFILLED_SYRINGE | INTRAVENOUS | Status: DC | PRN
  Administered 2020-06-23: 50 mg via INTRAVENOUS

## 2020-06-23 MED ORDER — CHLORHEXIDINE GLUCONATE 0.12 % MT SOLN: 15.0000 mL | Freq: Once | OROMUCOSAL | Status: AC

## 2020-06-23 MED ORDER — CEFAZOLIN SODIUM-DEXTROSE 2-4 GM/100ML-% IV SOLN
2.0000 g | Freq: Once | INTRAVENOUS | Status: AC
  Administered 2020-06-23: 2 g via INTRAVENOUS

## 2020-06-23 MED ORDER — DIPHENHYDRAMINE HCL 50 MG/ML IJ SOLN
INTRAMUSCULAR | Status: AC
  Filled 2020-06-23: qty 1

## 2020-06-23 SURGICAL SUPPLY — 78 items
BINDER ABDOMINAL 12 ML 46-62 (SOFTGOODS) ×3 IMPLANT
BLADE BOVIE TIP EXT 4 (BLADE) ×3 IMPLANT
BUR NEURO DRILL SOFT 3.0X3.8M (BURR) ×3 IMPLANT
CANISTER SUCT 1200ML W/VALVE (MISCELLANEOUS) ×6 IMPLANT
CHLORAPREP W/TINT 26 (MISCELLANEOUS) ×6 IMPLANT
CNTNR SPEC 2.5X3XGRAD LEK (MISCELLANEOUS) ×2
CONT SPEC 4OZ STER OR WHT (MISCELLANEOUS) ×1
CONTAINER SPEC 2.5X3XGRAD LEK (MISCELLANEOUS) ×2 IMPLANT
COUNTER NEEDLE 20/40 LG (NEEDLE) ×3 IMPLANT
COVER LIGHT HANDLE STERIS (MISCELLANEOUS) ×6 IMPLANT
COVER WAND RF STERILE (DRAPES) ×3 IMPLANT
CUP MEDICINE 2OZ PLAST GRAD ST (MISCELLANEOUS) ×3 IMPLANT
DERMABOND ADVANCED (GAUZE/BANDAGES/DRESSINGS) ×1
DERMABOND ADVANCED .7 DNX12 (GAUZE/BANDAGES/DRESSINGS) ×2 IMPLANT
DEVICE IMPLANT NEUROSTIMULATOR (Neuro Prosthesis/Implant) ×3 IMPLANT
DRAPE C-ARM XRAY 36X54 (DRAPES) ×6 IMPLANT
DRAPE C-ARMOR (DRAPES) ×3 IMPLANT
DRAPE INCISE IOBAN 66X45 STRL (DRAPES) ×3 IMPLANT
DRAPE LAPAROTOMY 100X77 ABD (DRAPES) ×3 IMPLANT
DRAPE LAPAROTOMY 77X122 PED (DRAPES) ×3 IMPLANT
DRAPE MICROSCOPE SPINE 48X150 (DRAPES) IMPLANT
DRAPE SURG 17X11 SM STRL (DRAPES) ×3 IMPLANT
DRSG TEGADERM 4X4.75 (GAUZE/BANDAGES/DRESSINGS) ×6 IMPLANT
DRSG TELFA 4X3 1S NADH ST (GAUZE/BANDAGES/DRESSINGS) IMPLANT
DURASEAL APPLICATOR TIP (TIP) IMPLANT
DURASEAL SPINE SEALANT 3ML (MISCELLANEOUS) IMPLANT
ELECT CAUTERY BLADE TIP 2.5 (TIP) ×3
ELECT EZSTD 165MM 6.5IN (MISCELLANEOUS) ×3
ELECT REM PT RETURN 9FT ADLT (ELECTROSURGICAL) ×3
ELECTRODE CAUTERY BLDE TIP 2.5 (TIP) ×2 IMPLANT
ELECTRODE EZSTD 165MM 6.5IN (MISCELLANEOUS) ×2 IMPLANT
ELECTRODE REM PT RTRN 9FT ADLT (ELECTROSURGICAL) ×2 IMPLANT
ENVELOPE ABSORB ANTIBACTERIAL (Neuro Prosthesis/Implant) ×3 IMPLANT
FEE INTRAOP MONITOR IMPULS NCS (MISCELLANEOUS) ×2 IMPLANT
GAUZE SPONGE 4X4 12PLY STRL (GAUZE/BANDAGES/DRESSINGS) ×3 IMPLANT
GLOVE BIOGEL PI IND STRL 7.0 (GLOVE) ×2 IMPLANT
GLOVE BIOGEL PI IND STRL 8 (GLOVE) ×2 IMPLANT
GLOVE BIOGEL PI INDICATOR 7.0 (GLOVE) ×1
GLOVE BIOGEL PI INDICATOR 8 (GLOVE) ×1
GLOVE INDICATOR 8.0 STRL GRN (GLOVE) ×3 IMPLANT
GLOVE SURG SYN 7.0 (GLOVE) ×6 IMPLANT
GLOVE SURG SYN 8.0 (GLOVE) ×6 IMPLANT
GOWN STRL REUS W/ TWL LRG LVL3 (GOWN DISPOSABLE) ×2 IMPLANT
GOWN STRL REUS W/ TWL XL LVL3 (GOWN DISPOSABLE) ×4 IMPLANT
GOWN STRL REUS W/TWL LRG LVL3 (GOWN DISPOSABLE) ×1
GOWN STRL REUS W/TWL XL LVL3 (GOWN DISPOSABLE) ×2
GRADUATE 1200CC STRL 31836 (MISCELLANEOUS) ×3 IMPLANT
INTRAOP MONITOR FEE IMPULS NCS (MISCELLANEOUS) ×2
INTRAOP MONITOR FEE IMPULSE (MISCELLANEOUS) ×1
KIT LEAD (Lead) ×6 IMPLANT
KIT TURNOVER KIT A (KITS) ×3 IMPLANT
KIT WILSON FRAME (KITS) IMPLANT
MARKER SKIN DUAL TIP RULER LAB (MISCELLANEOUS) ×9 IMPLANT
NDL SAFETY ECLIPSE 18X1.5 (NEEDLE) ×2 IMPLANT
NEEDLE HYPO 18GX1.5 SHARP (NEEDLE) ×1
NEEDLE HYPO 22GX1.5 SAFETY (NEEDLE) ×3 IMPLANT
NS IRRIG 1000ML POUR BTL (IV SOLUTION) ×3 IMPLANT
PACK LAMINECTOMY NEURO (CUSTOM PROCEDURE TRAY) ×3 IMPLANT
PAD ARMBOARD 7.5X6 YLW CONV (MISCELLANEOUS) ×3 IMPLANT
RECHARGER INTELLIS (NEUROSURGERY SUPPLIES) ×3 IMPLANT
REPROGRAMMER INTELLIS (NEUROSURGERY SUPPLIES) ×3 IMPLANT
SPOGE SURGIFLO 8M (HEMOSTASIS) ×1
SPONGE SURGIFLO 8M (HEMOSTASIS) ×2 IMPLANT
STAPLER SKIN PROX 35W (STAPLE) IMPLANT
SUT ETHILON 3-0 FS-10 30 BLK (SUTURE) ×9
SUT POLYSORB 2-0 5X18 GS-10 (SUTURE) ×24 IMPLANT
SUT SILK 2 0 PERMA HAND 18 BK (SUTURE) IMPLANT
SUT SILK 2 0 SH (SUTURE) ×6 IMPLANT
SUT VIC AB 0 CT1 18XCR BRD 8 (SUTURE) ×4 IMPLANT
SUT VIC AB 0 CT1 8-18 (SUTURE) ×2
SUTURE EHLN 3-0 FS-10 30 BLK (SUTURE) ×6 IMPLANT
SYR 10ML LL (SYRINGE) ×6 IMPLANT
SYR 20ML LL LF (SYRINGE) ×3 IMPLANT
SYR 30ML LL (SYRINGE) ×6 IMPLANT
SYR 3ML LL SCALE MARK (SYRINGE) ×3 IMPLANT
SYR BULB IRRIG 60ML STRL (SYRINGE) ×3 IMPLANT
TOWEL OR 17X26 4PK STRL BLUE (TOWEL DISPOSABLE) ×6 IMPLANT
TUBING CONNECTING 10 (TUBING) ×3 IMPLANT

## 2020-06-23 NOTE — Op Note (Signed)
Operative Note  SURGERY DATE:06/23/2020  PRE-OP DIAGNOSIS: Chronic pain syndrome  POST-OP DIAGNOSIS:Post-Op Diagnosis Codes: Chronic pain syndrome  Procedure(s) with comments: Bilateral percutaneous spinal cord stimulator lead placement Left flank pulse generator  SURGEON:  * Malen Gauze, MD Lonell Face - assistant   ANESTHESIA:IV analgesic  OPERATIVE FINDINGS:Successful placement of thoracic spinal cord stimulatorleads and left flank pulse generator  Indication Ms. Murraywas seen in clinic on6/15 with ongoing back and left > right leg pain. MRI of the spine revealed no concerning stenosis at the level of the implant. The patient underwent a spinal cord stimulator trial previously  and had great relief of her pain. She therefore wanted to proceed with the permanent implant.Risks including weakness, hematoma, infection, failure of pain relief, post-operative pain, stroke, heart attack, pneumonia, and spinal cord injury were discussed.We discussed using neuro monitoring to prevent any neurologic deficits.   Procedure The patient was brought to the operating room where vascular access was obtained andshe was intubated by the anesthesia service. Neuro monitoring electrodes were placed and baseline MEP's and SSEPs were normal. She wasplaced prone on gel rolls. Antibiotics were given.Fluoroscopy was used to confirm planned incision inlumbar area at the level of L3in the midline. In addition incision was planned in the left flank for the pulse generator placement. The patient was prepped and draped in a sterile fashion. A hard time out was performed. Local anesthetic was instilled intoplannedincisionsites.   The midline lumbar incision was opened and taken to the fascia using cautery.Next, a Touhy needle was used to insert in a paramedian approach to enter the interlaminar space between L1 and L2. Once loss of resistance was identified,  this was confirmed with a metal stylette. Next, the percutaneous lead was passed in the rostral direction using fluoroscopy as guidance to keep in the midline. This was passed without resistance to the level of the T7 vertebral body. Lateral views were obtained to confirm we were in the dorsal epidural space. Next, the same procedure was performed contralaterally placing an additional lead in the midline at the T8 vertebral body, slightly offset to the previous lead to allow for better coverage.We used the programmerand monitoringto ensure we were getting bilateral coverage on monitoring.The leads were secured with anchors in the fascia. The incision was irrigated and hemostasis obtained  Next, the left flank incision was opened and taken to the fascia and inferior dissection to allow for a large enough pocket for the placement of the battery. The incision was irrigated and hemostasis obtained. Next, a tunneler was used to pass the electrode leads from the lumbar incision to the left flank. There, the electrodes were attached to the pulse generator and impedances were found to be normal. The pulse generator was placed into the antibiotic pouch and then placed into the incision taking care to place the wires beneath the pulse generator.  A final fluoroscopic image was taken show good placement ofpercutaneous leads.MEPS and SSEPS remained stable throughout the case. Vancomycin powder was placed into the incisions. Next, a combination of 0, 2-0  Vicryls were used to close the incisions with 3-0 nylon on the skin.  Sterile dressings were applied. The patient was returned to supine positionand the patient was seen to be moving all extremities symmetrically and was taken to PACU for recovery. The family was updated and all questions answered.   ESTIMATED BLOOD LOSS: 10cc  SPECIMENS None  IMPLANT DEVICE Lucillie Garfinkel [id number]  Inventory Item: DEVICE IMPLANT  NEUROSTIMINATOR Serial no.: [id number] Model/Cat  no.: [id number]  Implant name: DEVICE Lucillie Garfinkel - [id number] Laterality:  Area: Back   Manufacturer: [employer] NEUROMOD PAIN MGMT Date of Manufacture:    Action: Implanted Number Used: 1   Device Identifier:  Device Identifier Type:    KIT LEAD - [id number]  Inventory Item: KIT LEAD Serial no.:  Model/Cat no.: [id number]  Implant name: KIT LEAD - [id number] Laterality: N/A Area: Back   Manufacturer: [employer] [place] [employer] Date of Manufacture:    Action: Implanted Number Used: 1   Device Identifier:  Device Identifier Type:    KIT LEAD - [id number]  Inventory Item: KIT LEAD Serial no.:  Model/Cat no.: [id number]  Implant name: KIT LEAD - [id number] Laterality: N/A Area: Back   Manufacturer: [employer] Date of Manufacture:    Action: Implanted Number Used: 1   Device Identifier:  Device Identifier Type:    ENVELOPE ABSORB ANTIBACTERIAL - [id number]  Inventory Item: ENVELOPE ABSORB ANTIBACTERIAL Serial no.:  Model/Cat no.: [id number]  Implant name: ENVELOPE ABSORB ANTIBACTERIAL - [id number] Laterality: N/A Area: Back   Manufacturer: [employer] Date of Manufacture:    Action: Implanted Number Used: 1   Device Identifier:  Device Identifier Type:        I performed the case in its entiretywithassistance of Lonell Face, NP  Deetta Perla, [place]

## 2020-06-23 NOTE — Discharge Instructions (Addendum)
NEUROSURGERY DISCHARGE INSTRUCTIONS  The following are instructions to help in your recovery once you have been discharged from the hospital. Even if you feel well, it is important that you follow these activity guidelines.  What to do after you leave the hospital:  Recommended diet:  Increase protein intake to promote wound healing. You may return to your usual diet. However, you may experience discomfort when swallowing in the first month after your surgery. This is normal. You may find that softer foods are more comfortable for you to swallow. Be sure to stay hydrated.   Recommended activity: No bending, lifting, or twisting ("BLT"). Avoid lifting objects heavier than 10 pounds (gallon milk jug). Where possible, avoid household activities that involve lifting, bending, reaching, pushing, or pulling such as laundry, vacuuming, grocery shopping, and childcare. Try to arrange for help from friends and family for these activities while you heal.   Increase physical activity slowly as tolerated. Taking short walks is encouraged, but avoid strenuous exercise. Do not jog, run, bicycle, lift weights, or participate in any other exercises unless specifically allowed by your doctor.   You should not drive until cleared by your doctor.   Until released by your doctor, you should not return to work or school. You should rest at home and let your body heal.   You may shower the day after your surgery. After showering, lightly dab your incision dry. Do not take a tub bath or go swimming until approved by your doctor at your follow-up appointment.   If you smoke, we strongly recommend that you quit. Smoking has been proven to interfere with normal bone healing and will dramatically reduce the success rate of your surgery. Please contact [facility] (800-QUIT-NOW) and use the resources at [email or web address] for assistance in stopping smoking.   Medications  Do not restart Aspirin until seven days after  surgery  * Do not take anti-inflammatory medications for 7 days after surgery (naproxen [Aleve], ibuprofen [Advil, Motrin], celecoxib [Celebrex], etc.).   You may restart home medications.   Wound Care Instructions  If you have a dressing on your incision, remove it two days after your surgery. Keep your incision area clean and dry.   If you have staples or stitches on your incision, you should have a follow up scheduled for removal. If you do not have staples or stitches, you will have steri-strips (small pieces of surgical tape) or Dermabond glue. The steri-strips/glue should begin to peel away within about a week (it is fine if the steri-strips fall off before then). If the strips are still in place one week after your surgery, you may gently remove them.    Please Report any of the following: Should you experience any of the following, contact us immediately:   New numbness or weakness   Pain that is progressively getting worse, and is not relieved by your pain medication, muscle relaxers, rest, and warm compresses   Bleeding, redness, swelling, pain, or drainage from surgical incision   Chills or flu-like symptoms   Fever greater than 101.0 F (38.3 C)   Inability to eat, drink fluids, or take medications   Problems with bowel or bladder functions   Difficulty breathing or shortness of breath   Warmth, tenderness, or swelling in your calf    Additional Follow up appointments During office hours (Monday-Friday 9 am to 5 pm), please call your physician at [phone] and ask for Berdine Addison.   After hours and weekends, please call [phone] and an answering  service will put you in touch with either Dr. Lacinda Axon or Dr. Izora Ribas.   For a life-threatening emergency, call [place]   1) The drugs that you were given will stay in your system until tomorrow so for the next 24 hours you should not:  A) Drive an  automobile B) Make any legal decisions C) Drink any alcoholic beverage   2) You may resume regular meals tomorrow.  Today it is better to start with liquids and gradually work up to solid foods.  You may eat anything you prefer, but it is better to start with liquids, then soup and crackers, and gradually work up to solid foods.   3) Please notify your doctor immediately if you have any unusual bleeding, trouble breathing, redness and pain at the surgery site, drainage, fever, or pain not relieved by medication.    4) Additional Instructions:        Please contact your physician with any problems or Same Day Surgery at [phone], Monday through Friday 6 am to 4 pm, or [place] at [facility] number at [phone].

## 2020-06-23 NOTE — Discharge Summary (Signed)
Procedure: Permanent thoracic SCS placement Procedure date: 06/23/2020 Diagnosis: chronic pain   History: Cassandra Brown is s/p thoracic SCS placement POD0: Tolerated procedure well. Evaluated in post op recovery still disoriented from anesthesia but able to answer questions and obey commands.   Physical Exam: Vitals:   06/23/20 1200 06/23/20 1219  BP: 112/72 130/70  Pulse: (!) 55 63  Resp: 16 18  Temp:  (!) 96.9 F (36.1 C)  SpO2: 99% 96%    General: Alert and oriented, lying in bed Strength:5/5 throughout  Sensation: intact and symmetric throughout Skin: Incision clean, dry, intact Data:  Recent Labs  Lab 06/19/20 1219  NA 143  K 3.7  CL 104  CO2 27  BUN 14  CREATININE 0.83  GLUCOSE 111*  CALCIUM 9.2   No results for input(s): AST, ALT, ALKPHOS in the last 168 hours.  Invalid input(s): TBILI   Recent Labs  Lab 06/19/20 1219  WBC 10.9*  HGB 12.1  HCT 37.5  PLT 352   Recent Labs  Lab 06/19/20 1219  APTT 29  INR 0.9         Assessment/Plan:  Cassandra Brown is POD0 s/p thoracic SCS placement.  Once she is able to ambulate, tolerate PO, and urinate, she is cleared for discharge to home.    Lonell Face, NP Department of Neurosurgery

## 2020-06-23 NOTE — Transfer of Care (Signed)
Immediate Anesthesia Transfer of Care Note  Patient: Cassandra Brown  Procedure(s) Performed: THORACIC SPINAL CORD STIMULATOR (Bilateral ) LEFT FLANK PULSE GENERATOR IMPLANT (Left )  Patient Location: PACU  Anesthesia Type:General  Level of Consciousness: sedated and responds to stimulation  Airway & Oxygen Therapy: Patient Spontanous Breathing and Patient connected to face mask oxygen  Post-op Assessment: Report given to RN and Post -op Vital signs reviewed and stable  Post vital signs: Reviewed and stable  Last Vitals:  Vitals Value Taken Time  BP 160/96 06/23/20 1056  Temp    Pulse 80 06/23/20 1055  Resp 12 06/23/20 1056  SpO2 100 % 06/23/20 1056  Vitals shown include unvalidated device data.  Last Pain:  Vitals:   06/23/20 0753  TempSrc: Tympanic  PainSc: 7          Complications: No complications documented.

## 2020-06-23 NOTE — Anesthesia Procedure Notes (Signed)
Procedure Name: Intubation Date/Time: 06/23/2020 9:04 AM Performed by: Jonna Clark, CRNA Pre-anesthesia Checklist: Patient identified, Patient being monitored, Timeout performed, Emergency Drugs available and Suction available Patient Re-evaluated:Patient Re-evaluated prior to induction Oxygen Delivery Method: Circle system utilized Preoxygenation: Pre-oxygenation with 100% oxygen Induction Type: IV induction and Cricoid Pressure applied Ventilation: Mask ventilation without difficulty Laryngoscope Size: Mac and 3 Grade View: Grade I Tube type: Oral Tube size: 7.0 mm Number of attempts: 1 Airway Equipment and Method: Stylet Placement Confirmation: ETT inserted through vocal cords under direct vision,  positive ETCO2 and breath sounds checked- equal and bilateral Secured at: 21 cm Tube secured with: Tape Dental Injury: Teeth and Oropharynx as per pre-operative assessment

## 2020-06-23 NOTE — Interval H&P Note (Signed)
History and Physical Interval Note:  06/23/2020 8:22 AM  Cassandra Brown  has presented today for surgery, with the diagnosis of chronic pain.  The various methods of treatment have been discussed with the patient and family. After consideration of risks, benefits and other options for treatment, the patient has consented to  Procedure(s): THORACIC SPINAL CORD STIMULATOR (Bilateral) LEFT FLANK PULSE GENERATOR IMPLANT (Left) as a surgical intervention.  The patient's history has been reviewed, patient examined, no change in status, stable for surgery.  I have reviewed the patient's chart and labs.  Questions were answered to the patient's satisfaction.     Deetta Perla

## 2020-06-23 NOTE — Anesthesia Preprocedure Evaluation (Addendum)
Anesthesia Evaluation  Patient identified by MRN, date of birth, ID band Patient awake    Reviewed: Allergy & Precautions, NPO status , Patient's Chart, lab work & pertinent test results  Airway Mallampati: III       Dental  (+) Upper Dentures, Lower Dentures   Pulmonary asthma , sleep apnea , former smoker,           Cardiovascular + angina + CAD and +CHF  + dysrhythmias      Neuro/Psych  Neuromuscular disease negative psych ROS   GI/Hepatic Neg liver ROS, GERD  ,  Endo/Other  negative endocrine ROS  Renal/GU negative Renal ROS     Musculoskeletal  (+) Arthritis , Osteoarthritis,    Abdominal   Peds negative pediatric ROS (+)  Hematology negative hematology ROS (+)   Anesthesia Other Findings Past Medical History: No date: Allergic rhinitis due to pollen No date: Angina pectoris (HCC) No date: CHF (congestive heart failure) (HCC) No date: Chronic maxillary sinusitis No date: Chronic pain syndrome     Comment:  BACK No date: Chronic right sacroiliac joint pain No date: Coronary artery abnormality No date: Dysrhythmia     Comment:  AFIB,  FREQ PVC No date: Gastroparesis No date: GERD (gastroesophageal reflux disease) No date: History of kidney stones No date: Leukocytosis No date: Obesity No date: Patent foramen ovale No date: Rheumatoid arthritis (HCC)     Comment:  OSTEOARTHRITIS No date: Sleep apnea No date: Spinal stenosis of lumbar region No date: Unequal leg length No date: Vasculitis ([place]) No date: Wegener's disease, pulmonary (HCC)  Reproductive/Obstetrics                            Anesthesia Physical Anesthesia Plan  ASA: III  Anesthesia Plan: General   Post-op Pain Management:    Induction: Intravenous  PONV Risk Score and Plan:   Airway Management Planned: Oral ETT  Additional Equipment:   Intra-op Plan:   Post-operative Plan: Extubation in  OR  Informed Consent: I have reviewed the patients History and Physical, chart, labs and discussed the procedure including the risks, benefits and alternatives for the proposed anesthesia with the patient or authorized representative who has indicated his/her understanding and acceptance.     Dental advisory given  Plan Discussed with: CRNA and Surgeon  Anesthesia Plan Comments:         Anesthesia Quick Evaluation

## 2020-06-23 NOTE — H&P (Signed)
Cassandra Brown is an 54 y.o. female.   Chief Complaint: Chronic back and leg pain HPI: Cassandra Brown is here for evaluation of chronic back and leg pain and underwent a recent spinal cord stimulator trial. She states she got greater than 50% relief in her back and left leg and she noted immediate return of the pain severity after the leads were removed. She had no difficulty with the trial and tolerated it well. She has no new symptoms and does continue to have the back pain with the left leg pain radiating down towards the foot with some intermittent numbness. She does not endorse any significant right leg pain.   Past Medical History:  Diagnosis Date  . Allergic rhinitis due to pollen   . Angina pectoris ([place])   . CHF (congestive heart failure) ([place])   . Chronic maxillary sinusitis   . Chronic pain syndrome    BACK  . Chronic right sacroiliac joint pain   . Coronary artery abnormality   . Dysrhythmia    AFIB,  FREQ PVC  . Gastroparesis   . GERD (gastroesophageal reflux disease)   . History of kidney stones   . Leukocytosis   . Obesity   . Patent foramen ovale   . Rheumatoid arthritis ([place])    OSTEOARTHRITIS  . Sleep apnea   . Spinal stenosis of lumbar region   . Unequal leg length   . Vasculitis ([place])   . Wegener's disease, pulmonary ([place])     Past Surgical History:  Procedure Laterality Date  . APPENDECTOMY    . CHOLECYSTECTOMY    . JOINT REPLACEMENT Right    knee  . KNEE SURGERY Left   . SHOULDER SURGERY Bilateral     History reviewed. No pertinent family history. Social History:  reports that she has quit smoking. She has never used smokeless tobacco. She reports previous alcohol use. She reports that she does not use drugs.  Allergies:  Allergies  Allergen Reactions  . Cyclobenzaprine Hives  . Ibuprofen Hives  . Morphine Itching and Swelling    Other reaction(s): itching/swelling   . Meperidine Itching and Swelling  . Sulfa Antibiotics Swelling  . Onion  Itching    Can use onion powder    Medications Prior to Admission  Medication Sig Dispense Refill  . aspirin EC 81 MG tablet Take 81 mg by mouth daily.    . bumetanide (BUMEX) 2 MG tablet Take 2 mg by mouth daily.     . carisoprodol (SOMA) 350 MG tablet Take 350 mg by mouth 3 (three) times daily.    . clobetasol cream (TEMOVATE) 3.53 % Apply 1 application topically 2 (two) times daily as needed (rash).    . folic acid (FOLVITE) 1 MG tablet Take 1 mg by mouth daily.    Marland Kitchen gentamicin ointment (GARAMYCIN) 0.1 % Apply 1 application topically daily as needed (rash).     . Golimumab (SIMPONI) 50 MG/0.5ML SOAJ Inject 50 mg into the skin every 30 (thirty) days.    Marland Kitchen HYDROcodone-acetaminophen (NORCO) 10-325 MG tablet Take 1 tablet by mouth every 6 (six) hours as needed for moderate pain.     . methotrexate 50 MG/2ML injection Inject 25 mg into the vein once a week.     . metolazone (ZAROXOLYN) 5 MG tablet Take 5 mg by mouth daily as needed (fluid).     . montelukast (SINGULAIR) 10 MG tablet Take 10 mg by mouth at bedtime.    . pantoprazole (PROTONIX) 40 MG  tablet Take 40 mg by mouth daily.    . pentoxifylline (TRENTAL) 400 MG CR tablet Take 400 mg by mouth 3 (three) times daily with meals.    . pimecrolimus (ELIDEL) 1 % cream Apply 1 application topically 2 (two) times daily as needed (rash).    . predniSONE (DELTASONE) 10 MG tablet Take 10 mg by mouth daily with breakfast.     . pregabalin (LYRICA) 50 MG capsule Take 50 mg by mouth 2 (two) times daily.     . promethazine (PHENERGAN) 25 MG tablet Take 25 mg by mouth every 6 (six) hours as needed for nausea or vomiting.     . sotalol (BETAPACE) 80 MG tablet Take 80 mg by mouth 2 (two) times daily.    Marland Kitchen Specialty Vitamins Products (CENTRUM PERFORMANCE) TABS Take 1 tablet by mouth daily.    Marland Kitchen spironolactone (ALDACTONE) 25 MG tablet Take 25 mg by mouth daily.    Marland Kitchen triamcinolone cream (KENALOG) 0.1 % Apply 1 application topically daily as needed  (itching).       No results found for this or any previous visit (from the past 48 hour(s)). No results found.  Review of Systems General ROS: Negative Respiratory ROS: Negative Cardiovascular ROS: Negative Gastrointestinal ROS: Negative Genito-Urinary ROS: Negative Musculoskeletal ROS: Positive for back pain Neurological ROS: Positive for left leg pain Dermatological ROS: Negative  Blood pressure (!) 154/59, pulse 62, temperature 97.9 F (36.6 C), temperature source Tympanic, resp. rate 18, height 5\' 2"  (1.575 m), weight 93.9 kg, SpO2 99 %. Physical Exam  General appearance: Alert, cooperative, in no acute distress Back: Well-healed puncture sites CV: Regular rate and rhythm Pulm: Clear to auscultation  Neurologic exam:  Mental status: alertness: alert, affect: normal Speech: fluent and clear Motor:strength symmetric 5/5 in bilateral lower extremities Sensory: intact to light touch in bilateral lower extremities Gait: normal      Imaging: MRI thoracic spine: There is a normal kyphosis and there is no obvious central stenosis.  Assessment/Plan Plan for percutaneous thoracic SCS lead placement  Cassandra Perla, MD 06/23/2020, 8:20 AM

## 2020-06-23 NOTE — Anesthesia Postprocedure Evaluation (Signed)
Anesthesia Post Note  Patient: Cassandra Brown  Procedure(s) Performed: THORACIC SPINAL CORD STIMULATOR (Bilateral ) LEFT FLANK PULSE GENERATOR IMPLANT (Left )  Patient location during evaluation: PACU Anesthesia Type: General Level of consciousness: awake and alert and oriented Pain management: pain level controlled Vital Signs Assessment: post-procedure vital signs reviewed and stable Respiratory status: spontaneous breathing Cardiovascular status: blood pressure returned to baseline Anesthetic complications: no   No complications documented.   Last Vitals:  Vitals:   06/23/20 1200 06/23/20 1219  BP: 112/72 130/70  Pulse: (!) 55 63  Resp: 16 18  Temp:  (!) 36.1 C  SpO2: 99% 96%    Last Pain:  Vitals:   06/23/20 1219  TempSrc: Tympanic  PainSc: 6                  Taura Lamarre

## 2020-06-24 ENCOUNTER — Encounter: Payer: Self-pay | Admitting: Neurosurgery

## 2020-07-09 ENCOUNTER — Other Ambulatory Visit: Payer: Self-pay | Admitting: Neurology

## 2020-07-09 DIAGNOSIS — G501 Atypical facial pain: Secondary | ICD-10-CM

## 2020-08-09 ENCOUNTER — Ambulatory Visit
Admission: RE | Admit: 2020-08-09 | Discharge: 2020-08-09 | Disposition: A | Discharge status: Home / Self Care | Payer: Medicare Other | Source: Ambulatory Visit | Attending: Neurology | Admitting: Neurology

## 2020-08-09 DIAGNOSIS — G501 Atypical facial pain: Secondary | ICD-10-CM

## 2020-08-09 MED ORDER — GADOBENATE DIMEGLUMINE 529 MG/ML IV SOLN
15.0000 mL | Freq: Once | INTRAVENOUS | Status: AC | PRN
  Administered 2020-08-09: 15 mL via INTRAVENOUS

## 2021-01-06 ENCOUNTER — Other Ambulatory Visit: Payer: Self-pay | Admitting: Pulmonary Disease

## 2021-01-06 DIAGNOSIS — J849 Interstitial pulmonary disease, unspecified: Secondary | ICD-10-CM

## 2021-01-21 ENCOUNTER — Ambulatory Visit
Admission: RE | Admit: 2021-01-21 | Discharge: 2021-01-21 | Disposition: A | Discharge status: Home / Self Care | Payer: Medicare Other | Source: Ambulatory Visit | Attending: Pulmonary Disease | Admitting: Pulmonary Disease

## 2021-01-21 ENCOUNTER — Other Ambulatory Visit: Payer: Self-pay

## 2021-01-21 DIAGNOSIS — J849 Interstitial pulmonary disease, unspecified: Secondary | ICD-10-CM

## 2021-01-21 HISTORY — DX: Systemic involvement of connective tissue, unspecified: M35.9

## 2021-01-21 MED ORDER — IOHEXOL 300 MG/ML  SOLN
75.0000 mL | Freq: Once | INTRAMUSCULAR | Status: AC | PRN
  Administered 2021-01-21: 75 mL via INTRAVENOUS

## 2021-02-23 ENCOUNTER — Encounter
Admission: RE | Admit: 2021-02-23 | Discharge: 2021-02-23 | Disposition: A | Discharge status: Home / Self Care | Payer: Medicare Other | Source: Ambulatory Visit | Attending: Pulmonary Disease | Admitting: Pulmonary Disease

## 2021-02-23 ENCOUNTER — Other Ambulatory Visit: Payer: Self-pay

## 2021-02-23 NOTE — Patient Instructions (Signed)
Your procedure is scheduled on: 02/27/21 Report to [place] stop at the Admitting Desk first. To find out your arrival time please call [phone] between 1PM - 3PM on 02/26/21.  Remember: Instructions that are not followed completely may result in serious medical risk, up to and including death, or upon the discretion of your surgeon and anesthesiologist your surgery may need to be rescheduled.     _X__ 1. Do not eat food or drink any liquids after midnight the night before your procedure.                 No gum chewing or hard candies.   __X__2.  On the morning of surgery brush your teeth with toothpaste and water, you                 may rinse your mouth with mouthwash if you wish.  Do not swallow any              toothpaste of mouthwash.     _X__ 3.  No Alcohol for 24 hours before or after surgery.   _X__ 4.  Do Not Smoke or use e-cigarettes For 24 Hours Prior to Your Surgery.                 Do not use any chewable tobacco products for at least 6 hours prior to                 surgery.  ____  5.  Bring all medications with you on the day of surgery if instructed.   __X__  6.  Notify your doctor if there is any change in your medical condition      (cold, fever, infections).     Do not wear jewelry, make-up, hairpins, clips or nail polish. Do not wear lotions, powders, or perfumes.  Do not shave 48 hours prior to surgery. Men may shave face and neck. Do not bring valuables to the hospital.    [facility] is not responsible for any belongings or valuables.  Contacts, dentures/partials or body piercings may not be worn into surgery. Bring a case for your contacts, glasses or hearing aids, a denture cup will be supplied. Leave your suitcase in the car. After surgery it may be brought to your room. For patients admitted to the hospital, discharge time is determined by your treatment team.   Patients discharged the day of  surgery will not be allowed to drive home.   Please read over the following fact sheets that you were given:     __X__ Take these medicines the morning of surgery with A SIP OF WATER:    1. carisoprodol (SOMA) 350 MG tablet  2. HYDROcodone-acetaminophen (NORCO) 10-325 MG tablet  3. pantoprazole (PROTONIX) 40 MG tablet  4. pentoxifylline (TRENTAL) 400 MG CR tablet  5. predniSONE (DELTASONE) 5 MG tablet  6. pregabalin (LYRICA) 50 MG capsule  7. sotalol (BETAPACE) 80 MG tablet   ____ Fleet Enema (as directed)   __X__ Use CHG Soap/SAGE wipes as directed  __X__ Use inhalers on the day of surgery  ____ Stop metformin/Janumet/Farxiga 2 days prior to surgery    ____ Take 1/2 of usual insulin dose the night before surgery. No insulin the morning          of surgery.   ____ Stop Blood Thinners Coumadin/Plavix/Xarelto/Pleta/Pradaxa/Eliquis/Effient/Aspirin  on   Or contact your Surgeon, Cardiologist or Medical Doctor regarding  ability to stop your blood thinners  __X__ Stop Anti-inflammatories 7 days before surgery such as Advil, Ibuprofen, Motrin,  BC or Goodies Powder, Naprosyn, Naproxen, Aleve, Aspirin    __X__ Stop all herbal supplements, fish oil or vitamin E until after surgery.    ____ Bring C-Pap to the hospital.

## 2021-02-25 ENCOUNTER — Other Ambulatory Visit
Admission: RE | Admit: 2021-02-25 | Discharge: 2021-02-25 | Disposition: A | Discharge status: Home / Self Care | Payer: Medicare Other | Source: Ambulatory Visit | Attending: Pulmonary Disease | Admitting: Pulmonary Disease

## 2021-02-25 ENCOUNTER — Other Ambulatory Visit: Payer: Self-pay

## 2021-02-25 DIAGNOSIS — Z20822 Contact with and (suspected) exposure to covid-19: Secondary | ICD-10-CM | POA: Insufficient documentation

## 2021-02-25 DIAGNOSIS — Z01812 Encounter for preprocedural laboratory examination: Secondary | ICD-10-CM | POA: Insufficient documentation

## 2021-02-25 LAB — SARS CORONAVIRUS 2 (TAT 6-24 HRS): SARS Coronavirus 2: NEGATIVE

## 2021-02-27 ENCOUNTER — Ambulatory Visit: Payer: Medicare Other | Admitting: Anesthesiology

## 2021-02-27 ENCOUNTER — Encounter
Admission: RE | Disposition: A | Discharge status: Home / Self Care | Payer: Self-pay | Source: Home / Self Care | Attending: Pulmonary Disease

## 2021-02-27 ENCOUNTER — Other Ambulatory Visit: Payer: Self-pay

## 2021-02-27 ENCOUNTER — Ambulatory Visit: Payer: Medicare Other

## 2021-02-27 ENCOUNTER — Ambulatory Visit
Admission: RE | Admit: 2021-02-27 | Discharge: 2021-02-27 | Disposition: A | Discharge status: Home / Self Care | Payer: Medicare Other | Attending: Pulmonary Disease | Admitting: Pulmonary Disease

## 2021-02-27 ENCOUNTER — Encounter: Payer: Self-pay | Admitting: *Deleted

## 2021-02-27 DIAGNOSIS — R911 Solitary pulmonary nodule: Secondary | ICD-10-CM | POA: Diagnosis present

## 2021-02-27 DIAGNOSIS — Z882 Allergy status to sulfonamides status: Secondary | ICD-10-CM | POA: Diagnosis not present

## 2021-02-27 DIAGNOSIS — M301 Polyarteritis with lung involvement [Churg-Strauss]: Secondary | ICD-10-CM | POA: Insufficient documentation

## 2021-02-27 DIAGNOSIS — Z79899 Other long term (current) drug therapy: Secondary | ICD-10-CM | POA: Diagnosis not present

## 2021-02-27 DIAGNOSIS — Z885 Allergy status to narcotic agent status: Secondary | ICD-10-CM | POA: Insufficient documentation

## 2021-02-27 DIAGNOSIS — R0489 Hemorrhage from other sites in respiratory passages: Secondary | ICD-10-CM | POA: Diagnosis not present

## 2021-02-27 DIAGNOSIS — Z9889 Other specified postprocedural states: Secondary | ICD-10-CM

## 2021-02-27 DIAGNOSIS — Z886 Allergy status to analgesic agent status: Secondary | ICD-10-CM | POA: Insufficient documentation

## 2021-02-27 DIAGNOSIS — Z87891 Personal history of nicotine dependence: Secondary | ICD-10-CM | POA: Insufficient documentation

## 2021-02-27 DIAGNOSIS — Z888 Allergy status to other drugs, medicaments and biological substances status: Secondary | ICD-10-CM | POA: Diagnosis not present

## 2021-02-27 HISTORY — PX: VIDEO BRONCHOSCOPY WITH ENDOBRONCHIAL NAVIGATION: SHX6175

## 2021-02-27 HISTORY — PX: VIDEO BRONCHOSCOPY WITH ENDOBRONCHIAL ULTRASOUND: SHX6177

## 2021-02-27 LAB — CBC
HCT: 36.1 % (ref 36.0–46.0)
Hemoglobin: 10.8 g/dL — ABNORMAL LOW (ref 12.0–15.0)
MCH: 25 pg — ABNORMAL LOW (ref 26.0–34.0)
MCHC: 29.9 g/dL — ABNORMAL LOW (ref 30.0–36.0)
MCV: 83.6 fL (ref 80.0–100.0)
Platelets: 449 10*3/uL — ABNORMAL HIGH (ref 150–400)
RBC: 4.32 MIL/uL (ref 3.87–5.11)
RDW: 16.6 % — ABNORMAL HIGH (ref 11.5–15.5)
WBC: 13.9 10*3/uL — ABNORMAL HIGH (ref 4.0–10.5)
nRBC: 0 % (ref 0.0–0.2)

## 2021-02-27 LAB — PROTIME-INR
INR: 1 (ref 0.8–1.2)
Prothrombin Time: 12.9 seconds (ref 11.4–15.2)

## 2021-02-27 LAB — POTASSIUM: Potassium: 4.2

## 2021-02-27 LAB — APTT: aPTT: 32 seconds (ref 24–36)

## 2021-02-27 SURGERY — BRONCHOSCOPY, WITH EBUS
Anesthesia: General

## 2021-02-27 MED ORDER — EPHEDRINE 5 MG/ML INJ
INTRAVENOUS | Status: AC
  Filled 2021-02-27: qty 10

## 2021-02-27 MED ORDER — PHENYLEPHRINE HCL (PRESSORS) 10 MG/ML IV SOLN
INTRAVENOUS | Status: AC
  Filled 2021-02-27: qty 1

## 2021-02-27 MED ORDER — PROPOFOL 10 MG/ML IV BOLUS
INTRAVENOUS | Status: DC | PRN
  Administered 2021-02-27: 150 mg via INTRAVENOUS

## 2021-02-27 MED ORDER — ORAL CARE MOUTH RINSE: 15.0000 mL | Freq: Once | OROMUCOSAL | Status: AC

## 2021-02-27 MED ORDER — LIDOCAINE HCL URETHRAL/MUCOSAL 2 % EX GEL
1.0000 "application " | Freq: Once | CUTANEOUS | Status: DC
  Filled 2021-02-27: qty 5

## 2021-02-27 MED ORDER — CHLORHEXIDINE GLUCONATE 0.12 % MT SOLN: 15.0000 mL | Freq: Once | OROMUCOSAL | Status: AC

## 2021-02-27 MED ORDER — METHYLPREDNISOLONE SODIUM SUCC 125 MG IJ SOLR
INTRAMUSCULAR | Status: AC
  Filled 2021-02-27: qty 2

## 2021-02-27 MED ORDER — PHENYLEPHRINE HCL (PRESSORS) 10 MG/ML IV SOLN
INTRAVENOUS | Status: DC | PRN
  Administered 2021-02-27 (×3): 100 ug via INTRAVENOUS

## 2021-02-27 MED ORDER — DEXAMETHASONE SODIUM PHOSPHATE 10 MG/ML IJ SOLN
INTRAMUSCULAR | Status: DC | PRN
  Administered 2021-02-27: 10 mg via INTRAVENOUS

## 2021-02-27 MED ORDER — CHLORHEXIDINE GLUCONATE 0.12 % MT SOLN
OROMUCOSAL | Status: AC
  Administered 2021-02-27: 15 mL via OROMUCOSAL
  Filled 2021-02-27: qty 15

## 2021-02-27 MED ORDER — LIDOCAINE HCL (PF) 1 % IJ SOLN: 30.0000 mL | Freq: Once | INTRAMUSCULAR | Status: DC

## 2021-02-27 MED ORDER — PHENYLEPHRINE HCL 0.25 % NA SOLN
1.0000 | Freq: Four times a day (QID) | NASAL | Status: DC | PRN
  Filled 2021-02-27: qty 15

## 2021-02-27 MED ORDER — DEXAMETHASONE SODIUM PHOSPHATE 10 MG/ML IJ SOLN
INTRAMUSCULAR | Status: AC
  Filled 2021-02-27: qty 1

## 2021-02-27 MED ORDER — LIDOCAINE HCL (PF) 2 % IJ SOLN
INTRAMUSCULAR | Status: AC
  Filled 2021-02-27: qty 5

## 2021-02-27 MED ORDER — ONDANSETRON HCL 4 MG/2ML IJ SOLN: 4.0000 mg | Freq: Once | INTRAMUSCULAR | Status: DC | PRN

## 2021-02-27 MED ORDER — FENTANYL CITRATE (PF) 100 MCG/2ML IJ SOLN
INTRAMUSCULAR | Status: DC | PRN
  Administered 2021-02-27 (×2): 50 ug via INTRAVENOUS

## 2021-02-27 MED ORDER — LACTATED RINGERS IV SOLN: INTRAVENOUS | Status: DC | PRN

## 2021-02-27 MED ORDER — EPHEDRINE SULFATE 50 MG/ML IJ SOLN
INTRAMUSCULAR | Status: DC | PRN
  Administered 2021-02-27: 15 mg via INTRAVENOUS
  Administered 2021-02-27: 10 mg via INTRAVENOUS
  Administered 2021-02-27: 15 mg via INTRAVENOUS

## 2021-02-27 MED ORDER — BUTAMBEN-TETRACAINE-BENZOCAINE 2-2-14 % EX AERO
1.0000 | INHALATION_SPRAY | Freq: Once | CUTANEOUS | Status: DC
  Filled 2021-02-27: qty 20

## 2021-02-27 MED ORDER — ONDANSETRON HCL 4 MG/2ML IJ SOLN
INTRAMUSCULAR | Status: AC
  Filled 2021-02-27: qty 2

## 2021-02-27 MED ORDER — LIDOCAINE HCL (CARDIAC) PF 100 MG/5ML IV SOSY
PREFILLED_SYRINGE | INTRAVENOUS | Status: DC | PRN
  Administered 2021-02-27: 100 mg via INTRAVENOUS

## 2021-02-27 MED ORDER — FENTANYL CITRATE (PF) 100 MCG/2ML IJ SOLN: 25.0000 ug | INTRAMUSCULAR | Status: DC | PRN

## 2021-02-27 MED ORDER — ROCURONIUM BROMIDE 10 MG/ML (PF) SYRINGE
PREFILLED_SYRINGE | INTRAVENOUS | Status: AC
  Filled 2021-02-27: qty 10

## 2021-02-27 MED ORDER — ROCURONIUM BROMIDE 100 MG/10ML IV SOLN
INTRAVENOUS | Status: DC | PRN
  Administered 2021-02-27: 50 mg via INTRAVENOUS
  Administered 2021-02-27: 20 mg via INTRAVENOUS

## 2021-02-27 MED ORDER — PROPOFOL 10 MG/ML IV BOLUS
INTRAVENOUS | Status: AC
  Filled 2021-02-27: qty 20

## 2021-02-27 MED ORDER — ONDANSETRON HCL 4 MG/2ML IJ SOLN
INTRAMUSCULAR | Status: DC | PRN
  Administered 2021-02-27: 4 mg via INTRAVENOUS

## 2021-02-27 MED ORDER — FENTANYL CITRATE (PF) 100 MCG/2ML IJ SOLN
INTRAMUSCULAR | Status: AC
  Filled 2021-02-27: qty 2

## 2021-02-27 MED ORDER — METHYLPREDNISOLONE SODIUM SUCC 125 MG IJ SOLR
40.0000 mg | Freq: Once | INTRAMUSCULAR | Status: AC
  Administered 2021-02-27: 40 mg via INTRAVENOUS

## 2021-02-27 NOTE — OR Nursing (Signed)
Techs in for CXR in postop 1446.

## 2021-02-27 NOTE — Anesthesia Preprocedure Evaluation (Signed)
Anesthesia Evaluation  Patient identified by MRN, date of birth, ID band Patient awake    Reviewed: Allergy & Precautions, H&P , NPO status , Patient's Chart, lab work & pertinent test results, reviewed documented beta blocker date and time   Airway Mallampati: III  TM Distance: >3 FB Neck ROM: full    Dental  (+) Upper Dentures, Lower Dentures   Pulmonary neg pulmonary ROS, asthma , sleep apnea , former smoker,    Pulmonary exam normal        Cardiovascular Exercise Tolerance: Poor + angina with exertion + CAD and +CHF  negative cardio ROS Normal cardiovascular exam+ dysrhythmias Atrial Fibrillation  Rhythm:regular Rate:Normal     Neuro/Psych  Neuromuscular disease negative neurological ROS  negative psych ROS   GI/Hepatic Neg liver ROS, GERD  Medicated,  Endo/Other  negative endocrine ROS  Renal/GU negative Renal ROS  negative genitourinary   Musculoskeletal   Abdominal   Peds  Hematology negative hematology ROS (+)   Anesthesia Other Findings Past Medical History: No date: Allergic rhinitis due to pollen No date: Angina pectoris (HCC) No date: CHF (congestive heart failure) (HCC) No date: Chronic maxillary sinusitis No date: Chronic pain syndrome     Comment:  BACK No date: Chronic right sacroiliac joint pain No date: Collagen vascular disease (HCC) No date: Coronary artery abnormality No date: Dysrhythmia     Comment:  AFIB,  FREQ PVC No date: Gastroparesis No date: GERD (gastroesophageal reflux disease) No date: History of kidney stones No date: Leukocytosis No date: Obesity No date: Patent foramen ovale No date: Rheumatoid arthritis (HCC)     Comment:  OSTEOARTHRITIS No date: Sleep apnea No date: Spinal stenosis of lumbar region No date: Unequal leg length No date: Vasculitis ([place]) No date: Wegener's disease, pulmonary Past Surgical History: No date: APPENDECTOMY No date: CHOLECYSTECTOMY No  date: JOINT REPLACEMENT; Right     Comment:  knee No date: KNEE SURGERY; Left 06/23/2020: PULSE GENERATOR IMPLANT; Left     Comment:  Procedure: LEFT FLANK PULSE GENERATOR IMPLANT;  Surgeon:              Deetta Perla, MD;  Location: [facility] ORS;  Service:               Neurosurgery;  Laterality: Left; No date: SHOULDER SURGERY; Bilateral 06/23/2020: THORACIC LAMINECTOMY FOR SPINAL CORD STIMULATOR; Bilateral     Comment:  Procedure: THORACIC SPINAL CORD STIMULATOR;  Surgeon:               Deetta Perla, MD;  Location: [facility] ORS;  Service:               Neurosurgery;  Laterality: Bilateral;   Reproductive/Obstetrics negative OB ROS                             Anesthesia Physical Anesthesia Plan  ASA: IV  Anesthesia Plan: General ETT   Post-op Pain Management:    Induction:   PONV Risk Score and Plan: 4 or greater  Airway Management Planned:   Additional Equipment:   Intra-op Plan:   Post-operative Plan:   Informed Consent: I have reviewed the patients History and Physical, chart, labs and discussed the procedure including the risks, benefits and alternatives for the proposed anesthesia with the patient or authorized representative who has indicated his/her understanding and acceptance.     Dental Advisory Given  Plan Discussed with: CRNA  Anesthesia Plan Comments:  Anesthesia Quick Evaluation  

## 2021-02-27 NOTE — Transfer of Care (Signed)
Immediate Anesthesia Transfer of Care Note  Patient: Cassandra Brown  Procedure(s) Performed: VIDEO BRONCHOSCOPY WITH ENDOBRONCHIAL ULTRASOUND (N/A ) VIDEO BRONCHOSCOPY WITH ENDOBRONCHIAL NAVIGATION (N/A )  Patient Location: PACU  Anesthesia Type:General  Level of Consciousness: drowsy and patient cooperative  Airway & Oxygen Therapy: Patient Spontanous Breathing and Patient connected to nasal cannula oxygen  Post-op Assessment: Report given to RN and Post -op Vital signs reviewed and stable  Post vital signs: Reviewed and stable  Last Vitals:  Vitals Value Taken Time  BP 117/62 02/27/21 1401  Temp    Pulse 78 02/27/21 1401  Resp 18 02/27/21 1401  SpO2 100 % 02/27/21 1401  Vitals shown include unvalidated device data.  Last Pain:  Vitals:   02/27/21 1135  TempSrc: Oral  PainSc: 8          Complications: No complications documented.

## 2021-02-27 NOTE — Discharge Instructions (Addendum)
Flexible Bronchoscopy  Flexible bronchoscopy is a procedure used to examine the passageways in the lungs. During the procedure, a thin, flexible tool with a camera (bronchoscope) is passed into the mouth or nose, down through the windpipe (trachea), and into the air tubes in the lungs (bronchi). This tool allows the health care provider to look inside the lungs and to take samples for testing, if needed. Tell a health care provider about:  Any allergies you have.  All medicines you are taking, including vitamins, herbs, eye drops, creams, and over-the-counter medicines.  Any problems you or family members have had with anesthetic medicines.  Any blood disorders you have.  Any surgeries you have had.  Any medical conditions you have.  Whether you are pregnant or may be pregnant. What are the risks? Generally, this is a safe procedure. However, problems may occur, including:  Infection.  Bleeding.  Damage to other structures or organs.  Allergic reactions to medicines.  Collapsed lung (pneumothorax).  Increased need for oxygen or difficulty breathing after the procedure. What happens before the procedure? Staying hydrated Follow instructions from your health care provider about hydration, which may include:  Up to 2 hours before the procedure - you may continue to drink clear liquids, such as water, clear fruit juice, black coffee, and plain tea.   Eating and drinking restrictions Follow instructions from your health care provider about eating and drinking, which may include:  8 hours before the procedure - stop eating heavy meals or foods, such as meat, fried foods, or fatty foods.  6 hours before the procedure - stop eating light meals or foods, such as toast or cereal.  6 hours before the procedure - stop drinking milk or drinks that contain milk.  2 hours before the procedure - stop drinking clear liquids. Medicines Ask your health care provider about:  Changing or  stopping your regular medicines. This is especially important if you are taking diabetes medicines or blood thinners.  Taking medicines such as aspirin and ibuprofen. These medicines can thin your blood. Do not take these medicines unless your health care provider tells you to take them.  Taking over-the-counter medicines, vitamins, herbs, and supplements. General instructions  You may be given antibiotic medicine to help lower the risk of infection.  Plan to have a responsible adult take you home from the hospital or clinic.  If you will be going home right after the procedure, plan to have a responsible adult care for you for the time you are told. This is important. What happens during the procedure?  An IV will be inserted into one of your veins.  You will be given a medicine (local anesthetic) to numb your mouth, nose, throat, and voice box (larynx). You may also be given one or more of the following: ? A medicine to help you relax (sedative). ? A medicine to control coughing. ? A medicine to dry up any fluids or secretions in your lungs.  A bronchoscope will be passed into your nose or mouth, and into your lungs. Your health care provider will examine your lungs.  Samples of airway secretions may be collected for testing.  If abnormal areas are seen in your airways, samples of tissue may be removed and checked under a microscope (biopsy).  If tissue samples are needed from the outer parts of the lung, a type of X-ray (fluoroscopy) may be used to guide the bronchoscope to these areas.  If bleeding occurs, you may be given medicine to  stop or decrease the bleeding. The procedure may vary among health care providers and hospitals. What can I expect after the procedure?  Your blood pressure, heart rate, breathing rate, and blood oxygen level will be monitored until you leave the hospital or clinic.  You may have a chest X-ray to check for signs of pneumothorax.  You willnot be  allowed to eat or drink anything for 2 hours after your procedure.  If a biopsy was taken, it is up to you to get the results of the test. Ask your health care provider, or the department that is doing the procedure, when your results will be ready.  You may have the following symptoms for 24-48 hours: ? A cough that is worse than it was before the procedure. ? A low-grade fever. ? A sore throat or hoarse voice. ? Some blood in the mucus from your lungs (sputum), if a biopsy was done. Follow these instructions at home: Eating and drinking  Do not eat or drink anything, including water, for 2 hours after your procedure, or until your numbing medicine has worn off. Having a numb throat increases your risk of burning yourself or choking.  Start eating soft foods and slowly drinking liquids after your numbness is gone and your cough and gag reflexes have returned.  You may return to your normal diet the day after the procedure. Driving  If you were given a sedative during the procedure, it can affect you for several hours. Do not drive or operate machinery until your health care provider says that it is safe.  Ask your health care provider if the medicine prescribed to you requires you to avoid driving or using machinery.  Return to your normal activities as told by your health care provider. Ask your health care provider what activities are safe for you. General instructions  Take over-the-counter and prescription medicines only as told by your health care provider.  Do not use any products that contain nicotine or tobacco. These products include cigarettes, chewing tobacco, and vaping devices, such as e-cigarettes. If you need help quitting, ask your health care provider.  Keep all follow-up visits. This is important.   Get help right away if:  You have shortness of breath that gets worse.  You become light-headed or feel like you might faint.  You have chest pain.  You cough up  more than a small amount of blood. These symptoms may represent a serious problem that is an emergency. Do not wait to see if the symptoms will go away. Get medical help right away. Call your local emergency services (911 in the [place] Do not drive yourself to the hospital. Summary  Flexible bronchoscopy is a procedure that allows your health care provider to look closely inside your lungs and to take testing samples if needed.  Risks of flexible bronchoscopy include bleeding, infection, and collapsed lung (pneumothorax).  Before the procedure, you will be given a medicine to numb your mouth, nose, throat, and voice box. Then, a bronchoscope will be passed into your nose or mouth, and into your lungs.  After the procedure, your blood pressure, heart rate, breathing rate, and blood oxygen level will be monitored until you leave the hospital or clinic. You may have a chest X-ray to check for signs of pneumothorax.  You will not be allowed to eat or drink anything for 2 hours after your procedure. This information is not intended to replace advice given to you by your health care provider.  Make sure you discuss any questions you have with your health care provider. Document Revised: 07/03/2020 Document Reviewed: 07/03/2020 Elsevier Patient Education  2021 [place]   1) The drugs that you were given will stay in your system until tomorrow so for the next 24 hours you should not:  A) Drive an automobile B) Make any legal decisions C) Drink any alcoholic beverage   2) You may resume regular meals tomorrow.  Today it is better to start with liquids and gradually work up to solid foods.  You may eat anything you prefer, but it is better to start with liquids, then soup and crackers, and gradually work up to solid foods.   3) Please notify your doctor immediately if you have any unusual bleeding, trouble breathing, redness and pain at the  surgery site, drainage, fever, or pain not relieved by medication.    4) Additional Instructions:   DR. Lanney Gins SENDING COMPUTER PRESCRIPTIONS TO YOUR PHARMACY, [facility] FOR  CODEINE COUGH SYRUP AND FIVE DAY PREDNISONE.        Please contact your physician with any problems or Same Day Surgery at [phone], Monday through Friday 6 am to 4 pm, or [place] at [facility] number at [phone].

## 2021-02-27 NOTE — OR Nursing (Signed)
Per Dr. Lanney Gins, telephone, patient may resume taking aspirin tomorrow; also advised he will be sending to pt's pharmacy computer rx from [facility] for 5 day supply of prednisone and codeine cough syrup - added to discharge instructions by Probation officer.  MD also advises he does not need to see CXR results prior to pt's discharge.

## 2021-02-27 NOTE — H&P (Signed)
Pulmonary Medicine          Date: 02/27/2021,   MRN# [id number] Cassandra Brown 03-14-1966     Admission                  Current       CHIEF COMPLAINT:   Cavitary lung nodule    HISTORY OF PRESENT ILLNESS   [name] is 55 y.o. female who was referred to [facility] Pulmonary clinic due to Granulomatous lung disease. Hx of RA on MTX and simponi with mild residual arthropathy, chronic prednisone therapy on 5mg  po daily. +PR3 Anca with sinus disease possible GPA related. 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 She had 2 doses of Rituxan. She has peripheral eosinophilia. 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 She is now living in [place]. Patient has OSA and has REsMED device it has been evaluated and is not defective. She is unsure who is her supplier for CPAP but its apparently turns off in middle of night. patient returns for evaluation of GPA and OSA. Patient reports chronic pain, she is masked and triple vaccinated for covid. She is excited about rituxan. Patient had respiratory cultures with heavy growth of staph aureaus she still has dyspnea and sinusitis with sinus/nasal tenderness Patient returns for evaluation of OSA and GPA. She has hx of asthma. She shares that she was on Dupixent in the past and she had adverse effects from it. She is on Rituxan now and MTX. She started Rituxan in Dec 2021. Reviewed imaging with patient. Discussed biopsy and procedure, as well as risk/benefits of procedure.      PAST MEDICAL HISTORY   Past Medical History:  Diagnosis Date  . Allergic rhinitis due to pollen   . Angina pectoris ([place])   . CHF (congestive heart failure) ([place])   . Chronic maxillary sinusitis   . Chronic pain syndrome    BACK  . Chronic right sacroiliac joint pain   . Collagen vascular disease ([place])   . Coronary artery abnormality   . Dysrhythmia    AFIB,  FREQ PVC  . Gastroparesis    . GERD (gastroesophageal reflux disease)   . History of kidney stones   . Leukocytosis   . Obesity   . Patent foramen ovale   . Rheumatoid arthritis ([place])    OSTEOARTHRITIS  . Sleep apnea   . Spinal stenosis of lumbar region   . Unequal leg length   . Vasculitis ([place])   . Wegener's disease, pulmonary      SURGICAL HISTORY   Past Surgical History:  Procedure Laterality Date  . APPENDECTOMY    . CHOLECYSTECTOMY    . JOINT REPLACEMENT Right    knee  . KNEE SURGERY Left   . PULSE GENERATOR IMPLANT Left 06/23/2020   Procedure: LEFT FLANK PULSE GENERATOR IMPLANT;  Surgeon: Deetta Perla, MD;  Location: [facility] ORS;  Service: Neurosurgery;  Laterality: Left;  . SHOULDER SURGERY Bilateral   . THORACIC LAMINECTOMY FOR SPINAL CORD STIMULATOR Bilateral 06/23/2020   Procedure: THORACIC SPINAL CORD STIMULATOR;  Surgeon: Deetta Perla, MD;  Location: [facility] ORS;  Service: Neurosurgery;  Laterality: Bilateral;     FAMILY HISTORY   History reviewed. No pertinent family history.   SOCIAL HISTORY   Social History   Tobacco Use  . Smoking status: Former Research scientist (life sciences)  . Smokeless tobacco: Never Used  Vaping Use  . Vaping Use: Never used  Substance Use Topics  . Alcohol use: Not Currently  . Drug use: Never     MEDICATIONS    Home Medication:    Current Medication:  Current Facility-Administered Medications:  .  chlorhexidine (PERIDEX) 0.12 % solution 15 mL, 15 mL, Mouth/Throat, Once **OR** MEDLINE mouth rinse, 15 mL, Mouth Rinse, Once, [place], [place], MD .  chlorhexidine (PERIDEX) 0.12 % solution, , , ,     ALLERGIES   Cyclobenzaprine, Ibuprofen, Morphine, Meperidine, Sulfa antibiotics, and Onion     REVIEW OF SYSTEMS    Review of Systems:  Gen:  Denies  fever, sweats, chills weigh loss  HEENT: Denies blurred vision, double vision, ear pain, eye pain, hearing loss, nose bleeds, sore throat Cardiac:  No dizziness, chest pain or heaviness, chest tightness,edema Resp:    Denies cough or sputum porduction, shortness of breath,wheezing, hemoptysis,  Gi: Denies swallowing difficulty, stomach pain, nausea or vomiting, diarrhea, constipation, bowel incontinence Gu:  Denies bladder incontinence, burning urine Ext:   Denies Joint pain, stiffness or swelling Skin: Denies  skin rash, easy bruising or bleeding or hives Endoc:  Denies polyuria, polydipsia , polyphagia or weight change Psych:   Denies depression, insomnia or hallucinations   Other:  All other systems negative   VS: Temp 98.1 F (36.7 C) (Oral)      PHYSICAL EXAM    GENERAL:NAD, no fevers, chills, no weakness no fatigue HEAD: Normocephalic, atraumatic.  EYES: Pupils equal, round, reactive to light. Extraocular muscles intact. No scleral icterus.  MOUTH: Moist mucosal membrane. Dentition intact. No abscess noted.  EAR, NOSE, THROAT: Clear without exudates. No external lesions.  NECK: Supple. No thyromegaly. No nodules. No JVD.  PULMONARY:decreased air entry bilaterally  CARDIOVASCULAR: S1 and S2. Regular rate and rhythm. No murmurs, rubs, or gallops. No edema. Pedal pulses 2+ bilaterally.  GASTROINTESTINAL: Soft, nontender, nondistended. No masses. Positive bowel sounds. No hepatosplenomegaly.  MUSCULOSKELETAL: No swelling, clubbing, or edema. Range of motion full in all extremities.  NEUROLOGIC: Cranial nerves II through XII are intact. No gross focal neurological deficits. Sensation intact. Reflexes intact.  SKIN: No ulceration, lesions, rashes, or cyanosis. Skin warm and dry. Turgor intact.  PSYCHIATRIC: Mood, affect within normal limits. The patient is awake, alert and oriented x 3. Insight, judgment intact.       IMAGING    No results found.    ASSESSMENT/PLAN   Cavitary lung nodules There are numerous bilateral pulmonary nodules and masses, the largest of which demonstrates central cavitation. Largest cavitating mass within the left upper lobe measures 4.6 x 3.8 x 3.8 cm,  reference image 69/2 and 77/4. The largest non cavitating nodule within the right middle lobe measures 2.5 x 2.3 by 2.3 cm  -Reviewed risks/complications and benefits with patient, risks include infection, pneumothorax/pneumomediastinum which may require chest tube placement as well as overnight/prolonged hospitalization and possible mechanical ventilation. Other risks include bleeding and very rarely death.  Patient understands risks and wishes to proceed.  Additional questions were answered, and patient is aware that post procedure patient will be going home with family and may experience cough with possible clots on expectoration as well as phlegm which may last few days as well as hoarseness of voice post intubation and mechanical ventilation.     Thank you for allowing me to participate in the care of this patient.   Patient/Family are satisfied with care plan and all questions have been answered.  This document was prepared using Dragon voice recognition software and may include unintentional  dictation errors.     Ottie Glazier, M.D.  Division of [place]

## 2021-02-27 NOTE — Procedures (Signed)
ELECTROMAGNETIC NAVIGATIONAL BRONCHOSCOPY PROCEDURE NOTE  FIBEROPTIC BRONCHOSCOPY WITH BRONCHOALVEOLAR LAVAGE PROCEDURE NOTE     Flexible bronchoscopy was performed  by : Lanney Gins MD  assistance by : 1)Repiratory therapist  and 2)[employer] staff and 3) Anesthesia team and 4) Flouroscopy team and 5) [employer] supporting staff   Indication for the procedure was :  Pre-procedural H&P. The following assessment was performed on the day of the procedure prior to initiating sedation History:  Chest pain n Dyspnea y Hemoptysis n Cough y Fever n Other pertinent items n  Examination Vital signs -reviewed as per nursing documentation today Cardiac    Murmurs: n  Rubs : n  Gallop: n Lungs Wheezing: n Rales : n Rhonchi :y  Other pertinent findings: SOB/hypoxemia due to chronic lung disease   Pre-procedural assessment for Procedural Sedation included: Depth of sedation: As per anesthesia team  ASA Classification:  2 Mallampati airway assessment: 3    Medication list reviewed: y  The patient's interval history was taken and revealed: no new complaints The pre- procedure physical examination revealed: No new findings Refer to prior clinic note for details.  Informed Consent: Informed consent was obtained from:  patient after explanation of procedure and risks, benefits, as well as alternative procedures available.  Explanation of level of sedation and possible transfusion was also provided.    Procedural Preparation: Time out was performed and patient was identified by name and birthdate and procedure to be performed and side for sampling, if any, was specified. Pt was intubated by anesthesia.  The patient was appropriately draped.    Media Information         Document Information  Photos    02/27/2021 12:41  Attached To:  Hospital Encounter on 02/27/21   Source Information  Ottie Glazier, MD  [facility]     Fiberoptic bronchoscopy with  airway inspection and BAL Procedure findings:  Bronchoscope was inserted via ETT  without difficulty.  Posterior oropharynx, epiglottis, arytenoids, false cords and vocal cords were not visualized as these were bypassed by endotracheal tube. The distal trachea was normal in circumference and appearance without mucosal, cartilaginous or branching abnormalities.  The main carina was mildly splayed . All right and left lobar airways were visualized to the Subsegmental level.  Sub- sub segmental carinae were identified in all the distal airways.   Secretions were visible in the following airways and appeared to be clear.  The mucosa was : friable at RUL  Airways were notable for:        exophytic lesions :n       extrinsic compression in the following distributions: n.       Friable mucosa: y       Neurosurgeon /pigmentation: n     Post procedure Diagnosis:   Large multi nodular lesion or trachea with mucoid phlegm throughout and edema of airways bilaterally       Electromagnetic Navigational Bronchoscopy Procedure Findings:  After appropriate CT-guided planning ENB scope was advanced via endotracheal tube and LG was advanced for registration.  Post appropriate planning and registration peripheral navigation was used to visualize target lesion.    Initially lesion at LUL cavitary lesion with cytobrush x 3 and surgical forceps biopsy x 4 and then BAL x 1  Next lesion at peripheral RML lesion was targeted with sampling via microbrush x 1, cytobrush x 3, surgical forceps biopsy x 3   Next conglomerate of large sized nodules at trachea was biopsed with large forceps x 8 and  cytobrushed x 2   Post procedure diagnosis:    Granulomatous lung disease      Post procedure diagnosis:  Granulomatous lung disease   Specimens obtained included:                                    Microbiology brushes: RML ; sent for AFB, mycology, aspergillus, anaerobic and aerobic cultures                        Cytology brushes : LUL, RML Tracheal lesion  Broncho-alveolar lavage site:RML and LUL  sent for cyto and micro                             6m volume infused 415mvolume returned with bloody cellular appearance  Fluoroscopy Used: yes ;        Pictorial documentation attached: yes     Immediate sampling complications included:zero  Epinephrine zero ml was used topically  The bronchoscopy was terminated due to completion of the planned procedure and the bronchoscope was removed.   Total dosage of Lidocaine was zero mg Total fluoroscopy time was 1  minutes    Estimated Blood loss: 3 excpected cc.  Complications included:  none immediate    Disposition: spoke with mother of patient PaFraser DinFollow up with Dr. AlLanney Ginsn 5 days for result discussion.     FuOttie GlazierD  [facility]

## 2021-02-27 NOTE — Anesthesia Procedure Notes (Signed)
Procedure Name: Intubation Date/Time: 02/27/2021 12:29 PM Performed by: Jonna Clark, CRNA Pre-anesthesia Checklist: Patient identified, Patient being monitored, Timeout performed, Emergency Drugs available and Suction available Patient Re-evaluated:Patient Re-evaluated prior to induction Oxygen Delivery Method: Circle system utilized Preoxygenation: Pre-oxygenation with 100% oxygen Induction Type: IV induction Ventilation: Mask ventilation without difficulty Laryngoscope Size: 3 and McGraph Grade View: Grade I Tube type: Oral Tube size: 8.5 mm Number of attempts: 1 Airway Equipment and Method: Stylet Placement Confirmation: ETT inserted through vocal cords under direct vision,  positive ETCO2 and breath sounds checked- equal and bilateral Secured at: 20 cm Tube secured with: Tape Dental Injury: Teeth and Oropharynx as per pre-operative assessment

## 2021-03-02 ENCOUNTER — Encounter: Payer: Self-pay | Admitting: Pulmonary Disease

## 2021-03-02 LAB — CULTURE, BAL-QUANTITATIVE W GRAM STAIN: Culture: NO GROWTH

## 2021-03-03 LAB — CYTOLOGY - NON PAP

## 2021-03-03 LAB — SURGICAL PATHOLOGY

## 2021-03-03 NOTE — Anesthesia Postprocedure Evaluation (Signed)
Anesthesia Post Note  Patient: Cassandra Brown  Procedure(s) Performed: VIDEO BRONCHOSCOPY WITH ENDOBRONCHIAL ULTRASOUND (N/A ) VIDEO BRONCHOSCOPY WITH ENDOBRONCHIAL NAVIGATION (N/A )  Patient location during evaluation: PACU Anesthesia Type: General Level of consciousness: awake and alert Pain management: pain level controlled Vital Signs Assessment: post-procedure vital signs reviewed and stable Respiratory status: spontaneous breathing, nonlabored ventilation, respiratory function stable and patient connected to nasal cannula oxygen Cardiovascular status: blood pressure returned to baseline and stable Postop Assessment: no apparent nausea or vomiting Anesthetic complications: no   No complications documented.   Last Vitals:  Vitals:   02/27/21 1439 02/27/21 1525  BP: (!) 126/58 (!) 121/51  Pulse: 80 81  Resp: 18 18  Temp: 36.9 C   SpO2: 97% 96%    Last Pain:  Vitals:   03/01/21 1032  TempSrc:   PainSc: 0-No pain                 Molli Barrows

## 2021-03-05 LAB — ACID FAST SMEAR (AFB, MYCOBACTERIA): Acid Fast Smear: NEGATIVE

## 2021-03-05 LAB — ASPERGILLUS ANTIGEN, BAL/SERUM: Aspergillus Ag, BAL/Serum: 0.58 Index — ABNORMAL HIGH (ref 0.00–0.49)

## 2021-03-11 HISTORY — PX: LEFT ATRIAL APPENDAGE OCCLUSION: EP1229

## 2021-04-18 LAB — ACID FAST CULTURE WITH REFLEXED SENSITIVITIES (MYCOBACTERIA): Acid Fast Culture: NEGATIVE

## 2021-04-30 ENCOUNTER — Encounter: Payer: Self-pay | Admitting: Pulmonary Disease

## 2021-04-30 ENCOUNTER — Ambulatory Visit: Payer: Medicare Other | Admitting: Pulmonary Disease

## 2021-04-30 ENCOUNTER — Other Ambulatory Visit: Payer: Self-pay

## 2021-04-30 VITALS — BP 118/62 | HR 73 | Temp 96.9°F | Ht 60.0 in | Wt 210.2 lb

## 2021-04-30 DIAGNOSIS — Z95818 Presence of other cardiac implants and grafts: Secondary | ICD-10-CM

## 2021-04-30 DIAGNOSIS — M313 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: Secondary | ICD-10-CM | POA: Diagnosis not present

## 2021-04-30 NOTE — Progress Notes (Signed)
Subjective:    Patient ID: Cassandra Brown, female    DOB: [birth year], 55 y.o.   MRN: [id number]    Chief Complaint  Patient presents with  . Consult    Nodules on trachea, SOB, cough with drainage.     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  She is kindly referred by Dr. Claudette Stapler for consideration of this issue.  Currently the patient is asymptomatic with regards to this finding.  I did review the bronchoscopic images with Dr. Claudette Stapler previously.  The patient could potentially benefit from spray cryotherapy to the granulomatous area as noted anteriorly in her trachea.  However the patient has recently undergone a watchman procedure (16 April) and is currently on antiplatelet agents which cannot be stopped until this is cleared by her interventional cardiologist.  In addition she is not symptomatic in this regard and cites that she has had many procedures recently and would like to "take a break".  She has upcoming procedures that will require general anesthesia and she would like to defer further instrumentation until these procedures have been completed.  I agree with this.  She has not had any recent fevers, chills or sweats.  Cough or sputum production.  No hemoptysis.   Review of Systems A 10 point review of systems was performed and it is as noted above otherwise negative.  Past Medical History:  Diagnosis Date  . Allergic rhinitis due to pollen   . Angina pectoris ([place])   . CHF (congestive heart failure) ([place])   . Chronic maxillary sinusitis   . Chronic pain syndrome    BACK  . Chronic right sacroiliac joint pain   . Collagen vascular disease ([place])   .  Coronary artery abnormality   . Dysrhythmia    AFIB,  FREQ PVC  . Gastroparesis   . GERD (gastroesophageal reflux disease)   . History of kidney stones   . Leukocytosis   . Obesity   . Patent foramen ovale   . Rheumatoid arthritis ([place])    OSTEOARTHRITIS  . Sleep apnea   . Spinal stenosis of lumbar region   . Unequal leg length   . Vasculitis ([place])   . Wegener's disease, pulmonary    Past Surgical History:  Procedure Laterality Date  . APPENDECTOMY    . CHOLECYSTECTOMY    . JOINT REPLACEMENT Right    knee  . KNEE SURGERY Left   . PULSE GENERATOR IMPLANT Left 06/23/2020   Procedure: LEFT FLANK PULSE GENERATOR IMPLANT;  Surgeon: Deetta Perla, MD;  Location: [facility] ORS;  Service: Neurosurgery;  Laterality: Left;  . SHOULDER SURGERY Bilateral   . THORACIC LAMINECTOMY FOR SPINAL CORD STIMULATOR Bilateral 06/23/2020   Procedure: THORACIC SPINAL CORD STIMULATOR;  Surgeon: Deetta Perla, MD;  Location: [facility] ORS;  Service: Neurosurgery;  Laterality: Bilateral;  . VIDEO BRONCHOSCOPY WITH ENDOBRONCHIAL NAVIGATION N/A 02/27/2021   Procedure: VIDEO BRONCHOSCOPY WITH ENDOBRONCHIAL NAVIGATION;  Surgeon: Ottie Glazier, MD;  Location: [facility] ORS;  Service: Thoracic;  Laterality: N/A;  . VIDEO BRONCHOSCOPY WITH ENDOBRONCHIAL ULTRASOUND N/A 02/27/2021   Procedure: VIDEO BRONCHOSCOPY WITH ENDOBRONCHIAL ULTRASOUND;  Surgeon: Ottie Glazier, MD;  Location: [facility] ORS;  Service: Thoracic;  Laterality: N/A;   History reviewed. No pertinent family history.  Social  History   Tobacco Use  . Smoking status: Former Smoker    Packs/day: 3.00    Quit date: 02/06/2011    Years since quitting: 10.2  . Smokeless tobacco: Never Used  Substance Use Topics  . Alcohol use: Not Currently   Allergies  Allergen Reactions  . Cyclobenzaprine Hives  . Ibuprofen Hives  . Morphine Itching and Swelling    Other reaction(s): itching/swelling   . Meperidine Itching and Swelling  . Sulfa Antibiotics Swelling  . Onion  Itching    Can use onion powder   Current Meds  Medication Sig  . BAC 50-325-40 MG tablet Take 1 tablet by mouth every 4 (four) hours as needed for headache.  . bumetanide (BUMEX) 2 MG tablet Take 2 mg by mouth daily.  . carisoprodol (SOMA) 350 MG tablet Take 350 mg by mouth 3 (three) times daily.  . cholecalciferol (VITAMIN D3) 25 MCG (1000 UNIT) tablet Take 1,000 Units by mouth daily.  . clobetasol cream (TEMOVATE) 8.41 % Apply 1 application topically 2 (two) times daily as needed (rash).  Marland Kitchen EMGALITY 120 MG/ML SOAJ Inject 120 mg into the skin every 28 (twenty-eight) days.  . folic acid (FOLVITE) 1 MG tablet Take 1 mg by mouth daily.  . furosemide (LASIX) 20 MG tablet Take 20 mg by mouth daily.  Marland Kitchen gentamicin ointment (GARAMYCIN) 0.1 % Apply 1 application topically daily as needed (rash).  Marland Kitchen HYDROcodone-acetaminophen (NORCO) 10-325 MG tablet Take 1 tablet by mouth every 6 (six) hours.  . methotrexate (RHEUMATREX) 2.5 MG tablet Take 25 mg by mouth every Friday. Caution:Chemotherapy. Protect from light.  . methotrexate 50 MG/2ML injection Inject 25 mg into the vein once a week.  . metolazone (ZAROXOLYN) 5 MG tablet Take 5 mg by mouth daily as needed (fluid).  . montelukast (SINGULAIR) 10 MG tablet Take 10 mg by mouth at bedtime.  . NURTEC 75 MG TBDP Take 75 mg by mouth daily as needed for migraine.  . pantoprazole (PROTONIX) 40 MG tablet Take 40 mg by mouth daily.  . pentoxifylline (TRENTAL) 400 MG CR tablet Take 400 mg by mouth in the morning and at bedtime.  . pimecrolimus (ELIDEL) 1 % cream Apply 1 application topically 2 (two) times daily as needed (rash).  . predniSONE (DELTASONE) 5 MG tablet Take 5 mg by mouth daily with breakfast.  . pregabalin (LYRICA) 50 MG capsule Take 50 mg by mouth 2 (two) times daily.   . promethazine (PHENERGAN) 25 MG tablet Take 25 mg by mouth every 6 (six) hours as needed for nausea or vomiting.   . propranolol ER (INDERAL LA) 80 MG 24 hr capsule Take 80 mg by  mouth at bedtime.  . rosuvastatin (CRESTOR) 10 MG tablet Take 10 mg by mouth at bedtime.  . sodium chloride (OCEAN) 0.65 % SOLN nasal spray Place 1 spray into both nostrils as needed for congestion.  . sotalol (BETAPACE) 80 MG tablet Take 80 mg by mouth daily.  Marland Kitchen Specialty Vitamins Products (CENTRUM PERFORMANCE) TABS Take 1 tablet by mouth daily.  Marland Kitchen spironolactone (ALDACTONE) 25 MG tablet Take 25 mg by mouth daily.  . sucralfate (CARAFATE) 1 g tablet Take 1 g by mouth 2 (two) times daily before a meal.  . TRELEGY ELLIPTA 100-62.5-25 MCG/INH AEPB Inhale 1 puff into the lungs daily.  Marland Kitchen triamcinolone cream (KENALOG) 0.1 % Apply 1 application topically daily as needed (itching).    Immunization History  Administered Date(s) Administered  . Influenza, Seasonal, Injecte, Preservative Fre 10/29/2008  .  Influenza,inj,Quad PF,6+ Mos 10/03/2015, 10/26/2016, 10/13/2017, 09/29/2018, 10/10/2020  . Influenza,inj,quad, With Preservative 12/17/2014  . Influenza-Unspecified 10/03/2015, 10/26/2016, 10/13/2017, 09/29/2018, 10/04/2019  . PFIZER(Purple Top)SARS-COV-2 Vaccination 03/13/2020, 04/03/2020, 08/19/2020  . Pneumococcal Polysaccharide-23 08/03/2016  . Tdap 11/05/2016       Objective:   Physical Exam BP 118/62 (BP Location: Left Arm, Patient Position: Sitting, Cuff Size: Normal)   Pulse 73   Temp (!) 96.9 F (36.1 C) (Temporal)   Ht 5' (1.524 m)   Wt 210 lb 3.2 oz (95.3 kg)   SpO2 98%   BMI 41.05 kg/m  GENERAL: Obese woman, no acute distress, fully ambulatory, no conversational dyspnea. HEAD: Normocephalic, atraumatic.  EYES: Pupils equal, round, reactive to light.  No scleral icterus.  MOUTH: Nose/mouth/throat not examined due to masking requirements for COVID 19. NECK: Supple. No thyromegaly. Trachea midline. No JVD.  No adenopathy. PULMONARY: Good air entry bilaterally.  Coarse otherwise, no adventitious sounds.  No stridor. CARDIOVASCULAR: S1 and S2. Regular rate and rhythm.  No rubs,  murmurs or gallops heard. ABDOMEN: Obese otherwise benign. MUSCULOSKELETAL: No joint deformity, no clubbing, no edema.  NEUROLOGIC: No focal deficit, no gait disturbance, speech is fluent. SKIN: Intact,warm,dry. PSYCH: Mood and behavior normal.  I have reviewed the bronchoscopy photos and notes from 27 February 2021.  These were also previously discussed with Dr. Claudette Stapler.     Assessment & Plan:     ICD-10-CM   1. Granulomatosis with polyangiitis with pulmonary involvement ([place])  M31.30    Tracheal involvement noted Tracheal stenosis Patient wants to defer procedure for now  2. Presence of Watchman left atrial appendage closure device  Z95.818    Placed 16 April Patient not clear yet for discontinuation of antiplatelet   This is a very complex patient.  I am very appreciative of Dr. Claudette Stapler for his kind referral.  The patient however needs to withhold procedures until she is cleared from the [facility] procedure recently performed.  We have agreed to reconvene on 22 June to discuss potential cryoablation of the lesions in the trachea.     Renold Don, MD [facility]   *This note was dictated using voice recognition software/Dragon.  Despite best efforts to proofread, errors can occur which can change the meaning.  Any change was purely unintentional.

## 2021-04-30 NOTE — Progress Notes (Signed)
amb  

## 2021-04-30 NOTE — Patient Instructions (Signed)
We will reconvene in on 22 June to discuss potential cryoablation of the lesions in your trachea.  Currently you are not having difficulties with and you have many procedures coming up that require anesthesia so we will reassess you at that time.

## 2021-05-08 ENCOUNTER — Encounter: Payer: Self-pay | Admitting: Pulmonary Disease

## 2021-05-11 ENCOUNTER — Ambulatory Visit: Admission: RE | Admit: 2021-05-11 | Payer: Medicare Other | Source: Home / Self Care | Admitting: Internal Medicine

## 2021-05-11 ENCOUNTER — Encounter: Admission: RE | Payer: Self-pay | Source: Home / Self Care

## 2021-05-11 SURGERY — EGD (ESOPHAGOGASTRODUODENOSCOPY)
Anesthesia: General

## 2021-05-13 ENCOUNTER — Emergency Department: Payer: Medicare Other | Admitting: Certified Registered Nurse Anesthetist

## 2021-05-13 ENCOUNTER — Observation Stay
Admission: EM | Admit: 2021-05-13 | Discharge: 2021-05-14 | Disposition: A | Discharge status: Home / Self Care | Payer: Medicare Other | Attending: Internal Medicine | Admitting: Internal Medicine

## 2021-05-13 ENCOUNTER — Emergency Department: Payer: Medicare Other

## 2021-05-13 ENCOUNTER — Other Ambulatory Visit: Payer: Self-pay

## 2021-05-13 ENCOUNTER — Encounter
Admission: EM | Disposition: A | Discharge status: Home / Self Care | Payer: Self-pay | Source: Home / Self Care | Attending: Emergency Medicine

## 2021-05-13 DIAGNOSIS — D649 Anemia, unspecified: Secondary | ICD-10-CM | POA: Diagnosis present

## 2021-05-13 DIAGNOSIS — D72829 Elevated white blood cell count, unspecified: Secondary | ICD-10-CM | POA: Diagnosis not present

## 2021-05-13 DIAGNOSIS — Z79899 Other long term (current) drug therapy: Secondary | ICD-10-CM | POA: Insufficient documentation

## 2021-05-13 DIAGNOSIS — I482 Chronic atrial fibrillation, unspecified: Secondary | ICD-10-CM | POA: Diagnosis present

## 2021-05-13 DIAGNOSIS — J4520 Mild intermittent asthma, uncomplicated: Secondary | ICD-10-CM | POA: Diagnosis not present

## 2021-05-13 DIAGNOSIS — Z7982 Long term (current) use of aspirin: Secondary | ICD-10-CM | POA: Diagnosis not present

## 2021-05-13 DIAGNOSIS — J45909 Unspecified asthma, uncomplicated: Secondary | ICD-10-CM | POA: Diagnosis not present

## 2021-05-13 DIAGNOSIS — Z87891 Personal history of nicotine dependence: Secondary | ICD-10-CM | POA: Diagnosis not present

## 2021-05-13 DIAGNOSIS — R040 Epistaxis: Principal | ICD-10-CM | POA: Diagnosis present

## 2021-05-13 DIAGNOSIS — I5032 Chronic diastolic (congestive) heart failure: Secondary | ICD-10-CM | POA: Diagnosis not present

## 2021-05-13 DIAGNOSIS — I2510 Atherosclerotic heart disease of native coronary artery without angina pectoris: Secondary | ICD-10-CM | POA: Diagnosis not present

## 2021-05-13 DIAGNOSIS — M313 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: Secondary | ICD-10-CM | POA: Diagnosis present

## 2021-05-13 DIAGNOSIS — M069 Rheumatoid arthritis, unspecified: Secondary | ICD-10-CM | POA: Diagnosis present

## 2021-05-13 HISTORY — PX: NASAL HEMORRHAGE CONTROL: SHX287

## 2021-05-13 LAB — BASIC METABOLIC PANEL
Anion gap: 11 (ref 5–15)
BUN: 29 mg/dL — ABNORMAL HIGH (ref 6–20)
CO2: 27 mmol/L (ref 22–32)
Calcium: 9.3 mg/dL (ref 8.9–10.3)
Chloride: 103 mmol/L (ref 98–111)
Creatinine, Ser: 1.05 mg/dL — ABNORMAL HIGH (ref 0.44–1.00)
GFR, Estimated: >60 mL/min (ref >60)
Glucose, Bld: 137 mg/dL — ABNORMAL HIGH (ref 70–99)
Potassium: 3.7 mmol/L (ref 3.5–5.1)
Sodium: 141 mmol/L (ref 135–145)

## 2021-05-13 LAB — TYPE AND SCREEN
ABO/RH(D): O POS
Antibody Screen: NEGATIVE

## 2021-05-13 LAB — CBC
HCT: 35.9 % — ABNORMAL LOW (ref 36.0–46.0)
HCT: 30.5 % — ABNORMAL LOW (ref 36.0–46.0)
HCT: 28 % — ABNORMAL LOW (ref 36.0–46.0)
Hemoglobin: 11.2 g/dL — ABNORMAL LOW (ref 12.0–15.0)
Hemoglobin: 9.3 g/dL — ABNORMAL LOW (ref 12.0–15.0)
Hemoglobin: 8.7 g/dL — ABNORMAL LOW (ref 12.0–15.0)
MCH: 26.9 pg (ref 26.0–34.0)
MCH: 27.2 pg (ref 26.0–34.0)
MCH: 26.9 pg (ref 26.0–34.0)
MCHC: 31.2 g/dL (ref 30.0–36.0)
MCHC: 30.5 g/dL (ref 30.0–36.0)
MCHC: 31.1 g/dL (ref 30.0–36.0)
MCV: 86.3 fL (ref 80.0–100.0)
MCV: 89.2 fL (ref 80.0–100.0)
MCV: 86.4 fL (ref 80.0–100.0)
Platelets: 369 10*3/uL (ref 150–400)
Platelets: 259 10*3/uL (ref 150–400)
Platelets: 264 10*3/uL (ref 150–400)
RBC: 4.16 MIL/uL (ref 3.87–5.11)
RBC: 3.42 MIL/uL — ABNORMAL LOW (ref 3.87–5.11)
RBC: 3.24 MIL/uL — ABNORMAL LOW (ref 3.87–5.11)
RDW: 18.8 % — ABNORMAL HIGH (ref 11.5–15.5)
RDW: 18.6 % — ABNORMAL HIGH (ref 11.5–15.5)
RDW: 18.4 % — ABNORMAL HIGH (ref 11.5–15.5)
WBC: 16.8 10*3/uL — ABNORMAL HIGH (ref 4.0–10.5)
WBC: 15 10*3/uL — ABNORMAL HIGH (ref 4.0–10.5)
WBC: 15.6 10*3/uL — ABNORMAL HIGH (ref 4.0–10.5)
nRBC: 0 % (ref 0.0–0.2)
nRBC: 0 % (ref 0.0–0.2)
nRBC: 0 % (ref 0.0–0.2)

## 2021-05-13 LAB — PROTIME-INR
INR: 1 (ref 0.8–1.2)
Prothrombin Time: 13.5 seconds (ref 11.4–15.2)

## 2021-05-13 LAB — TROPONIN I (HIGH SENSITIVITY)
Troponin I (High Sensitivity): 8 ng/L (ref <18)
Troponin I (High Sensitivity): 6 ng/L (ref <18)

## 2021-05-13 LAB — APTT: aPTT: 23 seconds — ABNORMAL LOW (ref 24–36)

## 2021-05-13 LAB — BRAIN NATRIURETIC PEPTIDE: B Natriuretic Peptide: 29.8 pg/mL (ref 0.0–100.0)

## 2021-05-13 SURGERY — CONTROL OF EPISTAXIS
Anesthesia: General

## 2021-05-13 MED ORDER — CARISOPRODOL 350 MG PO TABS
350.0000 mg | ORAL_TABLET | Freq: Three times a day (TID) | ORAL | Status: DC
  Administered 2021-05-13 – 2021-05-14 (×3): 350 mg via ORAL
  Filled 2021-05-13 (×3): qty 1

## 2021-05-13 MED ORDER — PHENYLEPHRINE HCL (PRESSORS) 10 MG/ML IV SOLN
INTRAVENOUS | Status: DC | PRN
  Administered 2021-05-13: 200 ug via INTRAVENOUS
  Administered 2021-05-13 (×3): 100 ug via INTRAVENOUS

## 2021-05-13 MED ORDER — SPIRONOLACTONE 25 MG PO TABS
25.0000 mg | ORAL_TABLET | Freq: Every day | ORAL | Status: DC
  Administered 2021-05-14: 25 mg via ORAL
  Filled 2021-05-13: qty 1

## 2021-05-13 MED ORDER — PREDNISONE 10 MG PO TABS
30.0000 mg | ORAL_TABLET | Freq: Every day | ORAL | Status: DC
  Filled 2021-05-13: qty 3

## 2021-05-13 MED ORDER — PHENYLEPHRINE HCL (PRESSORS) 10 MG/ML IV SOLN
INTRAVENOUS | Status: AC
  Filled 2021-05-13: qty 1

## 2021-05-13 MED ORDER — DEXAMETHASONE SODIUM PHOSPHATE 10 MG/ML IJ SOLN
INTRAMUSCULAR | Status: DC | PRN
  Administered 2021-05-13: 10 mg via INTRAVENOUS

## 2021-05-13 MED ORDER — ONDANSETRON HCL 4 MG/2ML IJ SOLN
4.0000 mg | Freq: Once | INTRAMUSCULAR | Status: AC
  Administered 2021-05-13: 4 mg via INTRAVENOUS
  Filled 2021-05-13: qty 2

## 2021-05-13 MED ORDER — UMECLIDINIUM BROMIDE 62.5 MCG/INH IN AEPB
1.0000 | INHALATION_SPRAY | Freq: Every day | RESPIRATORY_TRACT | Status: DC
  Administered 2021-05-14: 1 via RESPIRATORY_TRACT
  Filled 2021-05-13: qty 7

## 2021-05-13 MED ORDER — BUMETANIDE 1 MG PO TABS: 2.0000 mg | ORAL_TABLET | Freq: Every day | ORAL | Status: DC

## 2021-05-13 MED ORDER — PREDNISONE 5 MG PO TABS
15.0000 mg | ORAL_TABLET | Freq: Every day | ORAL | Status: DC
  Filled 2021-05-13: qty 1

## 2021-05-13 MED ORDER — PROPOFOL 10 MG/ML IV BOLUS
INTRAVENOUS | Status: DC | PRN
  Administered 2021-05-13: 120 mg via INTRAVENOUS

## 2021-05-13 MED ORDER — SILVER NITRATE-POT NITRATE 75-25 % EX MISC
1.0000 | Freq: Once | CUTANEOUS | Status: DC
  Filled 2021-05-13: qty 30

## 2021-05-13 MED ORDER — LIDOCAINE-EPINEPHRINE 1 %-1:100000 IJ SOLN
INTRAMUSCULAR | Status: DC | PRN
  Administered 2021-05-13: 1 mL

## 2021-05-13 MED ORDER — PREGABALIN 50 MG PO CAPS
50.0000 mg | ORAL_CAPSULE | Freq: Two times a day (BID) | ORAL | Status: DC
  Administered 2021-05-13 – 2021-05-14 (×2): 50 mg via ORAL
  Filled 2021-05-13 (×2): qty 1

## 2021-05-13 MED ORDER — HYDROCORTISONE NA SUCCINATE PF 100 MG IJ SOLR
50.0000 mg | Freq: Once | INTRAMUSCULAR | Status: AC
  Administered 2021-05-13: 50 mg via INTRAVENOUS
  Filled 2021-05-13: qty 1

## 2021-05-13 MED ORDER — RIMEGEPANT SULFATE 75 MG PO TBDP: 75.0000 mg | ORAL_TABLET | Freq: Every day | ORAL | Status: DC | PRN

## 2021-05-13 MED ORDER — FENTANYL CITRATE (PF) 100 MCG/2ML IJ SOLN
INTRAMUSCULAR | Status: AC
  Filled 2021-05-13: qty 2

## 2021-05-13 MED ORDER — FUROSEMIDE 20 MG PO TABS
20.0000 mg | ORAL_TABLET | Freq: Every day | ORAL | Status: DC
  Administered 2021-05-14: 20 mg via ORAL
  Filled 2021-05-13 (×2): qty 1

## 2021-05-13 MED ORDER — HYDRALAZINE HCL 20 MG/ML IJ SOLN
5.0000 mg | INTRAMUSCULAR | Status: DC | PRN
  Filled 2021-05-13: qty 0.25

## 2021-05-13 MED ORDER — TRANEXAMIC ACID FOR EPISTAXIS
500.0000 mg | Freq: Once | TOPICAL | Status: AC
  Administered 2021-05-13: 500 mg via TOPICAL
  Filled 2021-05-13: qty 10

## 2021-05-13 MED ORDER — SODIUM CHLORIDE 0.9 % IV BOLUS
1000.0000 mL | Freq: Once | INTRAVENOUS | Status: AC
  Administered 2021-05-13: 1000 mL via INTRAVENOUS

## 2021-05-13 MED ORDER — MIDAZOLAM HCL 2 MG/2ML IJ SOLN
INTRAMUSCULAR | Status: DC | PRN
  Administered 2021-05-13: 1 mg via INTRAVENOUS

## 2021-05-13 MED ORDER — FENTANYL CITRATE (PF) 100 MCG/2ML IJ SOLN
50.0000 ug | Freq: Once | INTRAMUSCULAR | Status: AC
  Administered 2021-05-13: 50 ug via INTRAVENOUS
  Filled 2021-05-13: qty 2

## 2021-05-13 MED ORDER — PANTOPRAZOLE SODIUM 40 MG PO TBEC
40.0000 mg | DELAYED_RELEASE_TABLET | Freq: Every day | ORAL | Status: DC
  Administered 2021-05-14: 40 mg via ORAL
  Filled 2021-05-13: qty 1

## 2021-05-13 MED ORDER — ONDANSETRON HCL 4 MG/2ML IJ SOLN: 4.0000 mg | Freq: Once | INTRAMUSCULAR | Status: DC | PRN

## 2021-05-13 MED ORDER — PROMETHAZINE HCL 25 MG PO TABS: 25.0000 mg | ORAL_TABLET | Freq: Four times a day (QID) | ORAL | Status: DC | PRN

## 2021-05-13 MED ORDER — MONTELUKAST SODIUM 10 MG PO TABS
10.0000 mg | ORAL_TABLET | Freq: Every day | ORAL | Status: DC
  Administered 2021-05-13: 10 mg via ORAL
  Filled 2021-05-13: qty 1

## 2021-05-13 MED ORDER — SUCCINYLCHOLINE CHLORIDE 20 MG/ML IJ SOLN
INTRAMUSCULAR | Status: DC | PRN
  Administered 2021-05-13: 140 mg via INTRAVENOUS

## 2021-05-13 MED ORDER — SUCRALFATE 1 G PO TABS
1.0000 g | ORAL_TABLET | Freq: Two times a day (BID) | ORAL | Status: DC
  Administered 2021-05-13 – 2021-05-14 (×2): 1 g via ORAL
  Filled 2021-05-13 (×2): qty 1

## 2021-05-13 MED ORDER — EPHEDRINE SULFATE 50 MG/ML IJ SOLN
INTRAMUSCULAR | Status: DC | PRN
  Administered 2021-05-13: 10 mg via INTRAVENOUS
  Administered 2021-05-13: 5 mg via INTRAVENOUS
  Administered 2021-05-13: 15 mg via INTRAVENOUS

## 2021-05-13 MED ORDER — FENTANYL CITRATE (PF) 100 MCG/2ML IJ SOLN
25.0000 ug | INTRAMUSCULAR | Status: DC | PRN
  Administered 2021-05-13: 25 ug via INTRAVENOUS

## 2021-05-13 MED ORDER — HYDROCODONE-ACETAMINOPHEN 10-325 MG PO TABS
1.0000 | ORAL_TABLET | Freq: Four times a day (QID) | ORAL | Status: DC
  Administered 2021-05-13 – 2021-05-14 (×3): 1 via ORAL
  Filled 2021-05-13 (×3): qty 1

## 2021-05-13 MED ORDER — VITAMIN D 25 MCG (1000 UNIT) PO TABS
1000.0000 [IU] | ORAL_TABLET | Freq: Every day | ORAL | Status: DC
  Administered 2021-05-14: 1000 [IU] via ORAL
  Filled 2021-05-13: qty 1

## 2021-05-13 MED ORDER — ALBUTEROL SULFATE HFA 108 (90 BASE) MCG/ACT IN AERS
2.0000 | INHALATION_SPRAY | RESPIRATORY_TRACT | Status: DC | PRN
  Filled 2021-05-13: qty 6.7

## 2021-05-13 MED ORDER — SOTALOL HCL 80 MG PO TABS
40.0000 mg | ORAL_TABLET | Freq: Two times a day (BID) | ORAL | Status: DC
  Filled 2021-05-13: qty 0.5

## 2021-05-13 MED ORDER — PENTOXIFYLLINE ER 400 MG PO TBCR
400.0000 mg | EXTENDED_RELEASE_TABLET | Freq: Two times a day (BID) | ORAL | Status: DC
  Administered 2021-05-13 – 2021-05-14 (×2): 400 mg via ORAL
  Filled 2021-05-13 (×3): qty 1

## 2021-05-13 MED ORDER — FLUTICASONE FUROATE-VILANTEROL 100-25 MCG/INH IN AEPB
1.0000 | INHALATION_SPRAY | Freq: Every day | RESPIRATORY_TRACT | Status: DC
  Administered 2021-05-14: 1 via RESPIRATORY_TRACT
  Filled 2021-05-13: qty 28

## 2021-05-13 MED ORDER — FOLIC ACID 1 MG PO TABS
1.0000 mg | ORAL_TABLET | Freq: Every day | ORAL | Status: DC
  Administered 2021-05-14: 1 mg via ORAL
  Filled 2021-05-13: qty 1

## 2021-05-13 MED ORDER — ONDANSETRON HCL 4 MG/2ML IJ SOLN
INTRAMUSCULAR | Status: DC | PRN
  Administered 2021-05-13: 4 mg via INTRAVENOUS

## 2021-05-13 MED ORDER — METHOTREXATE 2.5 MG PO TABS: 25.0000 mg | ORAL_TABLET | ORAL | Status: DC

## 2021-05-13 MED ORDER — FENTANYL CITRATE (PF) 100 MCG/2ML IJ SOLN
INTRAMUSCULAR | Status: DC | PRN
  Administered 2021-05-13: 50 ug via INTRAVENOUS

## 2021-05-13 MED ORDER — SALINE SPRAY 0.65 % NA SOLN
1.0000 | NASAL | Status: DC | PRN
  Filled 2021-05-13: qty 44

## 2021-05-13 MED ORDER — BACITRACIN ZINC 500 UNIT/GM EX OINT
TOPICAL_OINTMENT | CUTANEOUS | Status: AC
  Filled 2021-05-13: qty 28.35

## 2021-05-13 MED ORDER — LIDOCAINE-EPINEPHRINE 1 %-1:100000 IJ SOLN
INTRAMUSCULAR | Status: AC
  Filled 2021-05-13: qty 1

## 2021-05-13 MED ORDER — PREDNISONE 20 MG PO TABS
30.0000 mg | ORAL_TABLET | Freq: Every day | ORAL | Status: DC
  Administered 2021-05-14: 30 mg via ORAL
  Filled 2021-05-13: qty 2

## 2021-05-13 MED ORDER — FLUTICASONE-UMECLIDIN-VILANT 100-62.5-25 MCG/INH IN AEPB: 1.0000 | INHALATION_SPRAY | Freq: Every day | RESPIRATORY_TRACT | Status: DC

## 2021-05-13 MED ORDER — PROPRANOLOL HCL ER 80 MG PO CP24
80.0000 mg | ORAL_CAPSULE | Freq: Every day | ORAL | Status: DC
  Administered 2021-05-13: 80 mg via ORAL
  Filled 2021-05-13 (×2): qty 1

## 2021-05-13 MED ORDER — SPIRONOLACTONE 25 MG PO TABS: 25.0000 mg | ORAL_TABLET | Freq: Every day | ORAL | Status: DC

## 2021-05-13 MED ORDER — ROSUVASTATIN CALCIUM 10 MG PO TABS
10.0000 mg | ORAL_TABLET | Freq: Every day | ORAL | Status: DC
  Administered 2021-05-13: 10 mg via ORAL
  Filled 2021-05-13: qty 1

## 2021-05-13 MED ORDER — OXYMETAZOLINE HCL 0.05 % NA SOLN
1.0000 | Freq: Once | NASAL | Status: AC
  Administered 2021-05-13: 1 via NASAL
  Filled 2021-05-13: qty 30

## 2021-05-13 MED ORDER — ONDANSETRON HCL 4 MG/2ML IJ SOLN: 4.0000 mg | Freq: Three times a day (TID) | INTRAMUSCULAR | Status: DC | PRN

## 2021-05-13 MED ORDER — ACETAMINOPHEN 325 MG PO TABS: 650.0000 mg | ORAL_TABLET | Freq: Four times a day (QID) | ORAL | Status: DC | PRN

## 2021-05-13 MED ORDER — AMOXICILLIN 500 MG PO CAPS
500.0000 mg | ORAL_CAPSULE | Freq: Three times a day (TID) | ORAL | Status: DC
  Administered 2021-05-13 – 2021-05-14 (×3): 500 mg via ORAL
  Filled 2021-05-13 (×5): qty 1

## 2021-05-13 MED ORDER — OXYMETAZOLINE HCL 0.05 % NA SOLN
NASAL | Status: DC | PRN
  Administered 2021-05-13: 1 via TOPICAL

## 2021-05-13 MED ORDER — SODIUM CHLORIDE 0.9 % IV SOLN: INTRAVENOUS | Status: DC | PRN

## 2021-05-13 SURGICAL SUPPLY — 24 items
BASIN GRAD PLASTIC 32OZ STRL (MISCELLANEOUS) ×2 IMPLANT
CANISTER SUCT 1200ML W/VALVE (MISCELLANEOUS) ×2 IMPLANT
COAG SUCT 10F 3.5MM HAND CTRL (MISCELLANEOUS) ×2 IMPLANT
COVER WAND RF STERILE (DRAPES) ×2 IMPLANT
CUP MEDICINE 2OZ PLAST GRAD ST (MISCELLANEOUS) ×2 IMPLANT
DEFOGGER ANTIFOG KIT (MISCELLANEOUS) ×2 IMPLANT
DRESSING NASL FOAM PST OP SINU (MISCELLANEOUS) IMPLANT
DRSG NASAL FOAM POST OP SINU (MISCELLANEOUS)
ELECT REM PT RETURN 9FT ADLT (ELECTROSURGICAL) ×2
ELECTRODE REM PT RTRN 9FT ADLT (ELECTROSURGICAL) ×1 IMPLANT
GAUZE PACKING FOLDED 1IN STRL (GAUZE/BANDAGES/DRESSINGS) ×2 IMPLANT
GAUZE SPONGE 4X4 12PLY STRL (GAUZE/BANDAGES/DRESSINGS) ×2 IMPLANT
GLOVE SURG ENC MOIS LTX SZ7.5 (GLOVE) ×2 IMPLANT
GOWN STRL REUS W/ TWL LRG LVL3 (GOWN DISPOSABLE) ×2 IMPLANT
GOWN STRL REUS W/TWL LRG LVL3 (GOWN DISPOSABLE) ×2
KIT TURNOVER KIT A (KITS) ×2 IMPLANT
MANIFOLD NEPTUNE II (INSTRUMENTS) ×2 IMPLANT
NEEDLE HYPO 25GX1X1/2 BEV (NEEDLE) ×2 IMPLANT
NS IRRIG 500ML POUR BTL (IV SOLUTION) ×2 IMPLANT
SPONGE NEURO XRAY DETECT 1X3 (DISPOSABLE) ×2 IMPLANT
SYR 3ML LL SCALE MARK (SYRINGE) ×2 IMPLANT
SYR BULB IRRIG 60ML STRL (SYRINGE) ×2 IMPLANT
TAMPON NAS POPE NO STRING (MISCELLANEOUS) ×2 IMPLANT
TUBING CONNECTING 10 (TUBING) ×2 IMPLANT

## 2021-05-13 NOTE — Op Note (Signed)
..  05/13/2021  1:31 PM    Cassandra Brown  [id number]   Pre-Op Dx:  Nasal Hemorrhage, Afib with anti-coagulation  Post-op Dx: Same  Proc:   1)  Endoscopic Control of Nasal Hemorrhage- Complex  Surg:  [name]  Anes:  General  EBL:  20  Comp:  None  Indications:  Persistent left nasal hemorrhage resistant to medical management and packing x3 and TXA.  Recently started Plavix.  Findings: Left middle superior septal arterial bleeding just inferior to middle turbinate.  Extensive scarring and inflammation of nasal mucosa secondary to Wegener's  Procedure: After the patient was identified in holding and the benefits of the procedure were reviewed as well as the consent and risks.  The patient was taken to the operating room and with the patient in a comfortable supine position,  general orotracheal anesthesia was induced without difficulty.  A proper time-out was performed.  The patient next received preoperative Afrin spray for topical decongestion and vasoconstriction.  Several minutes were allowed for this to take effect.  The right side of the nasal cavity was next inspected with a zero degree endoscope and no bleeding site was identified.  At this time, attention was directed to her left nasal cavity.  The packing in placed was removed with curved clamp.  Using a zero degree endoscope and size 2 suction, blood and clot was removed from the patient's nasal cavity.  This demonstrated a large bleeding artery on the septum just beneath the middle turbinate.  This was cauterized with Bovie suction cautery.  Several other areas of bleeding were cauterized on the septum, anterior middle turbinate, and in the nasopharynx.    The sinus cavities were next copiously irrigated with sterile saline.  Meticulous hemostasis was noted and positive pressure from Anesthesia also revealed continued hemostasis with no evidence of any bleeding.  A 10cm Merocel sponge was placed into the  patient's nasal cavity and inflated with sterile saline.  Repeat visualization of the nasopharynx from the patient's right nasal cavity revealed good hemostasis and placement of Merocel sponge.  The patient's stomach was next suctioned with size 14 french OG catheter and bloody gastric contents removed.    Care of the patient at this time was transferred to [place].   Dispo:   PACU  Plan: Ice, elevation, narcotic analgesia and prophylactic antibiotics.  Consultation for admission by Hospitalists due to hypotension and comorbidities.  [name] 05/13/2021 1:31 PM

## 2021-05-13 NOTE — Consult Note (Signed)
.. Cassandra Brown, Cassandra Brown [id number] [birth year] Naaman Plummer, MD  Reason for Consult: epistaxis  HPI: 55 y.o. female with history of Wegener's disease and Afrib s/p watchmen and on Plavix presents with 1 day history of nose bleeds.  Patient reports that last night she awoke with significant nasal bleeding and help pressure overnight.  Today presented to ER and clamping, TXA, and nasal packing with balloon pack failed to control nose bleeding.  Patient has been having hematemsis as well.  Reports some mild nose bleeds in past but nothing like this.  Reports significant nausea.  Patient has been on Plavix for 1 week.  Allergies:  Allergies  Allergen Reactions  . Cyclobenzaprine Hives  . Ibuprofen Hives  . Morphine Itching and Swelling    Other reaction(s): itching/swelling   . Meperidine Itching and Swelling  . Sulfa Antibiotics Swelling  . Onion Itching    Can use onion powder    ROS: Review of systems normal other than 12 systems except per HPI.  PMH:  Past Medical History:  Diagnosis Date  . Allergic rhinitis due to pollen   . Angina pectoris ([place])   . CHF (congestive heart failure) ([place])   . Chronic maxillary sinusitis   . Chronic pain syndrome    BACK  . Chronic right sacroiliac joint pain   . Collagen vascular disease ([place])   . Coronary artery abnormality   . Dysrhythmia    AFIB,  FREQ PVC  . Gastroparesis   . GERD (gastroesophageal reflux disease)   . History of kidney stones   . Leukocytosis   . Obesity   . Patent foramen ovale   . Rheumatoid arthritis ([place])    OSTEOARTHRITIS  . Sleep apnea   . Spinal stenosis of lumbar region   . Unequal leg length   . Vasculitis ([place])   . Wegener's disease, pulmonary     FH: No family history on file.  SH:  Social History   Socioeconomic History  . Marital status: Single    Spouse name: Not on file  . Number of children: Not on file  . Years of education: Not on file  . Highest education level: Not on file   Occupational History  . Not on file  Tobacco Use  . Smoking status: Former Smoker    Packs/day: 3.00    Quit date: 02/06/2011    Years since quitting: 10.2  . Smokeless tobacco: Never Used  Vaping Use  . Vaping Use: Never used  Substance and Sexual Activity  . Alcohol use: Not Currently  . Drug use: Never  . Sexual activity: Not on file  Other Topics Concern  . Not on file  Social History Narrative  . Not on file   Social Determinants of Health   Financial Resource Strain: Not on file  Food Insecurity: Not on file  Transportation Needs: Not on file  Physical Activity: Not on file  Stress: Not on file  Social Connections: Not on file  Intimate Partner Violence: Not on file    PSH:  Past Surgical History:  Procedure Laterality Date  . APPENDECTOMY    . CHOLECYSTECTOMY    . JOINT REPLACEMENT Right    knee  . KNEE SURGERY Left   . PULSE GENERATOR IMPLANT Left 06/23/2020   Procedure: LEFT FLANK PULSE GENERATOR IMPLANT;  Surgeon: Deetta Perla, MD;  Location: [facility] ORS;  Service: Neurosurgery;  Laterality: Left;  . SHOULDER SURGERY Bilateral   . THORACIC LAMINECTOMY FOR SPINAL CORD STIMULATOR Bilateral 06/23/2020  Procedure: THORACIC SPINAL CORD STIMULATOR;  Surgeon: Deetta Perla, MD;  Location: [facility] OR[facility];  Service: Neurosurgery;  Laterality: Bilateral;  . VIDEO BRONCHOSCOPY WITH ENDOBRONCHIAL NAVIGATION N/A 02/27/2021   Procedure: VIDEO BRONCHOSCOPY WITH ENDOBRONCHIAL NAVIGATION;  Surgeon: Ottie Glazier, MD;  Location: [facility] ORS;  Service: Thoracic;  Laterality: N/A;  . VIDEO BRONCHOSCOPY WITH ENDOBRONCHIAL ULTRASOUND N/A 02/27/2021   Procedure: VIDEO BRONCHOSCOPY WITH ENDOBRONCHIAL ULTRASOUND;  Surgeon: Ottie Glazier, MD;  Location: [facility] ORS;  Service: Thoracic;  Laterality: N/A;    Physical  Exam:  GEN-  Supine in bed, active emesis and left sided nasal bleeding NEURO- CN 2-12 grossly intact and symmetric. EARS- external ears clear NOSE-  Packing in place on left side  with active robust nose bleed from left. OC/OP-  Dried blood and blood pooling down posterior oropharynx NECK-  No LAD, no thyromegaly RESP- unlabored   Procedure:  Control of nasal hemorrhage level 2.   After verbal consent was obtained, the patient's rhinorocket was removed from her left nasal cavity.  This revealed robust bleeding from superior middle septum.  A small merocel sponge was fashioned and placed overlying this area and a larger 10cm Merocel sponge was placed into the patient's nasal cavity keeping pressure on this area.  This improved nasal hemorrhage with nasal clamp.  Visualization of the posterior oropharynx revealed significant improvement of bleeding down the oropharynx.  The sponge was inflated with Afrin.   A/P: Epistaxis with Wegener's s/p left A/P packing for arterial bleeding of mid-septum.  Plan:  Will see how pressure with robust packing goes as it significantly decreased bleeding at least for now.  If she continues to bleed around packing, will need to proceed to OR for endoscopic control of nasal hemorrhage.   [name] 05/13/2021 12:02 PM

## 2021-05-13 NOTE — H&P (Signed)
History and Physical    Cassandra Brown ZHG:[id number] DOB: 1966-05-19 DOA: 05/13/2021  Referring MD/NP/PA:   PCP: Baxter Hire, MD   Patient coming from:  The patient is coming from home.  At baseline, pt is independent for most of ADL.        Chief Complaint: nose bleeding  HPI: Cassandra Brown is a 55 y.o. female with medical history significant of Wegener's disease-pulm, vasculitis, rheumatoid arthritis, OSA on CPAP, CAD, kidney stone, dCHF, atrial fibrillation (s/p of watchmen, recently switched from Eliquis to Plavix), asthma, who presents with nosebleeding.  Pt states that her nose bleeding started at about 2 AM. She has nausea and spit up blood when she woke up. She states that she was on Eliquis for A fib which was changed to Plavix 1 week ago. Per report, her nose bleeding was significantly in ED.  Patient was treated with clamping, TXA, and nasal packing with balloon pack in ED, but nose bleeding was not controlled. Dr. Pryor Ochoa of ENT was consulted. He did endoscopic Control of Nasal Hemorrhage- Complex. When I saw pt in PACU, her nose bleeding has stopped.  Patient denies any chest pain, cough, shortness breath.  No fever or chills.  Patient has nausea, currently no vomiting, abdominal pain or diarrhea.  No symptoms of UTI.  ED Course: pt was found to have hemoglobin 11.2 (13.8 on 02/27/2021), pending COVID-19 PCR, troponin level 8, 6, INR 1.0, GFR> 60, temperature 96.9, soft blood pressure 93/55, 103/70, heart rate 77, RR 20, oxygen saturation 98% on room air.  Patient is placed on MedSurg bed for observation.  Review of Systems:   General: no fevers, chills, no body weight gain, has fatigue HEENT: no blurry vision, hearing changes or sore throat. Has nose bleeding Respiratory: no dyspnea, coughing, wheezing CV: no chest pain, no palpitations GI: has nausea, vomiting, no abdominal pain, diarrhea, constipation GU: no dysuria, burning on urination, increased urinary  frequency, hematuria  Ext: has leg edema Neuro: no unilateral weakness, numbness, or tingling, no vision change or hearing loss Skin: no rash, no skin tear. MSK: No muscle spasm, has hand finger deformity, no limitation of range of movement in spin Heme: No easy bruising.  Travel history: No recent long distant travel.  Allergy:  Allergies  Allergen Reactions  . Cyclobenzaprine Hives  . Ibuprofen Hives  . Morphine Itching and Swelling    Other reaction(s): itching/swelling   . Meperidine Itching and Swelling  . Sulfa Antibiotics Swelling  . Onion Itching    Can use onion powder    Past Medical History:  Diagnosis Date  . Allergic rhinitis due to pollen   . Angina pectoris ([place])   . CHF (congestive heart failure) ([place])   . Chronic maxillary sinusitis   . Chronic pain syndrome    BACK  . Chronic right sacroiliac joint pain   . Collagen vascular disease ([place])   . Coronary artery abnormality   . Dysrhythmia    AFIB,  FREQ PVC  . Gastroparesis   . GERD (gastroesophageal reflux disease)   . History of kidney stones   . Leukocytosis   . Obesity   . Patent foramen ovale   . Rheumatoid arthritis ([place])    OSTEOARTHRITIS  . Sleep apnea   . Spinal stenosis of lumbar region   . Unequal leg length   . Vasculitis ([place])   . Wegener's disease, pulmonary     Past Surgical History:  Procedure Laterality Date  . APPENDECTOMY    .  CHOLECYSTECTOMY    . JOINT REPLACEMENT Right    knee  . KNEE SURGERY Left   . PULSE GENERATOR IMPLANT Left 06/23/2020   Procedure: LEFT FLANK PULSE GENERATOR IMPLANT;  Surgeon: Deetta Perla, MD;  Location: [facility] ORS;  Service: Neurosurgery;  Laterality: Left;  . SHOULDER SURGERY Bilateral   . THORACIC LAMINECTOMY FOR SPINAL CORD STIMULATOR Bilateral 06/23/2020   Procedure: THORACIC SPINAL CORD STIMULATOR;  Surgeon: Deetta Perla, MD;  Location: [facility] ORS;  Service: Neurosurgery;  Laterality: Bilateral;  . VIDEO BRONCHOSCOPY WITH ENDOBRONCHIAL NAVIGATION  N/A 02/27/2021   Procedure: VIDEO BRONCHOSCOPY WITH ENDOBRONCHIAL NAVIGATION;  Surgeon: Ottie Glazier, MD;  Location: [facility] ORS;  Service: Thoracic;  Laterality: N/A;  . VIDEO BRONCHOSCOPY WITH ENDOBRONCHIAL ULTRASOUND N/A 02/27/2021   Procedure: VIDEO BRONCHOSCOPY WITH ENDOBRONCHIAL ULTRASOUND;  Surgeon: Ottie Glazier, MD;  Location: [facility] ORS;  Service: Thoracic;  Laterality: N/A;    Social History:  reports that she quit smoking about 10 years ago. She smoked 3.00 packs per day. She has never used smokeless tobacco. She reports previous alcohol use. She reports that she does not use drugs.  Family History: pt is adopted.  Prior to Admission medications   Medication Sig Start Date End Date Taking? Authorizing Provider  aspirin EC 81 MG tablet Take 81 mg by mouth daily. Swallow whole.   Yes [provider]  BAC 50-325-40 MG tablet Take 1 tablet by mouth every 4 (four) hours as needed for headache. 02/10/21  Yes [provider]  carisoprodol (SOMA) 350 MG tablet Take 350 mg by mouth 3 (three) times daily.   Yes [provider]  cholecalciferol (VITAMIN D3) 25 MCG (1000 UNIT) tablet Take 1,000 Units by mouth daily.   Yes [provider]  clobetasol cream (TEMOVATE) 6.23 % Apply 1 application topically 2 (two) times daily as needed (rash).   Yes [provider]  clopidogrel (PLAVIX) 75 MG tablet Take 75 mg by mouth daily. 05/05/21  Yes [provider]  DUPIXENT 300 MG/2ML SOPN Inject 300 mg into the skin every 14 (fourteen) days. 05/04/21  Yes [provider]  EMGALITY 120 MG/ML SOAJ Inject 120 mg into the skin every 28 (twenty-eight) days. 02/05/21  Yes [provider]  folic acid (FOLVITE) 1 MG tablet Take 1 mg by mouth daily.   Yes [provider]  furosemide (LASIX) 20 MG tablet Take 20 mg by mouth daily. 12/30/20  Yes [provider]  gentamicin ointment (GARAMYCIN) 0.1 % Apply 1 application topically daily as  needed (rash). 09/06/19  Yes [provider]  HYDROcodone-acetaminophen (NORCO) 10-325 MG tablet Take 1 tablet by mouth every 6 (six) hours.   Yes [provider]  methotrexate (RHEUMATREX) 2.5 MG tablet Take 25 mg by mouth every Friday. Caution:Chemotherapy. Protect from light.   Yes [provider]  montelukast (SINGULAIR) 10 MG tablet Take 10 mg by mouth at bedtime.   Yes [provider]  NURTEC 75 MG TBDP Take 75 mg by mouth daily as needed for migraine. 01/15/21  Yes [provider]  pantoprazole (PROTONIX) 40 MG tablet Take 40 mg by mouth daily.   Yes [provider]  pentoxifylline (TRENTAL) 400 MG CR tablet Take 400 mg by mouth in the morning and at bedtime.   Yes [provider]  pimecrolimus (ELIDEL) 1 % cream Apply 1 application topically 2 (two) times daily as needed (rash).   Yes [provider]  predniSONE (DELTASONE) 5 MG tablet Take 5 mg by mouth  daily with breakfast.   Yes [provider]  pregabalin (LYRICA) 50 MG capsule Take 50 mg by mouth 2 (two) times daily.    Yes [provider]  promethazine (PHENERGAN) 25 MG tablet Take 25 mg by mouth every 6 (six) hours as needed for nausea or vomiting.    Yes [provider]  propranolol ER (INDERAL LA) 80 MG 24 hr capsule Take 80 mg by mouth at bedtime. 02/09/21  Yes [provider]  rosuvastatin (CRESTOR) 10 MG tablet Take 10 mg by mouth at bedtime. 02/18/21  Yes [provider]  sodium chloride (OCEAN) 0.65 % SOLN nasal spray Place 1 spray into both nostrils as needed for congestion.   Yes [provider]  sotalol (BETAPACE) 80 MG tablet Take 40 mg by mouth 2 (two) times daily. 05/29/20  Yes [provider]  Specialty Vitamins Products (CENTRUM PERFORMANCE) TABS Take 1 tablet by mouth daily.   Yes [provider]  sucralfate (CARAFATE) 1 g tablet Take 1 g by mouth 2 (two) times daily before a meal.  02/09/21  Yes [provider]  TRELEGY ELLIPTA 100-62.5-25 MCG/INH AEPB Inhale 1 puff into the lungs daily. 01/05/21  Yes [provider]  triamcinolone cream (KENALOG) 0.1 % Apply 1 application topically daily as needed (itching).  01/08/20  Yes [provider]  verapamil (CALAN) 40 MG tablet 40 mg 3 (three) times daily. 05/04/21  Yes [provider]  bumetanide (BUMEX) 2 MG tablet Take 2 mg by mouth daily. Patient not taking: No sig reported    [provider]  ELIQUIS 5 MG TABS tablet Take 5 mg by mouth 2 (two) times daily. Patient not taking: No sig reported 05/04/21   [provider]  Golimumab (SIMPONI) 50 MG/0.5ML SOAJ Inject 50 mg into the skin every 30 (thirty) days. Patient not taking: No sig reported    [provider]  methotrexate 50 MG/2ML injection Inject 25 mg into the vein once a week. Patient not taking: No sig reported    [provider]  metolazone (ZAROXOLYN) 5 MG tablet Take 5 mg by mouth daily as needed (fluid). Patient not taking: No sig reported 09/17/18   [provider]  naloxone [facility]) nasal spray 4 mg/0.1 mL For opioid overdose or excessive sleepiness after taking narcotics Patient not taking: No sig reported 06/23/20   Lonell Face, NP  spironolactone (ALDACTONE) 25 MG tablet Take 25 mg by mouth daily. Patient not taking: No sig reported    [provider]    Physical Exam: Vitals:   05/13/21 1430 05/13/21 1440 05/13/21 1456 05/13/21 1518  BP: (!) 114/56   111/65  Pulse: 73 76 76 77  Resp: 19 19 14 18   Temp:   (!) 97.1 F (36.2 C) 98.6 F (37 C)  TempSrc:    Oral  SpO2: 99% 99% 99% 100%  Weight:      Height:       General: Not in acute distress HEENT:       Eyes: PERRL, EOMI, no scleral icterus.       ENT: No discharge from the ears. No pharynx injection, no tonsillar enlargement. No active nose bleeding currently.       Neck: No JVD, no bruit, no mass felt. Heme:  No neck lymph node enlargement. Cardiac: S1/S2, RRR, No murmurs, No gallops or rubs. Respiratory: No rales, wheezing, rhonchi or rubs. GI: Soft, nondistended, nontender, no rebound pain, no organomegaly, BS present. GU: No hematuria  Ext: has trace leg edema bilaterally. 1+DP/PT pulse bilaterally. Musculoskeletal: has hand finger deformity, No joint redness or warmth, no limitation of ROM in spin. Skin: No rashes.  Neuro: Alert, oriented X3, cranial nerves II-XII grossly intact, moves all extremities normally. Psych: Patient is not psychotic, no suicidal or hemocidal ideation.  Labs on Admission: I have personally reviewed following labs and imaging studies  CBC: Recent Labs  Lab 05/13/21 0851 05/13/21 1520  WBC 16.8* 15.0*  HGB 11.2* 9.3*  HCT 35.9* 30.5*  MCV 86.3 89.2  PLT 369 [id number]   Basic Metabolic Panel: Recent Labs  Lab 05/13/21 0851  NA 141  K 3.7  CL 103  CO2 27  GLUCOSE 137*  BUN 29*  CREATININE 1.05*  CALCIUM 9.3   GFR: Estimated Creatinine Clearance: 61.8 mL/min (A) (by C-G formula based on SCr of 1.05 mg/dL (H)). Liver Function Tests: No results for input(s): AST, ALT, ALKPHOS, BILITOT, PROT, ALBUMIN in the last 168 hours. No results for input(s): LIPASE, AMYLASE in the last 168 hours. No results for input(s): AMMONIA in the last 168 hours. Coagulation Profile: Recent Labs  Lab 05/13/21 0851  INR 1.0   Cardiac Enzymes: No results for input(s): CKTOTAL, CKMB, CKMBINDEX, TROPONINI in the last 168 hours. BNP (last 3 results) No results for input(s): PROBNP in the last 8760 hours. HbA1C: No results for input(s): HGBA1C in the last 72 hours. CBG: No results for input(s): GLUCAP in the last 168 hours. Lipid Profile: No results for input(s): CHOL, HDL, LDLCALC, TRIG, CHOLHDL, LDLDIRECT in the last 72 hours. Thyroid Function Tests: No results for input(s): TSH, T4TOTAL, FREET4, T3FREE, THYROIDAB in the last 72 hours. Anemia Panel: No results for  input(s): VITAMINB12, FOLATE, FERRITIN, TIBC, IRON, RETICCTPCT in the last 72 hours. Urine analysis:    Component Value Date/Time   COLORURINE AMBER (A) 06/19/2020 1219   APPEARANCEUR HAZY (A) 06/19/2020 1219   LABSPEC 1.031 (H) 06/19/2020 1219   PHURINE 5.0 06/19/2020 1219   GLUCOSEU NEGATIVE 06/19/2020 1219   HGBUR NEGATIVE 06/19/2020 1219   [place] 06/19/2020 [place] 06/19/2020 1219   PROTEINUR NEGATIVE 06/19/2020 1219   NITRITE NEGATIVE 06/19/2020 1219   LEUKOCYTESUR SMALL (A) 06/19/2020 1219   Sepsis Labs: @LABRCNTIP (procalcitonin:4,lacticidven:4) )No results found for this or any previous visit (from the past 240 hour(s)).   Radiological Exams on Admission: No results found.   EKG: I have personally reviewed.  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  Hemoglobin stable.  -Placed on MedSurg bed for position -Follow-up CBC every 8 hour -hold ASA, plavix -Amoxicillin 500 mg every 8 hours per Dr. 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  No fever.  Possibly reactive- -patient is on amoxicillin -Follow-up by CBC  Chronic diastolic CHF (congestive heart failure)  (  [place]): 2D echo on 10/09/2020 showed EF of 50%.  Patient does not have respiratory distress.  CHF seem to be compensated - Check BNP -Continue Lasix 20 mg daily and spironolactone      DVT ppx: SCD Code Status: Full code Family Communication: not done, no family member is at bed side.    Disposition Plan:  Anticipate discharge back to previous environment Consults called:  Dr. Pryor Ochoa of ENT Admission status and Level of care: Med-Surg:  for obs    Status is: Observation  The patient remains OBS appropriate and will d/c before 2 midnights.  Dispo: The patient is from: Home              Anticipated d/c is to: Home              Patient currently is not medically stable to d/c.   Difficult to place patient No          Date of Service 05/13/2021    Ivor Costa [facility]   If 7PM-7AM, please contact night-coverage [email or web address] 05/13/2021, 4:40 PM

## 2021-05-13 NOTE — Transfer of Care (Signed)
Immediate Anesthesia Transfer of Care Note  Patient: Cassandra Brown  Procedure(s) Performed: EPISTAXIS CONTROL (N/A )  Patient Location: PACU  Anesthesia Type:General  Level of Consciousness: sedated and responds to stimulation  Airway & Oxygen Therapy: Patient Spontanous Breathing and Patient connected to face mask oxygen  Post-op Assessment: Report given to RN and Post -op Vital signs reviewed and stable  Post vital signs: Reviewed and stable  Last Vitals:  Vitals Value Taken Time  BP    Temp    Pulse 80 05/13/21 1345  Resp 14 05/13/21 1347  SpO2 100 % 05/13/21 1345  Vitals shown include unvalidated device data.  Last Pain:  Vitals:   05/13/21 1255  TempSrc: Oral  PainSc: 4          Complications: No complications documented.

## 2021-05-13 NOTE — ED Notes (Addendum)
Surgical consent given to ENT .  /  Sign by ENT / pt / and this Nurse

## 2021-05-13 NOTE — ED Notes (Addendum)
Specialist (ENT) at bedside

## 2021-05-13 NOTE — Anesthesia Preprocedure Evaluation (Signed)
Anesthesia Evaluation  Patient identified by MRN, date of birth, ID band Patient awake    Reviewed: Allergy & Precautions, H&P , NPO status , Patient's Chart, lab work & pertinent test results, reviewed documented beta blocker date and time   Airway Mallampati: II  TM Distance: >3 FB Neck ROM: full    Dental  (+) Teeth Intact   Pulmonary asthma , sleep apnea , former smoker,    Pulmonary exam normal        Cardiovascular Exercise Tolerance: Poor + angina with exertion + CAD and +CHF  + dysrhythmias Atrial Fibrillation  Rhythm:regular Rate:Normal     Neuro/Psych  Neuromuscular disease negative psych ROS   GI/Hepatic Neg liver ROS, GERD  Medicated,  Endo/Other  negative endocrine ROS  Renal/GU negative Renal ROS  negative genitourinary   Musculoskeletal   Abdominal   Peds  Hematology negative hematology ROS (+)   Anesthesia Other Findings Past Medical History: No date: Allergic rhinitis due to pollen No date: Angina pectoris (HCC) No date: CHF (congestive heart failure) (HCC) No date: Chronic maxillary sinusitis No date: Chronic pain syndrome     Comment:  BACK No date: Chronic right sacroiliac joint pain No date: Collagen vascular disease (HCC) No date: Coronary artery abnormality No date: Dysrhythmia     Comment:  AFIB,  FREQ PVC No date: Gastroparesis No date: GERD (gastroesophageal reflux disease) No date: History of kidney stones No date: Leukocytosis No date: Obesity No date: Patent foramen ovale No date: Rheumatoid arthritis (HCC)     Comment:  OSTEOARTHRITIS No date: Sleep apnea No date: Spinal stenosis of lumbar region No date: Unequal leg length No date: Vasculitis ([place]) No date: Wegener's disease, pulmonary Past Surgical History: No date: APPENDECTOMY No date: CHOLECYSTECTOMY No date: JOINT REPLACEMENT; Right     Comment:  knee No date: KNEE SURGERY; Left 06/23/2020: PULSE GENERATOR  IMPLANT; Left     Comment:  Procedure: LEFT FLANK PULSE GENERATOR IMPLANT;  Surgeon:              Deetta Perla, MD;  Location: [facility] ORS;  Service:               Neurosurgery;  Laterality: Left; No date: SHOULDER SURGERY; Bilateral 06/23/2020: THORACIC LAMINECTOMY FOR SPINAL CORD STIMULATOR; Bilateral     Comment:  Procedure: THORACIC SPINAL CORD STIMULATOR;  Surgeon:               Deetta Perla, MD;  Location: [facility] ORS;  Service:               Neurosurgery;  Laterality: Bilateral; 02/27/2021: VIDEO BRONCHOSCOPY WITH ENDOBRONCHIAL NAVIGATION; N/A     Comment:  Procedure: VIDEO BRONCHOSCOPY WITH ENDOBRONCHIAL               NAVIGATION;  Surgeon: Ottie Glazier, MD;  Location:               [facility] ORS;  Service: Thoracic;  Laterality: N/A; 02/27/2021: VIDEO BRONCHOSCOPY WITH ENDOBRONCHIAL ULTRASOUND; N/A     Comment:  Procedure: VIDEO BRONCHOSCOPY WITH ENDOBRONCHIAL               ULTRASOUND;  Surgeon: Ottie Glazier, MD;  Location:               [facility] ORS;  Service: Thoracic;  Laterality: N/A;   Reproductive/Obstetrics negative OB ROS  Anesthesia Physical Anesthesia Plan  ASA: III and emergent  Anesthesia Plan: General ETT   Post-op Pain Management:    Induction:   PONV Risk Score and Plan:   Airway Management Planned:   Additional Equipment:   Intra-op Plan:   Post-operative Plan:   Informed Consent: I have reviewed the patients History and Physical, chart, labs and discussed the procedure including the risks, benefits and alternatives for the proposed anesthesia with the patient or authorized representative who has indicated his/her understanding and acceptance.     Dental Advisory Given  Plan Discussed with: CRNA  Anesthesia Plan Comments:         Anesthesia Quick Evaluation

## 2021-05-13 NOTE — ED Triage Notes (Addendum)
Pt comes with c/o nose bleed since about 2am this morning. Pt states she was just changed from Eliquis to Plavix last week. Pt states she feels sick on her stomach. Pt states it just keeps running blood. Pt has tissue in place. Bleeding controlled at this time.

## 2021-05-13 NOTE — Anesthesia Procedure Notes (Signed)
Procedure Name: Intubation Date/Time: 05/13/2021 1:06 PM Performed by: Jonna Clark, CRNA Pre-anesthesia Checklist: Patient identified, Patient being monitored, Timeout performed, Emergency Drugs available and Suction available Patient Re-evaluated:Patient Re-evaluated prior to induction Oxygen Delivery Method: Circle system utilized Preoxygenation: Pre-oxygenation with 100% oxygen Induction Type: IV induction, Cricoid Pressure applied and Rapid sequence Ventilation: Mask ventilation without difficulty Laryngoscope Size: Mac and 3 Grade View: Grade I Tube type: Oral Rae Tube size: 7.0 mm Number of attempts: 1 Airway Equipment and Method: Stylet Placement Confirmation: ETT inserted through vocal cords under direct vision,  positive ETCO2 and breath sounds checked- equal and bilateral Secured at: 21 cm Tube secured with: Tape Dental Injury: Teeth and Oropharynx as per pre-operative assessment

## 2021-05-13 NOTE — Progress Notes (Signed)
.. 05/13/2021 5:29 PM  Cassandra Brown [id number]  Post-Op Day 0    Temp:  [96.9 F (36.1 C)-98.6 F (37 C)] 98.6 F (37 C) (05/18 1518) Pulse Rate:  [67-86] 77 (05/18 1518) Resp:  [14-20] 18 (05/18 1518) BP: (93-120)/(55-92) 111/65 (05/18 1518) SpO2:  [98 %-100 %] 100 % (05/18 1518) Weight:  [95.3 kg] 95.3 kg (05/18 1255),     Intake/Output Summary (Last 24 hours) at 05/13/2021 1729 Last data filed at 05/13/2021 1347 Gross per 24 hour  Intake 700 ml  Output --  Net 700 ml    Results for orders placed or performed during the hospital encounter of 05/13/21 (from the past 24 hour(s))  Basic metabolic panel     Status: Abnormal   Collection Time: 05/13/21  8:51 AM  Result Value Ref Range   Sodium 141 135 - 145 mmol/L   Potassium 3.7 3.5 - 5.1 mmol/L   Chloride 103 98 - 111 mmol/L   CO2 27 22 - 32 mmol/L   Glucose, Bld 137 (H) 70 - 99 mg/dL   BUN 29 (H) 6 - 20 mg/dL   Creatinine, Ser 1.05 (H) 0.44 - 1.00 mg/dL   Calcium 9.3 8.9 - 10.3 mg/dL   GFR, Estimated >60 >60 mL/min   Anion gap 11 5 - 15  CBC     Status: Abnormal   Collection Time: 05/13/21  8:51 AM  Result Value Ref Range   WBC 16.8 (H) 4.0 - 10.5 K/uL   RBC 4.16 3.87 - 5.11 MIL/uL   Hemoglobin 11.2 (L) 12.0 - 15.0 g/dL   HCT 35.9 (L) 36.0 - 46.0 %   MCV 86.3 80.0 - 100.0 fL   MCH 26.9 26.0 - 34.0 pg   MCHC 31.2 30.0 - 36.0 g/dL   RDW 18.8 (H) 11.5 - 15.5 %   Platelets 369 150 - 400 K/uL   nRBC 0.0 0.0 - 0.2 %  Troponin I (High Sensitivity)     Status: None   Collection Time: 05/13/21  8:51 AM  Result Value Ref Range   Troponin I (High Sensitivity) 8 <18 ng/L  Protime-INR (order if Patient is taking Coumadin / Warfarin)     Status: None   Collection Time: 05/13/21  8:51 AM  Result Value Ref Range   Prothrombin Time 13.5 11.4 - 15.2 seconds   INR 1.0 0.8 - 1.2  Brain natriuretic peptide     Status: None   Collection Time: 05/13/21  9:00 AM  Result Value Ref Range   B Natriuretic Peptide 29.8 0.0 -  100.0 pg/mL  Troponin I (High Sensitivity)     Status: None   Collection Time: 05/13/21 11:46 AM  Result Value Ref Range   Troponin I (High Sensitivity) 6 <18 ng/L  Type and screen [facility]     Status: None   Collection Time: 05/13/21 11:50 AM  Result Value Ref Range   ABO/RH(D) O POS    Antibody Screen NEG    Sample Expiration      05/16/2021,2359 Performed at [facility], [address] [place], [place] [address]   CBC     Status: Abnormal   Collection Time: 05/13/21  3:20 PM  Result Value Ref Range   WBC 15.0 (H) 4.0 - 10.5 K/uL   RBC 3.42 (L) 3.87 - 5.11 MIL/uL   Hemoglobin 9.3 (L) 12.0 - 15.0 g/dL   HCT 30.5 (L) 36.0 - 46.0 %   MCV 89.2 80.0 - 100.0 fL  MCH 27.2 26.0 - 34.0 pg   MCHC 30.5 30.0 - 36.0 g/dL   RDW 18.6 (H) 11.5 - 15.5 %   Platelets 259 150 - 400 K/uL   nRBC 0.0 0.0 - 0.2 %  APTT     Status: Abnormal   Collection Time: 05/13/21  3:20 PM  Result Value Ref Range   aPTT 23 (L) 24 - 36 seconds    SUBJECTIVE:  Doing well.  Reports being tired.  Tolerating PO.  Improved nausea and pain.  Ambulating.  OBJECTIVE:  GEN- NAD NOSE-  Packing in place on left and clear, no active bleeding noted OC/OP-  No bleeding down posterior oropharynx.  Throat clear  IMPRESSION:  Epistaxis and history of Wegener's s/p endoscopic cautery  PLAN:  Observe overnight to ensure resolution.  Continue prophylactic antibiotic.  Appreciate Hospitalist assistance with a complicated patient.  Anticipate cleared for discharge tomorrow from an ENT perspective.  Cassandra Brown 05/13/2021, 5:29 PM

## 2021-05-13 NOTE — ED Notes (Signed)
Report given to specials , Pt calm , collective , transported via [place]

## 2021-05-14 ENCOUNTER — Encounter: Payer: Self-pay | Admitting: Otolaryngology

## 2021-05-14 DIAGNOSIS — I482 Chronic atrial fibrillation, unspecified: Secondary | ICD-10-CM | POA: Diagnosis not present

## 2021-05-14 DIAGNOSIS — M313 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: Secondary | ICD-10-CM | POA: Diagnosis not present

## 2021-05-14 DIAGNOSIS — R040 Epistaxis: Secondary | ICD-10-CM | POA: Diagnosis not present

## 2021-05-14 LAB — CBC
HCT: 25.3 % — ABNORMAL LOW (ref 36.0–46.0)
Hemoglobin: 8 g/dL — ABNORMAL LOW (ref 12.0–15.0)
MCH: 27.1 pg (ref 26.0–34.0)
MCHC: 31.6 g/dL (ref 30.0–36.0)
MCV: 85.8 fL (ref 80.0–100.0)
Platelets: 254 10*3/uL (ref 150–400)
RBC: 2.95 MIL/uL — ABNORMAL LOW (ref 3.87–5.11)
RDW: 18.6 % — ABNORMAL HIGH (ref 11.5–15.5)
WBC: 14.7 10*3/uL — ABNORMAL HIGH (ref 4.0–10.5)
nRBC: 0 % (ref 0.0–0.2)

## 2021-05-14 LAB — BASIC METABOLIC PANEL
Anion gap: 7 (ref 5–15)
BUN: 23 mg/dL — ABNORMAL HIGH (ref 6–20)
CO2: 28 mmol/L (ref 22–32)
Calcium: 9.1 mg/dL (ref 8.9–10.3)
Chloride: 108 mmol/L (ref 98–111)
Creatinine, Ser: 0.77 mg/dL (ref 0.44–1.00)
GFR, Estimated: >60 mL/min (ref >60)
Glucose, Bld: 118 mg/dL — ABNORMAL HIGH (ref 70–99)
Potassium: 4.7 mmol/L (ref 3.5–5.1)
Sodium: 143 mmol/L (ref 135–145)

## 2021-05-14 LAB — HIV ANTIBODY (ROUTINE TESTING W REFLEX): HIV Screen 4th Generation wRfx: NONREACTIVE

## 2021-05-14 LAB — MAGNESIUM: Magnesium: 2.1 mg/dL (ref 1.7–2.4)

## 2021-05-14 MED ORDER — AMOXICILLIN 500 MG PO CAPS: 500.0000 mg | ORAL_CAPSULE | Freq: Three times a day (TID) | ORAL | 0 refills | Status: AC

## 2021-05-14 NOTE — Plan of Care (Signed)
  Problem: Education: Goal: Knowledge of General Education information will improve Description: Including pain rating scale, medication(s)/side effects and non-pharmacologic comfort measures Outcome: Progressing   Problem: Health Behavior/Discharge Planning: Goal: Ability to manage health-related needs will improve Outcome: Progressing   Problem: Clinical Measurements: Goal: Ability to maintain clinical measurements within normal limits will improve Outcome: Progressing Goal: Will remain free from infection Outcome: Progressing Goal: Diagnostic test results will improve Outcome: Not Applicable Goal: Respiratory complications will improve Outcome: Not Applicable Goal: Cardiovascular complication will be avoided Outcome: Progressing   Problem: Activity: Goal: Risk for activity intolerance will decrease Outcome: Progressing   Problem: Nutrition: Goal: Adequate nutrition will be maintained Outcome: Progressing   Problem: Coping: Goal: Level of anxiety will decrease Outcome: Progressing   Problem: Elimination: Goal: Will not experience complications related to bowel motility Outcome: Progressing Goal: Will not experience complications related to urinary retention Outcome: Progressing   Problem: Pain Managment: Goal: General experience of comfort will improve Outcome: Progressing

## 2021-05-14 NOTE — Plan of Care (Signed)
  Problem: Education: Goal: Knowledge of General Education information will improve Description: Including pain rating scale, medication(s)/side effects and non-pharmacologic comfort measures Outcome: Adequate for Discharge   Problem: Health Behavior/Discharge Planning: Goal: Ability to manage health-related needs will improve Outcome: Adequate for Discharge   Problem: Clinical Measurements: Goal: Ability to maintain clinical measurements within normal limits will improve Outcome: Adequate for Discharge Goal: Will remain free from infection Outcome: Adequate for Discharge Goal: Cardiovascular complication will be avoided Outcome: Adequate for Discharge   Problem: Activity: Goal: Risk for activity intolerance will decrease Outcome: Adequate for Discharge   Problem: Nutrition: Goal: Adequate nutrition will be maintained Outcome: Adequate for Discharge   Problem: Coping: Goal: Level of anxiety will decrease Outcome: Adequate for Discharge   Problem: Elimination: Goal: Will not experience complications related to bowel motility Outcome: Adequate for Discharge Goal: Will not experience complications related to urinary retention Outcome: Adequate for Discharge   Problem: Pain Managment: Goal: General experience of comfort will improve Outcome: Adequate for Discharge   Problem: Safety: Goal: Ability to remain free from injury will improve Outcome: Adequate for Discharge   Problem: Skin Integrity: Goal: Risk for impaired skin integrity will decrease Outcome: Adequate for Discharge   

## 2021-05-14 NOTE — Progress Notes (Signed)
.. 05/14/2021 8:16 AM  Cassandra Brown [id number]  Post-Op Day 1    Temp:  [96.9 F (36.1 C)-99 F (37.2 C)] 98.6 F (37 C) (05/19 0806) Pulse Rate:  [67-97] 79 (05/19 0806) Resp:  [14-20] 18 (05/19 0806) BP: (93-129)/(55-79) 109/64 (05/19 0806) SpO2:  [97 %-100 %] 99 % (05/19 0806) Weight:  [95.3 kg] 95.3 kg (05/18 1255),     Intake/Output Summary (Last 24 hours) at 05/14/2021 0816 Last data filed at 05/14/2021 0607 Gross per 24 hour  Intake 820 ml  Output 0 ml  Net 820 ml    Results for orders placed or performed during the hospital encounter of 05/13/21 (from the past 24 hour(s))  Basic metabolic panel     Status: Abnormal   Collection Time: 05/13/21  8:51 AM  Result Value Ref Range   Sodium 141 135 - 145 mmol/L   Potassium 3.7 3.5 - 5.1 mmol/L   Chloride 103 98 - 111 mmol/L   CO2 27 22 - 32 mmol/L   Glucose, Bld 137 (H) 70 - 99 mg/dL   BUN 29 (H) 6 - 20 mg/dL   Creatinine, Ser 1.05 (H) 0.44 - 1.00 mg/dL   Calcium 9.3 8.9 - 10.3 mg/dL   GFR, Estimated >60 >60 mL/min   Anion gap 11 5 - 15  CBC     Status: Abnormal   Collection Time: 05/13/21  8:51 AM  Result Value Ref Range   WBC 16.8 (H) 4.0 - 10.5 K/uL   RBC 4.16 3.87 - 5.11 MIL/uL   Hemoglobin 11.2 (L) 12.0 - 15.0 g/dL   HCT 35.9 (L) 36.0 - 46.0 %   MCV 86.3 80.0 - 100.0 fL   MCH 26.9 26.0 - 34.0 pg   MCHC 31.2 30.0 - 36.0 g/dL   RDW 18.8 (H) 11.5 - 15.5 %   Platelets 369 150 - 400 K/uL   nRBC 0.0 0.0 - 0.2 %  Troponin I (High Sensitivity)     Status: None   Collection Time: 05/13/21  8:51 AM  Result Value Ref Range   Troponin I (High Sensitivity) 8 <18 ng/L  Protime-INR (order if Patient is taking Coumadin / Warfarin)     Status: None   Collection Time: 05/13/21  8:51 AM  Result Value Ref Range   Prothrombin Time 13.5 11.4 - 15.2 seconds   INR 1.0 0.8 - 1.2  Brain natriuretic peptide     Status: None   Collection Time: 05/13/21  9:00 AM  Result Value Ref Range   B Natriuretic Peptide 29.8 0.0 -  100.0 pg/mL  Troponin I (High Sensitivity)     Status: None   Collection Time: 05/13/21 11:46 AM  Result Value Ref Range   Troponin I (High Sensitivity) 6 <18 ng/L  Type and screen [facility]     Status: None   Collection Time: 05/13/21 11:50 AM  Result Value Ref Range   ABO/RH(D) O POS    Antibody Screen NEG    Sample Expiration      05/16/2021,2359 Performed at [facility], [address] [place], [place] [address]   CBC     Status: Abnormal   Collection Time: 05/13/21  3:20 PM  Result Value Ref Range   WBC 15.0 (H) 4.0 - 10.5 K/uL   RBC 3.42 (L) 3.87 - 5.11 MIL/uL   Hemoglobin 9.3 (L) 12.0 - 15.0 g/dL   HCT 30.5 (L) 36.0 - 46.0 %   MCV 89.2 80.0 - 100.0  fL   MCH 27.2 26.0 - 34.0 pg   MCHC 30.5 30.0 - 36.0 g/dL   RDW 18.6 (H) 11.5 - 15.5 %   Platelets 259 150 - 400 K/uL   nRBC 0.0 0.0 - 0.2 %  APTT     Status: Abnormal   Collection Time: 05/13/21  3:20 PM  Result Value Ref Range   aPTT 23 (L) 24 - 36 seconds  CBC     Status: Abnormal   Collection Time: 05/13/21 11:03 PM  Result Value Ref Range   WBC 15.6 (H) 4.0 - 10.5 K/uL   RBC 3.24 (L) 3.87 - 5.11 MIL/uL   Hemoglobin 8.7 (L) 12.0 - 15.0 g/dL   HCT 28.0 (L) 36.0 - 46.0 %   MCV 86.4 80.0 - 100.0 fL   MCH 26.9 26.0 - 34.0 pg   MCHC 31.1 30.0 - 36.0 g/dL   RDW 18.4 (H) 11.5 - 15.5 %   Platelets 264 150 - 400 K/uL   nRBC 0.0 0.0 - 0.2 %  Basic metabolic panel     Status: Abnormal   Collection Time: 05/14/21  6:35 AM  Result Value Ref Range   Sodium 143 135 - 145 mmol/L   Potassium 4.7 3.5 - 5.1 mmol/L   Chloride 108 98 - 111 mmol/L   CO2 28 22 - 32 mmol/L   Glucose, Bld 118 (H) 70 - 99 mg/dL   BUN 23 (H) 6 - 20 mg/dL   Creatinine, Ser 0.77 0.44 - 1.00 mg/dL   Calcium 9.1 8.9 - 10.3 mg/dL   GFR, Estimated >60 >60 mL/min   Anion gap 7 5 - 15  Magnesium     Status: None   Collection Time: 05/14/21  6:35 AM  Result Value Ref Range   Magnesium 2.1 1.7 - 2.4 mg/dL  CBC      Status: Abnormal   Collection Time: 05/14/21  6:35 AM  Result Value Ref Range   WBC 14.7 (H) 4.0 - 10.5 K/uL   RBC 2.95 (L) 3.87 - 5.11 MIL/uL   Hemoglobin 8.0 (L) 12.0 - 15.0 g/dL   HCT 25.3 (L) 36.0 - 46.0 %   MCV 85.8 80.0 - 100.0 fL   MCH 27.1 26.0 - 34.0 pg   MCHC 31.6 30.0 - 36.0 g/dL   RDW 18.6 (H) 11.5 - 15.5 %   Platelets 254 150 - 400 K/uL   nRBC 0.0 0.0 - 0.2 %    SUBJECTIVE:  No acute events.  Feeling better.  Improved fatigue.  No issues with bleeding.  Ambulating.  Tolerating PO  OBJECTIVE:  GEN- NAD, sitting upright in bed eating breakfast NOSE-  Packing in place on left and clear with no active bleeding OC/OP- No active bleeding, small traces of old blood in oropharynx  IMPRESSION:  S/p emergent control of nasal hemorrhage with history of Wegener's and anticoagulation  PLAN:  OK to discharge from ENT perspective.  Continue antibiotics.  Follow up on Monday for packing removal at [facility].  Chriss Mannan 05/14/2021, 8:16 AM

## 2021-05-14 NOTE — Discharge Summary (Signed)
Physician Discharge Summary  Cassandra Brown OBS:[id number] DOB: 07-24-1966 DOA: 05/13/2021  PCP: Baxter Hire, MD  Admit date: 05/13/2021 Discharge date: 05/14/2021  Admitted From: home  Disposition:  home  Recommendations for Outpatient Follow-up:  1. Follow up with PCP in 1-2 weeks 2. F/u w/ ENT, Dr. Pryor Ochoa, on 05/18/21 3. F/u w/ cardio in 1-2 weeks   Home Health: no  Equipment/Devices:  Discharge Condition: stable  CODE STATUS: fyll Diet recommendation: Heart Healthy   Brief/Interim Summary: HPI was taken from Dr. Blaine Hamper: Cassandra Brown is a 55 y.o. female with medical history significant of Wegener's disease-pulm, vasculitis, rheumatoid arthritis, OSA on CPAP, CAD, kidney stone, dCHF, atrial fibrillation (s/p of watchmen, recently switched from Eliquis to Plavix), asthma, who presents with nosebleeding.  Pt states that her nose bleeding started at about 2 AM. She has nausea and spit up blood when she woke up. She states that she was on Eliquis for A fib which was changed to Plavix 1 week ago. Per report, her nose bleeding was significantly in ED.  Patient was treated with clamping, TXA, and nasal packing with balloon pack in ED, but nose bleeding was not controlled. Dr. Pryor Ochoa of ENT was consulted. He did endoscopic Control of Nasal Hemorrhage- Complex. When I saw pt in PACU, her nose bleeding has stopped.  Patient denies any chest pain, cough, shortness breath.  No fever or chills.  Patient has nausea, currently no vomiting, abdominal pain or diarrhea.  No symptoms of UTI.  ED Course: pt was found to have hemoglobin 11.2 (13.8 on 02/27/2021), pending COVID-19 PCR, troponin level 8, 6, INR 1.0, GFR> 60, temperature 96.9, soft blood pressure 93/55, 103/70, heart rate 77, RR 20, oxygen saturation 98% on room air.  Patient is placed on MedSurg bed for observation.  Hospital course from Dr. Jimmye Norman 05/14/21: Pt was found to have epistaxis and is s/p endoscopic cautery as per ENT. Pt has  not had anymore nose bleeding since the cautery was done. Pt was sent home w/ po amox to finish the course of prophylaxis abxs. Pt will f/u w/ ENT, Dr. Pryor Ochoa on 05/18/21. For more information, please see previous progress/consult notes.   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 Continue to hold aspirin, plavix. Continue on amoxicillin.   Wegener's granulomatosis: w/o renal involvement and rheumatoid arthritis. Continue on home dose of methotrexate, prednisone   Chronic a. fib: continue on home dose of propranolol. Restart home dose of verapamil, sotalol at d/c. Will f/u w/ cardio in 1-2 weeks   Asthma: unknown stage and/or severity. Continue on singulair & bronchodilators.   Leukocytosis: likely reactive. Will continue to monitor   Chronic diastolic CHF: echo on 47/65/4650 showed EF of 50%. Appears euvolemic. Continue on home dose of lasix, spironolactone   Morbid obesity: BMI 41.0. Complicates overall care and prognosis    Discharge Instructions  Discharge Instructions    Diet - low sodium heart healthy   Complete by: As directed    Discharge instructions   Complete by: As directed    F/u w/ ENT, Dr. Pryor Ochoa, on 05/18/21. F/u w/ PCP in 1-2 weeks. F/u w/ cardio in 1-2 weeks   Increase activity  slowly   Complete by: As directed    No wound care   Complete by: As directed      Allergies as of 05/14/2021      Reactions   Cyclobenzaprine Hives   Ibuprofen Hives   Morphine Itching, Swelling   Other reaction(s): itching/swelling   Meperidine Itching, Swelling   Sulfa Antibiotics Swelling   Onion Itching   Can use onion powder      Medication List     STOP taking these medications   Eliquis 5 MG Tabs tablet Generic drug: apixaban     TAKE these medications   amoxicillin 500 MG capsule Commonly known as: AMOXIL Take 1 capsule (500 mg total) by mouth every 8 (eight) hours for 4 days.   aspirin EC 81 MG tablet Take 81 mg by mouth daily. Swallow whole.   Bac 50-325-40 MG tablet Generic drug: butalbital-acetaminophen-caffeine Take 1 tablet by mouth every 4 (four) hours as needed for headache.   bumetanide 2 MG tablet Commonly known as: BUMEX Take 2 mg by mouth daily.   carisoprodol 350 MG tablet Commonly known as: SOMA Take 350 mg by mouth 3 (three) times daily.   Centrum Performance Tabs Take 1 tablet by mouth daily.   cholecalciferol 25 MCG (1000 UNIT) tablet Commonly known as: VITAMIN D3 Take 1,000 Units by mouth daily.   clobetasol cream 0.05 % Commonly known as: TEMOVATE Apply 1 application topically 2 (two) times daily as needed (rash).   clopidogrel 75 MG tablet Commonly known as: PLAVIX Take 75 mg by mouth daily.   Dupixent 300 MG/2ML Sopn Generic drug: Dupilumab Inject 300 mg into the skin every 14 (fourteen) days.   Emgality 120 MG/ML Soaj Generic drug: Galcanezumab-gnlm Inject 120 mg into the skin every 28 (twenty-eight) days.   folic acid 1 MG tablet Commonly known as: FOLVITE Take 1 mg by mouth daily.   furosemide 20 MG tablet Commonly known as: LASIX Take 20 mg by mouth daily.   gentamicin ointment 0.1 % Commonly known as: GARAMYCIN Apply 1 application topically daily as needed (rash).   HYDROcodone-acetaminophen 10-325 MG tablet Commonly known as: NORCO Take 1 tablet by mouth every 6 (six) hours.   methotrexate 50 MG/2ML injection Inject 25 mg into the vein once a week.   methotrexate 2.5 MG tablet Commonly known as: RHEUMATREX Take 25 mg by mouth every Friday. Caution:Chemotherapy. Protect from light.   metolazone 5 MG tablet Commonly known as: ZAROXOLYN Take 5 mg by mouth daily  as needed (fluid).   montelukast 10 MG tablet Commonly known as: SINGULAIR Take 10 mg by mouth at bedtime.   naloxone 4 MG/0.1ML Liqd nasal spray kit Commonly known as: NARCAN For opioid overdose or excessive sleepiness after taking narcotics   Nurtec 75 MG Tbdp Generic drug: Rimegepant Sulfate Take 75 mg by mouth daily as needed for migraine.   pantoprazole 40 MG tablet Commonly known as: PROTONIX Take 40 mg by mouth daily.   pentoxifylline 400 MG CR tablet Commonly known as: TRENTAL Take 400 mg by mouth in the morning and at bedtime.   pimecrolimus 1 % cream Commonly known as: ELIDEL Apply 1 application topically 2 (two) times daily as needed (rash).   predniSONE 5 MG tablet Commonly known as: DELTASONE Take 5 mg by mouth daily with breakfast.   pregabalin 50 MG capsule Commonly known as: LYRICA Take 50 mg by mouth 2 (two) times daily.   promethazine 25 MG tablet Commonly known as: PHENERGAN Take 25 mg  by mouth every 6 (six) hours as needed for nausea or vomiting.   propranolol ER 80 MG 24 hr capsule Commonly known as: INDERAL LA Take 80 mg by mouth at bedtime.   rosuvastatin 10 MG tablet Commonly known as: CRESTOR Take 10 mg by mouth at bedtime.   Simponi 50 MG/0.5ML Soaj Generic drug: Golimumab Inject 50 mg into the skin every 30 (thirty) days.   sodium chloride 0.65 % Soln nasal spray Commonly known as: OCEAN Place 1 spray into both nostrils as needed for congestion.   sotalol 80 MG tablet Commonly known as: BETAPACE Take 40 mg by mouth 2 (two) times daily.   spironolactone 25 MG tablet Commonly known as: ALDACTONE Take 25 mg by mouth daily.   sucralfate 1 g tablet Commonly known as: CARAFATE Take 1 g by mouth 2 (two) times daily before a meal.   Trelegy Ellipta 100-62.5-25 MCG/INH Aepb Generic drug: Fluticasone-Umeclidin-Vilant Inhale 1 puff into the lungs daily.   triamcinolone cream 0.1 % Commonly known as: KENALOG Apply 1 application  topically daily as needed (itching).   verapamil 40 MG tablet Commonly known as: CALAN 40 mg 3 (three) times daily.       Follow-up Information    Carloyn Manner, MD Follow up on 05/18/2021.   Specialty: Otolaryngology Contact information: [place] [phone]              Allergies  Allergen Reactions  . Cyclobenzaprine Hives  . Ibuprofen Hives  . Morphine Itching and Swelling    Other reaction(s): itching/swelling   . Meperidine Itching and Swelling  . Sulfa Antibiotics Swelling  . Onion Itching    Can use onion powder    Consultations:  ENT, Dr. Pryor Ochoa    Procedures/Studies:  No results found. Endoscopic cautery of the nose    Subjective: Pt c/o fatigue    Discharge Exam: Vitals:   05/14/21 0806 05/14/21 1116  BP: 109/64 135/80  Pulse: 79 93  Resp: 18 18  Temp: 98.6 F (37 C) 98 F (36.7 C)  SpO2: 99% 100%   Vitals:   05/13/21 2339 05/14/21 0456 05/14/21 0806 05/14/21 1116  BP: 103/66 114/69 109/64 135/80  Pulse: 88 70 79 93  Resp: 20 20 18 18   Temp: 98.5 F (36.9 C) 98 F (36.7 C) 98.6 F (37 C) 98 F (36.7 C)  TempSrc: Oral Oral Oral Oral  SpO2: 99% 98% 99% 100%  Weight:      Height:        General: Pt is alert, awake, not in acute distress Cardiovascular: S1/S2 +, no rubs, no gallops Respiratory: CTA bilaterally, no wheezing, no rhonchi Abdominal: Soft, NT, obese, bowel sounds + Extremities: no cyanosis    The results of significant diagnostics from this hospitalization (including imaging, microbiology, ancillary and laboratory) are listed below for reference.     Microbiology: No results found for this or any previous visit (from the past 240 hour(s)).   Labs: BNP (last 3 results) Recent Labs    05/13/21 0900  BNP 35.3   Basic Metabolic Panel: Recent Labs  Lab 05/13/21 0851 05/14/21 0635  NA 141 143  K 3.7 4.7  CL 103 108  CO2 27 28  GLUCOSE 137* 118*   BUN 29* 23*  CREATININE 1.05* 0.77  CALCIUM 9.3 9.1  MG  --  2.1   Liver Function Tests: No results for input(s): AST, ALT, ALKPHOS, BILITOT, PROT, ALBUMIN in the last 168 hours.  No results for input(s): LIPASE, AMYLASE in the last 168 hours. No results for input(s): AMMONIA in the last 168 hours. CBC: Recent Labs  Lab 05/13/21 0851 05/13/21 1520 05/13/21 2303 05/14/21 0635  WBC 16.8* 15.0* 15.6* 14.7*  HGB 11.2* 9.3* 8.7* 8.0*  HCT 35.9* 30.5* 28.0* 25.3*  MCV 86.3 89.2 86.4 85.8  PLT 369 259 264 254   Cardiac Enzymes: No results for input(s): CKTOTAL, CKMB, CKMBINDEX, TROPONINI in the last 168 hours. BNP: Invalid input(s): POCBNP CBG: No results for input(s): GLUCAP in the last 168 hours. D-Dimer No results for input(s): DDIMER in the last 72 hours. Hgb A1c No results for input(s): HGBA1C in the last 72 hours. Lipid Profile No results for input(s): CHOL, HDL, LDLCALC, TRIG, CHOLHDL, LDLDIRECT in the last 72 hours. Thyroid function studies No results for input(s): TSH, T4TOTAL, T3FREE, THYROIDAB in the last 72 hours.  Invalid input(s): FREET3 Anemia work up No results for input(s): VITAMINB12, FOLATE, FERRITIN, TIBC, IRON, RETICCTPCT in the last 72 hours. Urinalysis    Component Value Date/Time   COLORURINE AMBER (A) 06/19/2020 1219   APPEARANCEUR HAZY (A) 06/19/2020 1219   LABSPEC 1.031 (H) 06/19/2020 1219   PHURINE 5.0 06/19/2020 1219   GLUCOSEU NEGATIVE 06/19/2020 1219   HGBUR NEGATIVE 06/19/2020 1219   Festus 06/19/2020 1219   KETONESUR NEGATIVE 06/19/2020 1219   PROTEINUR NEGATIVE 06/19/2020 1219   NITRITE NEGATIVE 06/19/2020 1219   LEUKOCYTESUR SMALL (A) 06/19/2020 1219   Sepsis Labs Invalid input(s): PROCALCITONIN,  WBC,  LACTICIDVEN Microbiology No results found for this or any previous visit (from the past 240 hour(s)).   Time coordinating discharge: Over 30 minutes  SIGNED:   Wyvonnia Dusky, MD  [facility] 05/14/2021, 2:27 PM Pager   If 7PM-7AM, please contact night-coverage

## 2021-05-14 NOTE — Anesthesia Postprocedure Evaluation (Signed)
Anesthesia Post Note  Patient: Cassandra Brown  Procedure(s) Performed: EPISTAXIS CONTROL (N/A )  Patient location during evaluation: PACU Anesthesia Type: General Level of consciousness: awake and alert Pain management: pain level controlled Vital Signs Assessment: post-procedure vital signs reviewed and stable Respiratory status: spontaneous breathing, nonlabored ventilation, respiratory function stable and patient connected to nasal cannula oxygen Cardiovascular status: blood pressure returned to baseline and stable Postop Assessment: no apparent nausea or vomiting Anesthetic complications: no   No complications documented.   Last Vitals:  Vitals:   05/14/21 0806 05/14/21 1116  BP: 109/64 135/80  Pulse: 79 93  Resp: 18 18  Temp: 37 C 36.7 C  SpO2: 99% 100%    Last Pain:  Vitals:   05/14/21 1116  TempSrc: Oral  PainSc:                  Molli Barrows

## 2021-05-14 NOTE — ED Provider Notes (Signed)
[facility] Emergency Department Provider Note   ____________________________________________   Event Date/Time   First MD Initiated Contact with Patient 05/13/21 [id number]     (approximate)  I have reviewed the triage vital signs and the nursing notes.   HISTORY  Chief Complaint Epistaxis    HPI Cassandra Brown is a 55 y.o. female with the below stated past medical history and significant today for Wegener's granulomatosis and currently taking Plavix who presents for epistaxis that has been present over the last 4 hours.  Patient states that she has been sneezing up blood clots overnight as well as vomiting up blood that has been running on the back of her throat.  Patient denies any exacerbating or relieving factors.  Patient denies any pain.  Patient only complains of nausea/vomiting.         Past Medical History:  Diagnosis Date  . Allergic rhinitis due to pollen   . Angina pectoris ([place])   . CHF (congestive heart failure) ([place])   . Chronic maxillary sinusitis   . Chronic pain syndrome    BACK  . Chronic right sacroiliac joint pain   . Collagen vascular disease ([place])   . Coronary artery abnormality   . Dysrhythmia    AFIB,  FREQ PVC  . Gastroparesis   . GERD (gastroesophageal reflux disease)   . History of kidney stones   . Leukocytosis   . Obesity   . Patent foramen ovale   . Rheumatoid arthritis ([place])    OSTEOARTHRITIS  . Sleep apnea   . Spinal stenosis of lumbar region   . Unequal leg length   . Vasculitis ([place])   . Wegener's disease, pulmonary     Patient Active Problem List   Diagnosis Date Noted  . Epistaxis 05/13/2021  . Bleeding nose 05/13/2021  . Leukocytosis 05/13/2021  . Chronic diastolic CHF (congestive heart failure) ([place]) 05/13/2021  . Normocytic anemia 05/13/2021  . Chronic radicular lumbar pain 01/29/2020  . Status post total right knee replacement 01/20/2020  . Frequent PVCs 01/20/2020  . Chronic sacroiliac  joint pain 02/13/2019  . Atrial fibrillation, chronic ([place]) 01/01/2019  . Asthma 01/01/2019  . 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 Heart failure, unspecified ([place]) 01/01/2019  . Obstructive sleep apnea 01/01/2019  . Rheumatoid arthritis ([place]) 01/01/2019  . 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 ([place]) 10/10/2018  . Osteoarthritis of multiple joints 09/13/2018  . Lumbar radiculopathy, chronic 01/16/2018  . Spinal stenosis of lumbar region without neurogenic claudication 01/16/2018  . Right hip pain 01/16/2018  . Patent foramen ovale 08/17/2017  . Chronic pain syndrome 01/20/2017  . Spondylosis without myelopathy or radiculopathy, lumbar region 01/20/2017    Past Surgical History:  Procedure Laterality Date  . APPENDECTOMY    . CHOLECYSTECTOMY    . JOINT REPLACEMENT Right    knee  . KNEE SURGERY Left   . NASAL HEMORRHAGE CONTROL N/A 05/13/2021   Procedure: EPISTAXIS CONTROL;  Surgeon: Carloyn Manner, MD;  Location: [facility] ORS;  Service: ENT;  Laterality: N/A;  . PULSE GENERATOR IMPLANT Left 06/23/2020   Procedure: LEFT FLANK PULSE GENERATOR IMPLANT;  Surgeon: Deetta Perla, MD;  Location: [facility] ORS;  Service: Neurosurgery;  Laterality: Left;  . SHOULDER SURGERY Bilateral   . THORACIC LAMINECTOMY FOR SPINAL CORD STIMULATOR Bilateral 06/23/2020   Procedure: THORACIC SPINAL CORD STIMULATOR;  Surgeon: Deetta Perla, MD;  Location: [facility] ORS;  Service: Neurosurgery;  Laterality: Bilateral;  . VIDEO BRONCHOSCOPY WITH  ENDOBRONCHIAL NAVIGATION N/A 02/27/2021   Procedure: VIDEO BRONCHOSCOPY WITH ENDOBRONCHIAL NAVIGATION;  Surgeon: Ottie Glazier, MD;  Location: [facility] ORS;  Service: Thoracic;  Laterality: N/A;  . VIDEO BRONCHOSCOPY WITH ENDOBRONCHIAL ULTRASOUND N/A 02/27/2021   Procedure: VIDEO BRONCHOSCOPY WITH ENDOBRONCHIAL ULTRASOUND;  Surgeon: Ottie Glazier, MD;  Location: [facility] ORS;  Service: Thoracic;  Laterality: N/A;    Prior to  Admission medications   Medication Sig Start Date End Date Taking? Authorizing Provider  aspirin EC 81 MG tablet Take 81 mg by mouth daily. Swallow whole.   Yes [provider]  BAC 50-325-40 MG tablet Take 1 tablet by mouth every 4 (four) hours as needed for headache. 02/10/21  Yes [provider]  carisoprodol (SOMA) 350 MG tablet Take 350 mg by mouth 3 (three) times daily.   Yes [provider]  cholecalciferol (VITAMIN D3) 25 MCG (1000 UNIT) tablet Take 1,000 Units by mouth daily.   Yes [provider]  clobetasol cream (TEMOVATE) 1.54 % Apply 1 application topically 2 (two) times daily as needed (rash).   Yes [provider]  clopidogrel (PLAVIX) 75 MG tablet Take 75 mg by mouth daily. 05/05/21  Yes [provider]  DUPIXENT 300 MG/2ML SOPN Inject 300 mg into the skin every 14 (fourteen) days. 05/04/21  Yes [provider]  EMGALITY 120 MG/ML SOAJ Inject 120 mg into the skin every 28 (twenty-eight) days. 02/05/21  Yes [provider]  folic acid (FOLVITE) 1 MG tablet Take 1 mg by mouth daily.   Yes [provider]  furosemide (LASIX) 20 MG tablet Take 20 mg by mouth daily. 12/30/20  Yes [provider]  gentamicin ointment (GARAMYCIN) 0.1 % Apply 1 application topically daily as needed (rash). 09/06/19  Yes [provider]  HYDROcodone-acetaminophen (NORCO) 10-325 MG tablet Take 1 tablet by mouth every 6 (six) hours.   Yes [provider]  methotrexate (RHEUMATREX) 2.5 MG tablet Take 25 mg by mouth every Friday. Caution:Chemotherapy. Protect from light.   Yes [provider]  montelukast (SINGULAIR) 10 MG tablet Take 10 mg by mouth at bedtime.   Yes [provider]  NURTEC 75 MG TBDP Take 75 mg by mouth daily as needed for migraine. 01/15/21  Yes [provider]  pantoprazole (PROTONIX) 40 MG tablet Take 40 mg by mouth daily.   Yes [provider]   pentoxifylline (TRENTAL) 400 MG CR tablet Take 400 mg by mouth in the morning and at bedtime.   Yes [provider]  pimecrolimus (ELIDEL) 1 % cream Apply 1 application topically 2 (two) times daily as needed (rash).   Yes [provider]  predniSONE (DELTASONE) 5 MG tablet Take 5 mg by mouth daily with breakfast.   Yes [provider]  pregabalin (LYRICA) 50 MG capsule Take 50 mg by mouth 2 (two) times daily.    Yes [provider]  promethazine (PHENERGAN) 25 MG tablet Take 25 mg by mouth every 6 (six) hours as needed for nausea or vomiting.    Yes [provider]  propranolol ER (INDERAL LA) 80 MG 24 hr capsule Take 80 mg by mouth at bedtime. 02/09/21  Yes [provider]  rosuvastatin (CRESTOR) 10 MG tablet Take 10 mg by mouth at bedtime. 02/18/21  Yes [provider]  sodium chloride (OCEAN) 0.65 % SOLN nasal spray Place 1 spray into both nostrils as needed for congestion.   Yes [provider]  sotalol (BETAPACE) 80 MG tablet Take 40 mg  by mouth 2 (two) times daily. 05/29/20  Yes [provider]  Specialty Vitamins Products (CENTRUM PERFORMANCE) TABS Take 1 tablet by mouth daily.   Yes [provider]  sucralfate (CARAFATE) 1 g tablet Take 1 g by mouth 2 (two) times daily before a meal. 02/09/21  Yes [provider]  TRELEGY ELLIPTA 100-62.5-25 MCG/INH AEPB Inhale 1 puff into the lungs daily. 01/05/21  Yes [provider]  triamcinolone cream (KENALOG) 0.1 % Apply 1 application topically daily as needed (itching).  01/08/20  Yes [provider]  verapamil (CALAN) 40 MG tablet 40 mg 3 (three) times daily. 05/04/21  Yes [provider]  amoxicillin (AMOXIL) 500 MG capsule Take 1 capsule (500 mg total) by mouth every 8 (eight) hours for 4 days. 05/14/21 05/18/21  Wyvonnia Dusky, MD  bumetanide (BUMEX) 2 MG tablet Take 2 mg by mouth daily. Patient not taking: No sig reported     [provider]  Golimumab (SIMPONI) 50 MG/0.5ML SOAJ Inject 50 mg into the skin every 30 (thirty) days. Patient not taking: No sig reported    [provider]  methotrexate 50 MG/2ML injection Inject 25 mg into the vein once a week. Patient not taking: No sig reported    [provider]  metolazone (ZAROXOLYN) 5 MG tablet Take 5 mg by mouth daily as needed (fluid). Patient not taking: No sig reported 09/17/18   [provider]  naloxone [facility]) nasal spray 4 mg/0.1 mL For opioid overdose or excessive sleepiness after taking narcotics Patient not taking: No sig reported 06/23/20   Lonell Face, NP  spironolactone (ALDACTONE) 25 MG tablet Take 25 mg by mouth daily. Patient not taking: No sig reported    [provider]    Allergies Cyclobenzaprine, Ibuprofen, Morphine, Meperidine, Sulfa antibiotics, and Onion  No family history on file.  Social History Social History   Tobacco Use  . Smoking status: Former Smoker    Packs/day: 3.00    Quit date: 02/06/2011    Years since quitting: 10.2  . Smokeless tobacco: Never Used  Vaping Use  . Vaping Use: Never used  Substance Use Topics  . Alcohol use: Not Currently  . Drug use: Never    Review of Systems Constitutional: No fever/chills Eyes: No visual changes. ENT: No sore throat. Cardiovascular: Denies chest pain. Respiratory: Denies shortness of breath. Gastrointestinal: No abdominal pain.  No nausea, no vomiting.  No diarrhea. Genitourinary: Negative for dysuria. Musculoskeletal: Negative for acute arthralgias Skin: Negative for rash. Neurological: Negative for headaches, weakness/numbness/paresthesias in any extremity Psychiatric: Negative for suicidal ideation/homicidal ideation   ____________________________________________   PHYSICAL EXAM:  VITAL SIGNS: ED Triage Vitals  Enc Vitals Group     BP 05/13/21 0807 (!) 120/92     Pulse Rate 05/13/21 0807 86     Resp  05/13/21 0807 18     Temp 05/13/21 0807 97.9 F (36.6 C)     Temp Source 05/13/21 0807 Oral     SpO2 05/13/21 0807 98 %     Weight 05/13/21 1255 210 lb 1.6 oz (95.3 kg)     Height 05/13/21 1255 5' (1.524 m)     Head Circumference --      Peak Flow --      Pain Score 05/13/21 0804 0     Pain Loc --      Pain Edu? --      Excl. in [place]? --    Constitutional: Alert and oriented. Well appearing  and in no acute distress. Eyes: Conjunctivae are normal. PERRL. Head: Atraumatic. Nose: Left anterior epistaxis at the nare Mouth/Throat: Mucous membranes are moist. Neck: No stridor Cardiovascular: Grossly normal heart sounds.  Good peripheral circulation. Respiratory: Normal respiratory effort.  No retractions. Gastrointestinal: Soft and nontender. No distention. Musculoskeletal: No obvious deformities Neurologic:  Normal speech and language. No gross focal neurologic deficits are appreciated. Skin:  Skin is warm and dry. No rash noted. Psychiatric: Mood and affect are normal. Speech and behavior are normal.  ____________________________________________   LABS (all labs ordered are listed, but only abnormal results are displayed)  Labs Reviewed  BASIC METABOLIC PANEL - Abnormal; Notable for the following components:      Result Value   Glucose, Bld 137 (*)    BUN 29 (*)    Creatinine, Ser 1.05 (*)    All other components within normal limits  CBC - Abnormal; Notable for the following components:   WBC 16.8 (*)    Hemoglobin 11.2 (*)    HCT 35.9 (*)    RDW 18.8 (*)    All other components within normal limits  CBC - Abnormal; Notable for the following components:   WBC 15.0 (*)    RBC 3.42 (*)    Hemoglobin 9.3 (*)    HCT 30.5 (*)    RDW 18.6 (*)    All other components within normal limits  CBC - Abnormal; Notable for the following components:   WBC 15.6 (*)    RBC 3.24 (*)    Hemoglobin 8.7 (*)    HCT 28.0 (*)    RDW 18.4 (*)    All other components within normal limits   APTT - Abnormal; Notable for the following components:   aPTT 23 (*)    All other components within normal limits  BASIC METABOLIC PANEL - Abnormal; Notable for the following components:   Glucose, Bld 118 (*)    BUN 23 (*)    All other components within normal limits  CBC - Abnormal; Notable for the following components:   WBC 14.7 (*)    RBC 2.95 (*)    Hemoglobin 8.0 (*)    HCT 25.3 (*)    RDW 18.6 (*)    All other components within normal limits  SARS CORONAVIRUS 2 (TAT 6-24 HRS)  PROTIME-INR  HIV ANTIBODY (ROUTINE TESTING W REFLEX)  BRAIN NATRIURETIC PEPTIDE  MAGNESIUM  TYPE AND SCREEN  TROPONIN I (HIGH SENSITIVITY)  TROPONIN I (HIGH SENSITIVITY)   ____________________________________________  EKG  ED ECG REPORT I, Naaman Plummer, the attending physician, personally viewed and interpreted this ECG.  Date: 05/14/2021 EKG Time: 0845 Rate: 72 Rhythm: normal sinus rhythm QRS Axis: normal Intervals: normal ST/T Wave abnormalities: normal Narrative Interpretation: no evidence of acute ischemia  PROCEDURES  Procedure(s) performed (including Critical Care):  .Critical Care Performed by: Naaman Plummer, MD Authorized by: Naaman Plummer, MD   Critical care provider statement:    Critical care time (minutes):  41   Critical care time was exclusive of:  Separately billable procedures and treating other patients   Critical care was necessary to treat or prevent imminent or life-threatening deterioration of the following conditions:  Circulatory failure   Critical care was time spent personally by me on the following activities:  Discussions with consultants, evaluation of patient's response to treatment, examination of patient, ordering and performing treatments and interventions, ordering and review of laboratory studies, ordering and review of radiographic studies, pulse oximetry, re-evaluation of  patient's condition, obtaining history from patient or surrogate and  review of old charts   I assumed direction of critical care for this patient from another provider in my specialty: no     Care discussed with: admitting provider   .1-3 Lead EKG Interpretation Performed by: Naaman Plummer, MD Authorized by: Naaman Plummer, MD     Interpretation: normal     ECG rate:  92   ECG rate assessment: normal     Rhythm: sinus rhythm     Ectopy: none     Conduction: normal       ____________________________________________   INITIAL IMPRESSION / ASSESSMENT AND PLAN / ED COURSE  As part of my medical decision making, I reviewed the following data within the [place] notes reviewed and incorporated, Labs reviewed, EKG interpreted, Old chart reviewed, Radiograph reviewed and Notes from prior ED visits reviewed and incorporated     The bleeding source is unclear and may be posterior. Blood clots were cleared by having the patient blow them out in a towel. After this a nasal clamp was applied, but this did not stop the bleeding.  Additional efforts were made to stop the bleeding including packing with TXA impregnated packing, but since this was not successful, ENT was consulted to come to the emergency department to provide further care.       ____________________________________________   FINAL CLINICAL IMPRESSION(S) / ED DIAGNOSES  Final diagnoses:  Epistaxis, recurrent     ED Discharge Orders         Ordered    amoxicillin (AMOXIL) 500 MG capsule  Every 8 hours        05/14/21 1236    Increase activity slowly        05/14/21 1236    Diet - low sodium heart healthy        05/14/21 1236    Discharge instructions       Comments: F/u w/ ENT, Dr. Pryor Ochoa, on 05/18/21. F/u w/ PCP in 1-2 weeks. F/u w/ cardio in 1-2 weeks   05/14/21 1236    No wound care        05/14/21 1236           Note:  This document was prepared using Dragon voice recognition software and may include unintentional dictation errors.    Naaman Plummer, MD 05/14/21 1339

## 2021-05-14 NOTE — Progress Notes (Signed)
AVS reviewed and pt verbalized understanding of all instructions. Follow up appointment discussed as well and pt denies any concerns or questions. NAD noted prior to discharge.

## 2021-06-17 ENCOUNTER — Encounter: Payer: Self-pay | Admitting: Pulmonary Disease

## 2021-06-17 ENCOUNTER — Ambulatory Visit: Payer: Medicare Other | Admitting: Pulmonary Disease

## 2021-06-17 ENCOUNTER — Other Ambulatory Visit: Payer: Self-pay

## 2021-06-17 VITALS — BP 134/76 | HR 87 | Temp 97.6°F | Ht 60.0 in | Wt 213.2 lb

## 2021-06-17 DIAGNOSIS — Z95818 Presence of other cardiac implants and grafts: Secondary | ICD-10-CM

## 2021-06-17 DIAGNOSIS — M313 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: Secondary | ICD-10-CM | POA: Diagnosis not present

## 2021-06-17 DIAGNOSIS — R040 Epistaxis: Secondary | ICD-10-CM

## 2021-06-17 DIAGNOSIS — J449 Chronic obstructive pulmonary disease, unspecified: Secondary | ICD-10-CM | POA: Diagnosis not present

## 2021-06-17 MED ORDER — TRELEGY ELLIPTA 200-62.5-25 MCG/INH IN AEPB: 1.0000 | INHALATION_SPRAY | Freq: Every day | RESPIRATORY_TRACT | 0 refills | Status: DC

## 2021-06-17 NOTE — Patient Instructions (Signed)
Make sure they have an appointment with Dr. Claudette Stapler.  I have given you a extra sample of Trelegy the 200 mcg size.  Make sure you rinse your mouth well after use it is 1 puff daily.  May want to call Dr. Teodoro Kil office for refills and to make a follow-up appointment.   We will see you here on an as-needed basis.  I will discuss with Dr.Aleskerov what to monitor for to determine if you need the cryotherapy.

## 2021-06-17 NOTE — Progress Notes (Signed)
Subjective:    Patient ID: Cassandra Brown, female    DOB: [birth year], 55 y.o.   MRN: [id number] Chief Complaint  Patient presents with   Follow-up    C/o sob with exertion and prod cough(unsure color of sputum)    HPI The patient is a 55 year old former smoker (quit 2014), who has a very complex history of connective tissue disease to include rheumatoid arthritis, leukocytoclastic vasculitis and granulomatosis with polyangiitis involving the lung who presents for evaluation of granulomatous infiltration in the trachea for follow-up of consideration of spray cryotherapy to granulomatous lesions.  Recall that she was seen initially on 04/30/2021.  She had a watchman procedure on 16 April and had severe epistaxis with nasal hemorrhage that required surgical intervention by Dr. Carloyn Manner on 18 May.  She required cautery and packing and is still recovering from this issue.  She has now been taken off all anticoagulants.  She had just been started on Plavix when the epistaxis occurred.  She had significant bleeding with loss of at least 3 g of hemoglobin.  The patient notes that she occasionally has shortness of breath and cough which previously responded to Trelegy which was given to her as a sample by Dr. Claudette Stapler.  She has meant to call his office for a refill.  Currently she has no symptoms of stridor and her dyspnea is at baseline and mostly related to her COPD.  She has not had any fevers, chills or sweats.  She does not endorse any other symptomatology.  She still would like to hold off on procedures for now.   Review of Systems A 10 point review of systems was performed and it is as noted above otherwise negative.  Patient Active Problem List   Diagnosis Date Noted   Epistaxis 05/13/2021   Bleeding nose 05/13/2021   Leukocytosis 05/13/2021   Chronic diastolic CHF (congestive heart failure) ([place]) 05/13/2021   Normocytic anemia 05/13/2021   Chronic radicular lumbar pain  01/29/2020   Status post total right knee replacement 01/20/2020   Frequent PVCs 01/20/2020   Chronic sacroiliac joint pain 02/13/2019   Atrial fibrillation, chronic ([place]) 01/01/2019   Asthma 01/01/2019   Coronary artery disease involving native heart with angina pectoris ([place]) 01/01/2019   Heart failure, unspecified ([place]) 01/01/2019   Obstructive sleep apnea 01/01/2019   Rheumatoid arthritis ([place]) 01/01/2019   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 ([place]) 10/10/2018   Osteoarthritis of multiple joints 09/13/2018   Lumbar radiculopathy, chronic 01/16/2018   Spinal stenosis of lumbar region without neurogenic claudication 01/16/2018   Right hip pain 01/16/2018   Patent foramen ovale 08/17/2017   Chronic pain syndrome 01/20/2017   Spondylosis without myelopathy or radiculopathy, lumbar region 01/20/2017   Social History   Tobacco Use   Smoking status: Former    Packs/day: 3.00    Years: 33.00    Pack years: 99.00    Types: Cigarettes    Quit date: 02/06/2011    Years since quitting: 10.3   Smokeless tobacco: Never  Substance Use Topics   Alcohol use: Not Currently   Allergies  Allergen Reactions   Cyclobenzaprine Hives   Ibuprofen Hives   Morphine Itching and Swelling    Other reaction(s): itching/swelling    Meperidine Itching and Swelling   Sulfa Antibiotics Swelling   Onion Itching    Can use onion powder   Current Meds  Medication Sig   aspirin EC 81 MG tablet Take 81 mg by mouth daily. Swallow whole.  BAC 50-325-40 MG tablet Take 1 tablet by mouth every 4 (four) hours as needed for headache.   carisoprodol (SOMA) 350 MG tablet Take 350 mg by mouth 3 (three) times daily.   cholecalciferol (VITAMIN D3) 25 MCG (1000 UNIT) tablet Take 1,000 Units by mouth daily.   clobetasol cream (TEMOVATE) 4.16 % Apply 1 application topically 2 (two) times daily as needed (rash).   DUPIXENT 300 MG/2ML SOPN Inject 300 mg into the skin every 14 (fourteen) days.    EMGALITY 120 MG/ML SOAJ Inject 120 mg into the skin every 28 (twenty-eight) days.   folic acid (FOLVITE) 1 MG tablet Take 1 mg by mouth daily.   furosemide (LASIX) 20 MG tablet Take 20 mg by mouth daily.   gentamicin ointment (GARAMYCIN) 0.1 % Apply 1 application topically daily as needed (rash).   Golimumab (SIMPONI) 50 MG/0.5ML SOAJ Inject 50 mg into the skin every 30 (thirty) days.   HYDROcodone-acetaminophen (NORCO) 10-325 MG tablet Take 1 tablet by mouth every 6 (six) hours.   HYDROcodone-acetaminophen (NORCO) 10-325 MG tablet Take 1 tablet by mouth every 6 (six) hours as needed.   methotrexate 2.5 MG tablet Take 10 mg by mouth once a week. Caution:Chemotherapy. Protect from light.   montelukast (SINGULAIR) 10 MG tablet Take 10 mg by mouth at bedtime.   NURTEC 75 MG TBDP Take 75 mg by mouth daily as needed for migraine.   pantoprazole (PROTONIX) 40 MG tablet Take 40 mg by mouth daily.   pentoxifylline (TRENTAL) 400 MG CR tablet Take 400 mg by mouth in the morning and at bedtime.   pimecrolimus (ELIDEL) 1 % cream Apply 1 application topically 2 (two) times daily as needed (rash).   predniSONE (DELTASONE) 5 MG tablet Take 5 mg by mouth daily with breakfast.   pregabalin (LYRICA) 50 MG capsule Take 50 mg by mouth 2 (two) times daily.    promethazine (PHENERGAN) 25 MG tablet Take 25 mg by mouth every 6 (six) hours as needed for nausea or vomiting.    propranolol ER (INDERAL LA) 80 MG 24 hr capsule Take 80 mg by mouth at bedtime.   rosuvastatin (CRESTOR) 10 MG tablet Take 10 mg by mouth at bedtime.   sodium chloride (OCEAN) 0.65 % SOLN nasal spray Place 1 spray into both nostrils as needed for congestion.   sotalol (BETAPACE) 80 MG tablet Take 40 mg by mouth 2 (two) times daily.   Specialty Vitamins Products (CENTRUM PERFORMANCE) TABS Take 1 tablet by mouth daily.   sucralfate (CARAFATE) 1 g tablet Take 1 g by mouth 2 (two) times daily before a meal.   triamcinolone cream (KENALOG) 0.1 % Apply  1 application topically daily as needed (itching).    [DISCONTINUED] methotrexate (RHEUMATREX) 2.5 MG tablet Take 25 mg by mouth every Friday. Caution:Chemotherapy. Protect from light.   [DISCONTINUED] methotrexate 50 MG/2ML injection Inject 25 mg into the vein once a week.   [DISCONTINUED] metolazone (ZAROXOLYN) 5 MG tablet Take 5 mg by mouth daily as needed (fluid).   [DISCONTINUED] spironolactone (ALDACTONE) 25 MG tablet Take 25 mg by mouth daily.   Immunization History  Administered Date(s) Administered   Influenza, Seasonal, Injecte, Preservative Fre 10/29/2008   Influenza,inj,Quad PF,6+ Mos 10/03/2015, 10/26/2016, 10/13/2017, 09/29/2018, 10/10/2020   Influenza,inj,quad, With Preservative 12/17/2014   Influenza-Unspecified 10/03/2015, 10/26/2016, 10/13/2017, 09/29/2018, 10/04/2019   PFIZER(Purple Top)SARS-COV-2 Vaccination 03/13/2020, 04/03/2020, 08/19/2020   Pneumococcal Polysaccharide-23 08/03/2016   Tdap 11/05/2016       Objective:   Physical Exam BP 134/76 (  BP Location: Left Arm, Cuff Size: Large)   Pulse 87   Temp 97.6 F (36.4 C) (Temporal)   Ht 5' (1.524 m)   Wt 213 lb 3.2 oz (96.7 kg)   SpO2 95%   BMI 41.64 kg/m  GENERAL: Obese woman, no acute distress, fully ambulatory, no conversational dyspnea. HEAD: Normocephalic, atraumatic. EYES: Pupils equal, round, reactive to light.  No scleral icterus. MOUTH: Nose/mouth/throat not examined due to masking requirements for COVID 19. NECK: Supple. No thyromegaly. Trachea midline. No JVD.  No adenopathy. PULMONARY: Good air entry bilaterally.  Coarse otherwise, no adventitious sounds.  No stridor. CARDIOVASCULAR: S1 and S2. Regular rate and rhythm.  No rubs, murmurs or gallops heard. ABDOMEN: Obese otherwise benign. MUSCULOSKELETAL: No joint deformity, no clubbing, no edema. NEUROLOGIC: No focal deficit, no gait disturbance, speech is fluent. SKIN: Intact,warm,dry. PSYCH: Mood and behavior normal.     Assessment & Plan:      ICD-10-CM   1. Granulomatosis with polyangiitis with pulmonary involvement ([place])  M31.30    She has granulomatous involvement of the trachea She is under treatment with rituximab Currently no evidence of stridor F/U symptoms and flow-volume loop    2. Nasal hemorrhage  R04.0    Related to granulomatosis with polyangiitis involvement of the nose Significant blood loss, recovering Followed by ENT, Dr. Pryor Ochoa    3. Presence of Watchman left atrial appendage closure device  Z95.818    Off of all anticoagulants     4. Asthma-COPD overlap syndrome ([place])  J44.9    Noted improvement with Trelegy Gave sample of Trelegy 200/62.5/25 Refills per Dr. Claudette Stapler      Meds ordered this encounter  Medications   Fluticasone-Umeclidin-Vilant (TRELEGY ELLIPTA) 200-62.5-25 MCG/INH AEPB    Sig: Inhale 1 puff into the lungs daily.    Dispense:  14 each    Refill:  0    Order Specific Question:   Lot Number?    Answer:   [facility]    Order Specific Question:   Expiration Date?    Answer:   11/26/2022    Order Specific Question:   Manufacturer?    Answer:   [employer] [[id number]]    Order Specific Question:   Quantity    Answer:   1   Discussion:  The patient follows for the issue of granulomatous infiltration of the trachea due to granulomatosis with polyangiitis and pulmonary involvement.  Should her symptoms worsen she would be a candidate for spray cryotherapy.  Currently she is asymptomatic.  Would follow-up clinically monitoring for stridor and also following flow-volume loop which can indicate if upper airway obstruction is becoming an issue.  She ran out of her Trelegy Ellipta sample which was provided by Dr. Lanney Gins.  Will provide her with Trelegy Ellipta 200/62.5/25 today.  She is to call Dr. Lanney Gins for refill.  I think the added steroid will help also with her granulomatous infiltration in the trachea.  I am grateful for the opportunity to evaluate this patient.  For now she wants  to defer procedures but will be happy to see her in the future should this become necessary.  Renold Don, MD [facility]   *This note was dictated using voice recognition software/Dragon.  Despite best efforts to proofread, errors can occur which can change the meaning.  Any change was purely unintentional.

## 2021-06-22 ENCOUNTER — Other Ambulatory Visit: Payer: Self-pay

## 2021-06-22 ENCOUNTER — Ambulatory Visit
Payer: Medicare Other | Attending: Student in an Organized Health Care Education/Training Program | Admitting: Student in an Organized Health Care Education/Training Program

## 2021-06-22 ENCOUNTER — Other Ambulatory Visit: Payer: Self-pay | Admitting: Student in an Organized Health Care Education/Training Program

## 2021-06-22 ENCOUNTER — Encounter: Payer: Self-pay | Admitting: Student in an Organized Health Care Education/Training Program

## 2021-06-22 VITALS — BP 153/74 | HR 73 | Temp 97.3°F | Resp 16 | Ht 62.0 in | Wt 213.0 lb

## 2021-06-22 DIAGNOSIS — M5417 Radiculopathy, lumbosacral region: Secondary | ICD-10-CM

## 2021-06-22 DIAGNOSIS — Z9689 Presence of other specified functional implants: Secondary | ICD-10-CM | POA: Diagnosis not present

## 2021-06-22 DIAGNOSIS — M5416 Radiculopathy, lumbar region: Secondary | ICD-10-CM

## 2021-06-22 DIAGNOSIS — G894 Chronic pain syndrome: Secondary | ICD-10-CM | POA: Diagnosis not present

## 2021-06-22 NOTE — Progress Notes (Signed)
PROVIDER NOTE: Information contained herein reflects review and annotations entered in association with encounter. Interpretation of such information and data should be left to medically-trained personnel. Information provided to patient can be located elsewhere in the medical record under "Patient Instructions". Document created using STT-dictation technology, any transcriptional errors that may result from process are unintentional.    Patient: Cassandra Brown  Service Category: E/M  Provider: Gillis Santa, MD  DOB: 08-18-1966  DOS: 06/22/2021  Specialty: Interventional Pain Management  MRN: [id number]  Setting: Ambulatory outpatient  PCP: Cassandra Hire, MD  Type: Established Patient    Referring Provider: Baxter Hire, MD  Location: Office  Delivery: Face-to-face     HPI  [name], a 55 y.o. year old female, is here today because of her Lumbar radiculopathy, chronic [M54.16]. Cassandra Brown primary complain today is Back Pain (Lumbar left is worse ) Last encounter: My last encounter with her was on 05/28/20 Pertinent problems: Cassandra Brown does not have any pertinent problems on file. Pain Assessment: Severity of Chronic pain is reported as a 8 /10. Location: Back Lower, Left/left hip and beginning to go down left leg. Onset: More than a month ago. Quality: Discomfort, Constant, Sharp. Timing: Constant. Modifying factor(s): changing positions, medications are not helping right now nor is the SCS. Vitals:  height is _0  (1.575 m) and weight is 213 lb (96.6 kg). Her temporal temperature is 97.3 F (36.3 C) (abnormal). Her blood pressure is 153/74 (abnormal) and her pulse is 73. Her respiration is 16 and oxygen saturation is 100%.   Reason for encounter: worsening of previously known (established) problem    Patient presents today with an increase in her left lumbar radicular pain.  She is status post spinal cord stimulator implant with Cassandra Brown on 06/23/2020. She noticed an increase  in her pain when she was in the emergency department with epistaxis with associated vomiting. Her increased left lumbar radicular pain began almost 1 month ago.  She is also having pain in her left hip and down her left posterior lateral leg. Medication management per PCP Today we discussed lumbar MRI and therapeutic/palliative epidural steroid injection for acute on chronic lumbar radicular pain flare in the context of spinal cord stimulator.  ROS  Constitutional: Denies any fever or chills Gastrointestinal: No reported hemesis, hematochezia, vomiting, or acute GI distress Musculoskeletal:  Low back, left leg pain Neurological: No reported episodes of acute onset apraxia, aphasia, dysarthria, agnosia, amnesia, paralysis, loss of coordination, or loss of consciousness  Medication Review  Centrum Performance, Dupilumab, Fluticasone-Umeclidin-Vilant, Galcanezumab-gnlm, Golimumab, HYDROcodone-acetaminophen, Rimegepant Sulfate, aspirin EC, butalbital-acetaminophen-caffeine, carisoprodol, cholecalciferol, clobetasol cream, folic acid, furosemide, gentamicin ointment, methotrexate, montelukast, naloxone, pantoprazole, pentoxifylline, pimecrolimus, predniSONE, pregabalin, promethazine, propranolol ER, rosuvastatin, sodium chloride, sotalol, sucralfate, and triamcinolone cream  History Review  Allergy: Cassandra Brown is allergic to cyclobenzaprine, ibuprofen, morphine, meperidine, sulfa antibiotics, and onion. Drug: Cassandra Brown  reports no history of drug use. Alcohol:  reports previous alcohol use. Tobacco:  reports that she quit smoking about 10 years ago. Her smoking use included cigarettes. She has a 99.00 pack-year smoking history. She has never used smokeless tobacco. Social: Cassandra Brown  reports that she quit smoking about 10 years ago. Her smoking use included cigarettes. She has a 99.00 pack-year smoking history. She has never used smokeless tobacco. She reports previous alcohol use. She reports that  she does not use drugs. Medical:  has a past medical history of Allergic rhinitis due to pollen, Angina pectoris (  [place]), CHF (congestive heart failure) ([place]), Chronic maxillary sinusitis, Chronic pain syndrome, Chronic right sacroiliac joint pain, Collagen vascular disease ([place]), Coronary artery abnormality, Dysrhythmia, Gastroparesis, GERD (gastroesophageal reflux disease), History of kidney stones, Leukocytosis, Obesity, Patent foramen ovale, Rheumatoid arthritis ([place]), Sleep apnea, Spinal stenosis of lumbar region, Unequal leg length, Vasculitis ([place]), and Wegener's disease, pulmonary. Surgical: Cassandra Brown  has a past surgical history that includes Joint replacement (Right); Knee surgery (Left); Shoulder surgery (Bilateral); Appendectomy; Cholecystectomy; Thoracic laminectomy for spinal cord stimulator (Bilateral, 06/23/2020); Pulse generator implant (Left, 06/23/2020); Video bronchoscopy with endobronchial ultrasound (N/A, 02/27/2021); Video bronchoscopy with endobronchial navigation (N/A, 02/27/2021); and Nasal hemorrhage control (N/A, 05/13/2021). Family: family history is not on file.  Laboratory Chemistry Profile   Renal Lab Results  Component Value Date   BUN 23 (H) 05/14/2021   CREATININE 0.77 05/14/2021   GFRAA >60 06/19/2020   GFRNONAA >60 05/14/2021     Hepatic No results found for: AST, ALT, ALBUMIN, ALKPHOS, HCVAB, AMYLASE, LIPASE, AMMONIA   Electrolytes Lab Results  Component Value Date   NA 143 05/14/2021   K 4.7 05/14/2021   CL 108 05/14/2021   CALCIUM 9.1 05/14/2021   MG 2.1 05/14/2021     Bone No results found for: VD25OH, VD125OH2TOT, HA1937TK2, IO9735HG9, 25OHVITD1, 25OHVITD2, 25OHVITD3, TESTOFREE, TESTOSTERONE   Inflammation (CRP: Acute Phase) (ESR: Chronic Phase) No results found for: CRP, ESRSEDRATE, LATICACIDVEN     Note: Above Lab results reviewed.  Recent Imaging Review  DG Chest Port 1 View CLINICAL DATA:  Patient status post bronchoscopy.  EXAM: PORTABLE  CHEST 1 VIEW  COMPARISON:  CT chest 01/21/2021.  FINDINGS: There is no pneumothorax after bronchoscopy. Multiple cavitary lesions throughout both lungs are identified as seen on prior CT. Heart size is upper normal. No pleural fluid. Spinal stimulator noted.  IMPRESSION: Negative for pneumothorax after bronchoscopy.  Multiple bilateral cavitary nodules as seen on prior chest CT.  Electronically Signed   By: Inge Rise M.D.   On: 02/27/2021 15:17 DG C-Arm 1-60 Min-No Report Fluoroscopy was utilized by the requesting physician.  No radiographic  interpretation.  Note: Reviewed        Physical Exam  General appearance: Well nourished, well developed, and well hydrated. In no apparent acute distress Mental status: Alert, oriented x 3 (person, place, & time)       Respiratory: No evidence of acute respiratory distress Eyes: PERLA Vitals: BP (!) 153/74 (BP Location: Left Arm, Patient Position: Sitting, Cuff Size: Large)   Pulse 73   Temp (!) 97.3 F (36.3 C) (Temporal)   Resp 16   Ht _0  (1.575 m)   Wt 213 lb (96.6 kg)   SpO2 100%   BMI 38.96 kg/m  BMI: Estimated body mass index is 38.96 kg/m as calculated from the following:   Height as of this encounter: _1  (1.575 m).   Weight as of this encounter: 213 lb (96.6 kg). Ideal: Ideal body weight: 50.1 kg (110 lb 7.2 oz) Adjusted ideal body weight: 68.7 kg (151 lb 7.5 oz)  Lumbar Spine Area Exam  Skin & Axial Inspection: No masses, redness, or swelling Alignment: Symmetrical Functional ROM: Pain restricted ROM on the left       Stability: No instability detected Muscle Tone/Strength: Functionally intact. No obvious neuro-muscular anomalies detected. Sensory (Neurological): Dermatomal pain pattern left Palpation: No palpable anomalies       Provocative Tests: Positive straight leg raise test on the left Gait & Posture Assessment  Ambulation: Limited Gait:  Antalgic gait (limping) Posture: WNL  Lower Extremity  Exam    Side: Right lower extremity  Side: Left lower extremity  Stability: No instability observed          Stability: No instability observed          Skin & Extremity Inspection: Skin color, temperature, and hair growth are WNL. No peripheral edema or cyanosis. No masses, redness, swelling, asymmetry, or associated skin lesions. No contractures.  Skin & Extremity Inspection: Skin color, temperature, and hair growth are WNL. No peripheral edema or cyanosis. No masses, redness, swelling, asymmetry, or associated skin lesions. No contractures.  Functional ROM: Unrestricted ROM                  Functional ROM: Pain restricted ROM left hip left leg                  Muscle Tone/Strength: Functionally intact. No obvious neuro-muscular anomalies detected.  Muscle Tone/Strength: Functionally intact. No obvious neuro-muscular anomalies detected.  Sensory (Neurological): Unimpaired        Sensory (Neurological): Dermatomal pain pattern      positive straight leg raise test  DTR: Patellar: deferred today Achilles: deferred today Plantar: deferred today  DTR: Patellar: deferred today Achilles: deferred today Plantar: deferred today  Palpation: No palpable anomalies  Palpation: No palpable anomalies    Assessment   Status Diagnosis  Having a Flare-up Having a Flare-up Having a Flare-up 1. Lumbar radiculopathy, chronic (LEFT)    2. Lumbosacral radiculopathy at L5 (left)   3. Chronic pain syndrome   4. Spinal cord stimulator status ([employer], implant: Dr Lacinda Brown)       Plan of Care    Acute on chronic left lumbar radicular pain flare.  Patient states that it started while she was in the emergency department for epistaxis and vomiting.  It is possible that while she was vomiting, increased intra-abdominal pressure may have worsened any disc herniation that was already present contributing to an acute lumbar neuritis picture.  She is having trouble ambulating given pain along her left lateral thigh.   She states that her trip to [place] is on hold given the increase in her pain.  She is on a steroid taper at the moment.  We discussed repeat imaging in the form of a lumbar MRI without contrast and also a therapeutic/palliative epidural steroid injection.  Patient endorsed understanding.  Orders:  Orders Placed This Encounter  Procedures   Lumbar Epidural Injection    Standing Status:   Future    Standing Expiration Date:   07/22/2021    Scheduling Instructions:     Procedure: Interlaminar Lumbar Epidural Steroid injection (LESI)            Laterality: Midline     Sedation: Patient's choice.     Timeframe: ASAA    Order Specific Question:   Where will this procedure be performed?    Answer:   [facility]   Lumbar Epidural Injection    Standing Status:   Future    Standing Expiration Date:   07/22/2021    Scheduling Instructions:     Procedure: Interlaminar Lumbar Epidural Steroid injection (LESI)            Laterality: Midline     Sedation: Patient's choice.     Timeframe: ASAA    Order Specific Question:   Where will this procedure be performed?    Answer:   [facility]   MR LUMBAR SPINE  WO CONTRAST    Patient presents with axial pain with possible radicular component.  In addition to any acute findings, please report on:  1. Facet (Zygapophyseal) joint DJD (Hypertrophy, space narrowing, subchondral sclerosis, and/or osteophyte formation) 2. DDD and/or IVDD (Loss of disc height, desiccation or "Black disc disease") 3. Pars defects 4. Spondylolisthesis, spondylosis, and/or spondyloarthropathies (include Degree/Grade of displacement in mm) 5. Vertebral body Fractures, including age (old, new/acute) 76. Modic Type Changes 7. Demineralization 8. Bone pathology 9. Central, Lateral Recess, and/or Foraminal Stenosis (include AP diameter of stenosis in mm) 10. Surgical changes (hardware type, status, and presence of fibrosis)  NOTE: Please specify level(s) and  laterality.    Standing Status:   Future    Standing Expiration Date:   07/22/2021    Scheduling Instructions:     Imaging must be done as soon as possible. Inform patient that order will expire within 30 days and I will not renew it.    Order Specific Question:   What is the patient's sedation requirement?    Answer:   No Sedation    Order Specific Question:   Does the patient have a pacemaker or implanted devices?    Answer:   No    Order Specific Question:   Preferred imaging location?    Answer:   [facility] (table limit-350lbs)    Order Specific Question:   Call Results- Best Contact Number?    Answer:   [phone] ([facility])    Order Specific Question:   Radiology Contrast Protocol - do NOT remove file path    Answer:   [email or web address]   Follow-up plan:   Return in about 4 weeks (around 07/20/2021) for L-ESI without sedation.    Recent Visits No visits were found meeting these conditions. Showing recent visits within past 90 days and meeting all other requirements Today's Visits Date Type Provider Dept  06/22/21 Office Visit Cassandra Santa, MD [facility]  Showing today's visits and meeting all other requirements Future Appointments No visits were found meeting these conditions. Showing future appointments within next 90 days and meeting all other requirements I discussed the assessment and treatment plan with the patient. The patient was provided an opportunity to ask questions and all were answered. The patient agreed with the plan and demonstrated an understanding of the instructions.  Patient advised to call back or seek an in-person evaluation if the symptoms or condition worsens.  Duration of encounter:68mnutes.  Note by: BGillis Santa MD Date: 06/22/2021; Time: 2:04 PM

## 2021-06-22 NOTE — Progress Notes (Signed)
Safety precautions to be maintained throughout the outpatient stay will include: orient to surroundings, keep bed in low position, maintain call bell within reach at all times, provide assistance with transfer out of bed and ambulation.  

## 2021-06-22 NOTE — Patient Instructions (Signed)
____________________________________________________________________________________________  General Risks and Possible Complications  Patient Responsibilities: It is important that you read this as it is part of your informed consent. It is our duty to inform you of the risks and possible complications associated with treatments offered to you. It is your responsibility as a patient to read this and to ask questions about anything that is not clear or that you believe was not covered in this document.  Patient's Rights: You have the right to refuse treatment. You also have the right to change your mind, even after initially having agreed to have the treatment done. However, under this last option, if you wait until the last second to change your mind, you may be charged for the materials used up to that point.  Introduction: Medicine is not an exact science. Everything in Medicine, including the lack of treatment(s), carries the potential for danger, harm, or loss (which is by definition: Risk). In Medicine, a complication is a secondary problem, condition, or disease that can aggravate an already existing one. All treatments carry the risk of possible complications. The fact that a side effects or complications occurs, does not imply that the treatment was conducted incorrectly. It must be clearly understood that these can happen even when everything is done following the highest safety standards.  No treatment: You can choose not to proceed with the proposed treatment alternative. The "PRO(s)" would include: avoiding the risk of complications associated with the therapy. The "CON(s)" would include: not getting any of the treatment benefits. These benefits fall under one of three categories: diagnostic; therapeutic; and/or palliative. Diagnostic benefits include: getting information which can ultimately lead to improvement of the disease or symptom(s). Therapeutic benefits are those associated with  the successful treatment of the disease. Finally, palliative benefits are those related to the decrease of the primary symptoms, without necessarily curing the condition (example: decreasing the pain from a flare-up of a chronic condition, such as incurable terminal cancer).  General Risks and Complications: These are associated to most interventional treatments. They can occur alone, or in combination. They fall under one of the following six (6) categories: no benefit or worsening of symptoms; bleeding; infection; nerve damage; allergic reactions; and/or death. No benefits or worsening of symptoms: In Medicine there are no guarantees, only probabilities. No healthcare provider can ever guarantee that a medical treatment will work, they can only state the probability that it may. Furthermore, there is always the possibility that the condition may worsen, either directly, or indirectly, as a consequence of the treatment. Bleeding: This is more common if the patient is taking a blood thinner, either prescription or over the counter (example: Goody Powders, Fish oil, Aspirin, Garlic, etc.), or if suffering a condition associated with impaired coagulation (example: Hemophilia, cirrhosis of the liver, low platelet counts, etc.). However, even if you do not have one on these, it can still happen. If you have any of these conditions, or take one of these drugs, make sure to notify your treating physician. Infection: This is more common in patients with a compromised immune system, either due to disease (example: diabetes, cancer, human immunodeficiency virus [HIV], etc.), or due to medications or treatments (example: therapies used to treat cancer and rheumatological diseases). However, even if you do not have one on these, it can still happen. If you have any of these conditions, or take one of these drugs, make sure to notify your treating physician. Nerve Damage: This is more common when the treatment is an    invasive one, but it can also happen with the use of medications, such as those used in the treatment of cancer. The damage can occur to small secondary nerves, or to large primary ones, such as those in the spinal cord and brain. This damage may be temporary or permanent and it may lead to impairments that can range from temporary numbness to permanent paralysis and/or brain death. Allergic Reactions: Any time a substance or material comes in contact with our body, there is the possibility of an allergic reaction. These can range from a mild skin rash (contact dermatitis) to a severe systemic reaction (anaphylactic reaction), which can result in death. Death: In general, any medical intervention can result in death, most of the time due to an unforeseen complication. ____________________________________________________________________________________________   Epidural Steroid Injection Patient Information  Description: The epidural space surrounds the nerves as they exit the spinal cord.  In some patients, the nerves can be compressed and inflamed by a bulging disc or a tight spinal canal (spinal stenosis).  By injecting steroids into the epidural space, we can bring irritated nerves into direct contact with a potentially helpful medication.  These steroids act directly on the irritated nerves and can reduce swelling and inflammation which often leads to decreased pain.  Epidural steroids may be injected anywhere along the spine and from the neck to the low back depending upon the location of your pain.   After numbing the skin with local anesthetic (like Novocaine), a small needle is passed into the epidural space slowly.  You may experience a sensation of pressure while this is being done.  The entire block usually last less than 10 minutes.  Conditions which may be treated by epidural steroids:  Low back and leg pain Neck and arm pain Spinal stenosis Post-laminectomy syndrome Herpes zoster  (shingles) pain Pain from compression fractures  Preparation for the injection:  Do not eat any solid food or dairy products within 8 hours of your appointment.  You may drink clear liquids up to 3 hours before appointment.  Clear liquids include water, black coffee, juice or soda.  No milk or cream please. You may take your regular medication, including pain medications, with a sip of water before your appointment  Diabetics should hold regular insulin (if taken separately) and take 1/2 normal NPH dos the morning of the procedure.  Carry some sugar containing items with you to your appointment. A driver must accompany you and be prepared to drive you home after your procedure.  Bring all your current medications with your. An IV may be inserted and sedation may be given at the discretion of the physician.   A blood pressure cuff, EKG and other monitors will often be applied during the procedure.  Some patients may need to have extra oxygen administered for a short period. You will be asked to provide medical information, including your allergies, prior to the procedure.  We must know immediately if you are taking blood thinners (like Coumadin/Warfarin)  Or if you are allergic to IV iodine contrast (dye). We must know if you could possible be pregnant.  Possible side-effects: Bleeding from needle site Infection (rare, may require surgery) Nerve injury (rare) Numbness & tingling (temporary) Difficulty urinating (rare, temporary) Spinal headache ( a headache worse with upright posture) Light -headedness (temporary) Pain at injection site (several days) Decreased blood pressure (temporary) Weakness in arm/leg (temporary) Pressure sensation in back/neck (temporary)  Call if you experience: Fever/chills associated with headache or increased back/neck pain.   Headache worsened by an upright position. New onset weakness or numbness of an extremity below the injection site Hives or difficulty  breathing (go to the emergency room) Inflammation or drainage at the infection site Severe back/neck pain Any new symptoms which are concerning to you  Please note:  Although the local anesthetic injected can often make your back or neck feel good for several hours after the injection, the pain will likely return.  It takes 3-7 days for steroids to work in the epidural space.  You may not notice any pain relief for at least that one week.  If effective, we will often do a series of three injections spaced 3-6 weeks apart to maximally decrease your pain.  After the initial series, we generally will wait several months before considering a repeat injection of the same type.  If you have any questions, please call [phone] [facility] 

## 2021-06-28 ENCOUNTER — Ambulatory Visit
Admission: RE | Admit: 2021-06-28 | Discharge: 2021-06-28 | Disposition: A | Discharge status: Home / Self Care | Payer: Medicare Other | Source: Ambulatory Visit | Attending: Student in an Organized Health Care Education/Training Program | Admitting: Student in an Organized Health Care Education/Training Program

## 2021-06-28 ENCOUNTER — Other Ambulatory Visit: Payer: Self-pay

## 2021-06-28 DIAGNOSIS — M5417 Radiculopathy, lumbosacral region: Secondary | ICD-10-CM

## 2021-06-28 DIAGNOSIS — G894 Chronic pain syndrome: Secondary | ICD-10-CM

## 2021-06-28 DIAGNOSIS — M5416 Radiculopathy, lumbar region: Secondary | ICD-10-CM

## 2021-07-01 ENCOUNTER — Ambulatory Visit: Payer: Medicare Other | Admitting: Student in an Organized Health Care Education/Training Program

## 2021-07-05 ENCOUNTER — Other Ambulatory Visit: Payer: Self-pay

## 2021-07-05 ENCOUNTER — Ambulatory Visit
Admission: RE | Admit: 2021-07-05 | Discharge: 2021-07-05 | Disposition: A | Discharge status: Home / Self Care | Payer: Medicare Other | Source: Ambulatory Visit | Attending: Student in an Organized Health Care Education/Training Program | Admitting: Student in an Organized Health Care Education/Training Program

## 2021-07-13 ENCOUNTER — Ambulatory Visit (HOSPITAL_BASED_OUTPATIENT_CLINIC_OR_DEPARTMENT_OTHER): Payer: Medicare Other | Admitting: Student in an Organized Health Care Education/Training Program

## 2021-07-13 ENCOUNTER — Ambulatory Visit
Admission: RE | Admit: 2021-07-13 | Discharge: 2021-07-13 | Disposition: A | Discharge status: Home / Self Care | Payer: Medicare Other | Source: Ambulatory Visit | Attending: Student in an Organized Health Care Education/Training Program | Admitting: Student in an Organized Health Care Education/Training Program

## 2021-07-13 ENCOUNTER — Encounter: Payer: Self-pay | Admitting: Student in an Organized Health Care Education/Training Program

## 2021-07-13 ENCOUNTER — Other Ambulatory Visit: Payer: Self-pay

## 2021-07-13 DIAGNOSIS — G894 Chronic pain syndrome: Secondary | ICD-10-CM | POA: Diagnosis not present

## 2021-07-13 DIAGNOSIS — M5416 Radiculopathy, lumbar region: Secondary | ICD-10-CM

## 2021-07-13 DIAGNOSIS — M5417 Radiculopathy, lumbosacral region: Secondary | ICD-10-CM | POA: Insufficient documentation

## 2021-07-13 MED ORDER — DEXAMETHASONE SODIUM PHOSPHATE 10 MG/ML IJ SOLN
10.0000 mg | Freq: Once | INTRAMUSCULAR | Status: AC
Start: 2021-07-13 — End: 2021-07-13
  Administered 2021-07-13: 10 mg

## 2021-07-13 MED ORDER — ROPIVACAINE HCL 2 MG/ML IJ SOLN
2.0000 mL | Freq: Once | INTRAMUSCULAR | Status: AC
  Administered 2021-07-13: 20 mL via EPIDURAL

## 2021-07-13 MED ORDER — IOHEXOL 180 MG/ML  SOLN
INTRAMUSCULAR | Status: AC
  Filled 2021-07-13: qty 20

## 2021-07-13 MED ORDER — IOHEXOL 180 MG/ML  SOLN
10.0000 mL | Freq: Once | INTRAMUSCULAR | Status: AC
Start: 2021-07-13 — End: 2021-07-13
  Administered 2021-07-13: 10 mL via EPIDURAL

## 2021-07-13 MED ORDER — ROPIVACAINE HCL 2 MG/ML IJ SOLN
INTRAMUSCULAR | Status: AC
  Filled 2021-07-13: qty 20

## 2021-07-13 MED ORDER — SODIUM CHLORIDE 0.9% FLUSH
2.0000 mL | Freq: Once | INTRAVENOUS | Status: AC
  Administered 2021-07-13: 10 mL

## 2021-07-13 MED ORDER — DEXAMETHASONE SODIUM PHOSPHATE 10 MG/ML IJ SOLN
INTRAMUSCULAR | Status: AC
  Filled 2021-07-13: qty 1

## 2021-07-13 MED ORDER — SODIUM CHLORIDE (PF) 0.9 % IJ SOLN
INTRAMUSCULAR | Status: AC
  Filled 2021-07-13: qty 10

## 2021-07-13 MED ORDER — LIDOCAINE HCL (PF) 2 % IJ SOLN
INTRAMUSCULAR | Status: AC
  Filled 2021-07-13: qty 5

## 2021-07-13 MED ORDER — LIDOCAINE HCL 2 % IJ SOLN
20.0000 mL | Freq: Once | INTRAMUSCULAR | Status: AC
Start: 2021-07-13 — End: 2021-07-13
  Administered 2021-07-13: 100 mg

## 2021-07-13 NOTE — Patient Instructions (Signed)

## 2021-07-13 NOTE — Progress Notes (Signed)
PROVIDER NOTE: Information contained herein reflects review and annotations entered in association with encounter. Interpretation of such information and data should be left to medically-trained personnel. Information provided to patient can be located elsewhere in the medical record under "Patient Instructions". Document created using STT-dictation technology, any transcriptional errors that may result from process are unintentional.    Patient: Cassandra Brown  Service Category: Procedure  Provider: Gillis Santa, MD  DOB: 09/14/1966  DOS: 07/13/2021  Location: [facility]  MRN: [id number]  Setting: Ambulatory - outpatient  Referring Provider: Gillis Santa, MD  Type: Established Patient  Specialty: Interventional Pain Management  PCP: Baxter Hire, MD   Primary Reason for Visit: Interventional Pain Management Treatment. CC: Back Pain (low)  Procedure:          Anesthesia, Analgesia, Anxiolysis:  Type: Therapeutic Inter-Laminar Epidural Steroid Injection           Region: Lumbar Level: L4-5 Level. Laterality: Left-Sided          Local Anesthetic: Lidocaine 1-2%  Position: Prone with head of the table was raised to facilitate breathing.   Indications: 1. Lumbar radiculopathy, chronic (LEFT)    2. Lumbosacral radiculopathy at L5 (left)   3. Chronic pain syndrome   4. Lumbar radiculopathy, chronic   5. Lumbosacral radiculopathy at L5    Pain Score: Pre-procedure: 8 /10 Post-procedure: 8 /10   Pre-op H&P Assessment:  Cassandra Brown is a 55 y.o. (year old), female patient, seen today for interventional treatment. She  has a past surgical history that includes Joint replacement (Right); Knee surgery (Left); Shoulder surgery (Bilateral); Appendectomy; Cholecystectomy; Thoracic laminectomy for spinal cord stimulator (Bilateral, 06/23/2020); Pulse generator implant (Left, 06/23/2020); Video bronchoscopy with endobronchial ultrasound (N/A, 02/27/2021); Video bronchoscopy with  endobronchial navigation (N/A, 02/27/2021); and Nasal hemorrhage control (N/A, 05/13/2021). Cassandra Brown has a current medication list which includes the following prescription(s): aspirin ec, bac, carisoprodol, cholecalciferol, clobetasol cream, dupixent, emgality, trelegy ellipta, folic acid, furosemide, gentamicin ointment, simponi, hydrocodone-acetaminophen, hydrocodone-acetaminophen, methotrexate, montelukast, naloxone, nurtec, pantoprazole, pentoxifylline, pimecrolimus, prednisone, pregabalin, promethazine, propranolol er, rosuvastatin, sodium chloride, sotalol, centrum performance, sucralfate, trelegy ellipta, and triamcinolone cream. Her primarily concern today is the Back Pain (low)  Initial Vital Signs:  Pulse/HCG Rate: 77ECG Heart Rate: 71 Temp: 98 F (36.7 C) Resp: 18 BP: (!) 149/69 SpO2: 94 %  BMI: Estimated body mass index is 38.78 kg/m as calculated from the following:   Height as of this encounter: 5\' 2"  (1.575 m).   Weight as of this encounter: 212 lb (96.2 kg).  Risk Assessment: Allergies: Reviewed. She is allergic to cyclobenzaprine, ibuprofen, morphine, meperidine, sulfa antibiotics, and onion.  Allergy Precautions: None required Coagulopathies: Reviewed. None identified.  Blood-thinner therapy: None at this time Active Infection(s): Reviewed. None identified. Cassandra Brown is afebrile  Site Confirmation: Cassandra Brown was asked to confirm the procedure and laterality before marking the site Procedure checklist: Completed Consent: Before the procedure and under the influence of no sedative(s), amnesic(s), or anxiolytics, the patient was informed of the treatment options, risks and possible complications. To fulfill our ethical and legal obligations, as recommended by the American Medical Association's Code of Ethics, I have informed the patient of my clinical impression; the nature and purpose of the treatment or procedure; the risks, benefits, and possible complications of the  intervention; the alternatives, including doing nothing; the risk(s) and benefit(s) of the alternative treatment(s) or procedure(s); and the risk(s) and benefit(s) of doing nothing. The patient was provided information about the  general risks and possible complications associated with the procedure. These may include, but are not limited to: failure to achieve desired goals, infection, bleeding, organ or nerve damage, allergic reactions, paralysis, and death. In addition, the patient was informed of those risks and complications associated to Spine-related procedures, such as failure to decrease pain; infection (i.e.: Meningitis, epidural or intraspinal abscess); bleeding (i.e.: epidural hematoma, subarachnoid hemorrhage, or any other type of intraspinal or peri-dural bleeding); organ or nerve damage (i.e.: Any type of peripheral nerve, nerve root, or spinal cord injury) with subsequent damage to sensory, motor, and/or autonomic systems, resulting in permanent pain, numbness, and/or weakness of one or several areas of the body; allergic reactions; (i.e.: anaphylactic reaction); and/or death. Furthermore, the patient was informed of those risks and complications associated with the medications. These include, but are not limited to: allergic reactions (i.e.: anaphylactic or anaphylactoid reaction(s)); adrenal axis suppression; blood sugar elevation that in diabetics may result in ketoacidosis or comma; water retention that in patients with history of congestive heart failure may result in shortness of breath, pulmonary edema, and decompensation with resultant heart failure; weight gain; swelling or edema; medication-induced neural toxicity; particulate matter embolism and blood vessel occlusion with resultant organ, and/or nervous system infarction; and/or aseptic necrosis of one or more joints. Finally, the patient was informed that Medicine is not an exact science; therefore, there is also the possibility of  unforeseen or unpredictable risks and/or possible complications that may result in a catastrophic outcome. The patient indicated having understood very clearly. We have given the patient no guarantees and we have made no promises. Enough time was given to the patient to ask questions, all of which were answered to the patient's satisfaction. Cassandra Brown has indicated that she wanted to continue with the procedure. Attestation: I, the ordering provider, attest that I have discussed with the patient the benefits, risks, side-effects, alternatives, likelihood of achieving goals, and potential problems during recovery for the procedure that I have provided informed consent. Date  Time: 07/13/2021  1:10 PM  Pre-Procedure Preparation:  Monitoring: As per clinic protocol. Respiration, ETCO2, SpO2, BP, heart rate and rhythm monitor placed and checked for adequate function Safety Precautions: Patient was assessed for positional comfort and pressure points before starting the procedure. Time-out: I initiated and conducted the "Time-out" before starting the procedure, as per protocol. The patient was asked to participate by confirming the accuracy of the "Time Out" information. Verification of the correct person, site, and procedure were performed and confirmed by me, the nursing staff, and the patient. "Time-out" conducted as per Joint Commission's Universal Protocol (UP.01.01.01). Time: 1341  Description of Procedure:          Target Area: The interlaminar space, initially targeting the lower laminar border of the superior vertebral body. Approach: Paramedial approach. Area Prepped: Entire Posterior Lumbar Region DuraPrep (Iodine Povacrylex [0.7% available iodine] and Isopropyl Alcohol, 74% w/w) Safety Precautions: Aspiration looking for blood return was conducted prior to all injections. At no point did we inject any substances, as a needle was being advanced. No attempts were made at seeking any paresthesias.  Safe injection practices and needle disposal techniques used. Medications properly checked for expiration dates. SDV (single dose vial) medications used. Description of the Procedure: Protocol guidelines were followed. The procedure needle was introduced through the skin, ipsilateral to the reported pain, and advanced to the target area. Bone was contacted and the needle walked caudad, until the lamina was cleared. The epidural space was identified using "loss-of-resistance  technique" with 2-3 ml of PF-NaCl (0.9% NSS), in a 5cc LOR glass syringe.  Vitals:   07/13/21 1315 07/13/21 1344 07/13/21 1346  BP: (!) 149/69  (!) 152/73  Pulse: 77    Resp: 18 20   Temp: 98 F (36.7 C)    SpO2: 94% 97%   Weight: 212 lb (96.2 kg)    Height: 5\' 2"  (1.575 m)      Start Time: 1342 hrs. End Time: 1346 hrs.  Materials:  Needle(s) Type: Epidural needle Gauge: 22G Length: 3.5-in Medication(s): Please see orders for medications and dosing details. 6 cc solution made of 3 cc of preservative-free saline, 2 cc of 0.2% ropivacaine, 1 cc of Decadron 10 mg/cc.  Imaging Guidance (Spinal):          Type of Imaging Technique: Fluoroscopy Guidance (Spinal) Indication(s): Assistance in needle guidance and placement for procedures requiring needle placement in or near specific anatomical locations not easily accessible without such assistance. Exposure Time: Please see nurses notes. Contrast: Before injecting any contrast, we confirmed that the patient did not have an allergy to iodine, shellfish, or radiological contrast. Once satisfactory needle placement was completed at the desired level, radiological contrast was injected. Contrast injected under live fluoroscopy. No contrast complications. See chart for type and volume of contrast used. Fluoroscopic Guidance: I was personally present during the use of fluoroscopy. "Tunnel Vision Technique" used to obtain the best possible view of the target area. Parallax error  corrected before commencing the procedure. "Direction-depth-direction" technique used to introduce the needle under continuous pulsed fluoroscopy. Once target was reached, antero-posterior, oblique, and lateral fluoroscopic projection used confirm needle placement in all planes. Images permanently stored in EMR. Interpretation: I personally interpreted the imaging intraoperatively. Adequate needle placement confirmed in multiple planes. Appropriate spread of contrast into desired area was observed. No evidence of afferent or efferent intravascular uptake. No intrathecal or subarachnoid spread observed. Permanent images saved into the patient's record.  Post-operative Assessment:  Post-procedure Vital Signs:  Pulse/HCG Rate: 7771 Temp: 98 F (36.7 C) Resp: 20 BP: (!) 152/73 SpO2: 97 %  EBL: None  Complications: No immediate post-treatment complications observed by team, or reported by patient.  Note: The patient tolerated the entire procedure well. A repeat set of vitals were taken after the procedure and the patient was kept under observation following institutional policy, for this type of procedure. Post-procedural neurological assessment was performed, showing return to baseline, prior to discharge. The patient was provided with post-procedure discharge instructions, including a section on how to identify potential problems. Should any problems arise concerning this procedure, the patient was given instructions to immediately contact us, at any time, without hesitation. In any case, we plan to contact the patient by telephone for a follow-up status report regarding this interventional procedure.  Comments:  No additional relevant information.  Plan of Care  Orders:  Orders Placed This Encounter  Procedures   DG PAIN [facility] C-ARM 1-60 MIN NO REPORT    Intraoperative interpretation by procedural physician at [facility].    Standing Status:   Standing    Number of Occurrences:    1    Order Specific Question:   Reason for exam:    Answer:   Assistance in needle guidance and placement for procedures requiring needle placement in or near specific anatomical locations not easily accessible without such assistance.    Medications ordered for procedure: Meds ordered this encounter  Medications   iohexol (OMNIPAQUE) 180 MG/ML injection 10 mL  Must be Myelogram-compatible. If not available, you may substitute with a water-soluble, non-ionic, hypoallergenic, myelogram-compatible radiological contrast medium.   lidocaine (XYLOCAINE) 2 % (with pres) injection 400 mg   ropivacaine (PF) 2 mg/mL (0.2%) (NAROPIN) injection 2 mL   sodium chloride flush (NS) 0.9 % injection 2 mL   dexamethasone (DECADRON) injection 10 mg   Medications administered: We administered iohexol, lidocaine, ropivacaine (PF) 2 mg/mL (0.2%), sodium chloride flush, and dexamethasone.  See the medical record for exact dosing, route, and time of administration.  Follow-up plan:   Return in about 2 weeks (around 07/27/2021) for Post Procedure Evaluation, in person.      Left L4/5 ESI #1 07/13/21  Recent Visits Date Type Provider Dept  06/22/21 Office Visit Gillis Santa, MD [facility]  Showing recent visits within past 90 days and meeting all other requirements Today's Visits Date Type Provider Dept  07/13/21 Procedure visit Gillis Santa, MD [facility]  Showing today's visits and meeting all other requirements Future Appointments Date Type Provider Dept  07/30/21 Appointment Gillis Santa, MD [facility]  Showing future appointments within next 90 days and meeting all other requirements Disposition: Discharge home  Discharge (Date  Time): 07/13/2021; 1355 hrs.   Primary Care Physician: Baxter Hire, MD Location: [facility] Note by: Gillis Santa, MD Date: 07/13/2021; Time: 2:53 PM  Disclaimer:  Medicine is not an exact science.  The only guarantee in medicine is that nothing is guaranteed. It is important to note that the decision to proceed with this intervention was based on the information collected from the patient. The Data and conclusions were drawn from the patient's questionnaire, the interview, and the physical examination. Because the information was provided in large part by the patient, it cannot be guaranteed that it has not been purposely or unconsciously manipulated. Every effort has been made to obtain as much relevant data as possible for this evaluation. It is important to note that the conclusions that lead to this procedure are derived in large part from the available data. Always take into account that the treatment will also be dependent on availability of resources and existing treatment guidelines, considered by other Pain Management Practitioners as being common knowledge and practice, at the time of the intervention. For Medico-Legal purposes, it is also important to point out that variation in procedural techniques and pharmacological choices are the acceptable norm. The indications, contraindications, technique, and results of the above procedure should only be interpreted and judged by a Board-Certified Interventional Pain Specialist with extensive familiarity and expertise in the same exact procedure and technique.

## 2021-07-14 ENCOUNTER — Telehealth: Payer: Self-pay

## 2021-07-14 NOTE — Telephone Encounter (Signed)
PosT procedure phone call.  LM

## 2021-07-30 ENCOUNTER — Ambulatory Visit
Payer: Medicare Other | Attending: Student in an Organized Health Care Education/Training Program | Admitting: Student in an Organized Health Care Education/Training Program

## 2021-07-30 ENCOUNTER — Encounter: Payer: Self-pay | Admitting: Student in an Organized Health Care Education/Training Program

## 2021-07-30 ENCOUNTER — Other Ambulatory Visit: Payer: Self-pay

## 2021-07-30 DIAGNOSIS — G894 Chronic pain syndrome: Secondary | ICD-10-CM

## 2021-07-30 DIAGNOSIS — G8929 Other chronic pain: Secondary | ICD-10-CM | POA: Diagnosis not present

## 2021-07-30 DIAGNOSIS — M5416 Radiculopathy, lumbar region: Secondary | ICD-10-CM

## 2021-07-30 DIAGNOSIS — M5417 Radiculopathy, lumbosacral region: Secondary | ICD-10-CM

## 2021-07-30 NOTE — Progress Notes (Signed)
Patient: Cassandra Brown  Service Category: E/M  Provider: Gillis Santa, MD  DOB: 08-11-1966  DOS: 07/30/2021  Location: Office  MRN: [id number]  Setting: Ambulatory outpatient  Referring Provider: Baxter Hire, MD  Type: Established Patient  Specialty: Interventional Pain Management  PCP: Baxter Hire, MD  Location: Remote location  Delivery: TeleHealth     Virtual Encounter - Pain Management PROVIDER NOTE: Information contained herein reflects review and annotations entered in association with encounter. Interpretation of such information and data should be left to medically-trained personnel. Information provided to patient can be located elsewhere in the medical record under "Patient Instructions". Document created using STT-dictation technology, any transcriptional errors that may result from process are unintentional.    Contact & Pharmacy Preferred: [phone] Home: [phone] (home) Mobile: [phone] (mobile) E-mail: [email or web address], [place] - [place] [address] Phone: [phone] Fax: [phone]   Pre-screening  Ms. Valere Dross offered "in-person" vs "virtual" encounter. She indicated preferring virtual for this encounter.   Reason COVID-19*  Social distancing based on CDC and AMA recommendations.   I contacted Cassandra Brown on 07/30/2021 via video conference.      I clearly identified myself as Gillis Santa, MD. I verified that I was speaking with the correct person using two identifiers (Name: SHAWNEE GAMBONE, and date of birth: October 24, 1966).  Consent I sought verbal advanced consent from Cassandra Brown for virtual visit interactions. I informed Ms. Shelden of possible security and privacy concerns, risks, and limitations associated with providing "not-in-person" medical evaluation and management services. I also informed Ms. Wojtas of the availability of "in-person" appointments. Finally, I  informed her that there would be a charge for the virtual visit and that she could be  personally, fully or partially, financially responsible for it. Ms. Schuler expressed understanding and agreed to proceed.   Historic Elements   Ms. JOHNYE KIST is a 55 y.o. year old, female patient evaluated today after our last contact on 07/13/2021. Ms. Hofmeister  has a past medical history of Allergic rhinitis due to pollen, Angina pectoris ([place]), CHF (congestive heart failure) ([place]), Chronic maxillary sinusitis, Chronic pain syndrome, Chronic right sacroiliac joint pain, Collagen vascular disease ([place]), Coronary artery abnormality, Dysrhythmia, Gastroparesis, GERD (gastroesophageal reflux disease), History of kidney stones, Leukocytosis, Obesity, Patent foramen ovale, Rheumatoid arthritis ([place]), Sleep apnea, Spinal stenosis of lumbar region, Unequal leg length, Vasculitis ([place]), and Wegener's disease, pulmonary. She also  has a past surgical history that includes Joint replacement (Right); Knee surgery (Left); Shoulder surgery (Bilateral); Appendectomy; Cholecystectomy; Thoracic laminectomy for spinal cord stimulator (Bilateral, 06/23/2020); Pulse generator implant (Left, 06/23/2020); Video bronchoscopy with endobronchial ultrasound (N/A, 02/27/2021); Video bronchoscopy with endobronchial navigation (N/A, 02/27/2021); and Nasal hemorrhage control (N/A, 05/13/2021). Ms. Lobello has a current medication list which includes the following prescription(s): aspirin ec, bac, carisoprodol, cholecalciferol, clobetasol cream, dupixent, emgality, trelegy ellipta, folic acid, furosemide, gentamicin ointment, simponi, hydrocodone-acetaminophen, hydrocodone-acetaminophen, methotrexate, montelukast, naloxone, nurtec, pantoprazole, pentoxifylline, pimecrolimus, prednisone, pregabalin, promethazine, propranolol er, rosuvastatin, sodium chloride, sotalol, centrum performance, sucralfate, trelegy ellipta, and triamcinolone cream. She  reports that  she quit smoking about 10 years ago. Her smoking use included cigarettes. She has a 99.00 pack-year smoking history. She has never used smokeless tobacco. She reports previous alcohol use. She reports that she does not use drugs. Ms. Zarazua is allergic to cyclobenzaprine, ibuprofen, morphine, meperidine, sulfa antibiotics, and onion.   HPI  Today, she is being contacted for a post-procedure  assessment.  Post-Procedure Evaluation  Procedure (07/13/2021):   Type: Therapeutic Inter-Laminar Epidural Steroid Injection           Region: Lumbar Level: L4-5 Level. Laterality: Left-Sided          Local anesthetic used: Long-acting (4-6 hours) Effectiveness: Defined as any analgesic benefit obtained secondary to the administration of local anesthetics. This carries significant diagnostic value as to the etiological location, or anatomical origin, of the pain. Duration of benefit is expected to coincide with the duration of the local anesthetic used.  Effectiveness during initial 4-6 hours after procedure (Short-Term Relief): 75%  Long-term benefit: Defined as any relief past the pharmacologic duration of the local anesthetics.  Effectiveness past the initial 6 hours after procedure (Long-Term Relief): 60%  Benefits, current: Defined as benefit present at the time of this evaluation.   Analgesia:  <50% better Function: Back to baseline ROM: Back to baseline  Recommend repeating left L4-L5 ESI #2 Referral to physical therapy   Laboratory Chemistry Profile   Renal Lab Results  Component Value Date   BUN 23 (H) 05/14/2021   CREATININE 0.77 05/14/2021   GFRAA >60 06/19/2020   GFRNONAA >60 05/14/2021    Hepatic No results found for: AST, ALT, ALBUMIN, ALKPHOS, HCVAB, AMYLASE, LIPASE, AMMONIA  Electrolytes Lab Results  Component Value Date   NA 143 05/14/2021   K 4.7 05/14/2021   CL 108 05/14/2021   CALCIUM 9.1 05/14/2021   MG 2.1 05/14/2021    Bone No results found for: VD25OH,  VD125OH2TOT, BE0100FH2, RF7588TG5, 25OHVITD1, 25OHVITD2, 25OHVITD3, TESTOFREE, TESTOSTERONE  Inflammation (CRP: Acute Phase) (ESR: Chronic Phase) No results found for: CRP, ESRSEDRATE, LATICACIDVEN       Note: Above Lab results reviewed.   Assessment  The primary encounter diagnosis was Lumbar radiculopathy, chronic (LEFT) . Diagnoses of Lumbosacral radiculopathy at L5 (left), Chronic pain syndrome, and Chronic radicular lumbar pain were also pertinent to this visit.  Plan of Care   1. Lumbar radiculopathy, chronic (LEFT)  - Lumbar Epidural Injection; Future - Ambulatory referral to Physical Therapy  2. Lumbosacral radiculopathy at L5 (left) - Lumbar Epidural Injection; Future - Ambulatory referral to Physical Therapy  3. Chronic pain syndrome - Lumbar Epidural Injection; Future - Ambulatory referral to Physical Therapy  4. Chronic radicular lumbar pain - Lumbar Epidural Injection; Future - Ambulatory referral to Physical Therapy   Orders:  Orders Placed This Encounter  Procedures   Lumbar Epidural Injection    Standing Status:   Future    Standing Expiration Date:   08/30/2021    Scheduling Instructions:     Procedure: Interlaminar Lumbar Epidural Steroid injection (LESI)            Laterality: Midline     Sedation: without     Timeframe: ASAA    Order Specific Question:   Where will this procedure be performed?    Answer:   [facility]   Ambulatory referral to Physical Therapy    Referral Priority:   Routine    Referral Type:   Physical Medicine    Referral Reason:   Specialty Services Required    Requested Specialty:   Physical Therapy    Number of Visits Requested:   1   Follow-up plan:   Return in about 1 week (around 08/06/2021) for L4/5 ESI , without sedation.     Left L4/5 ESI #1 07/13/21   Recent Visits Date Type Provider Dept  07/13/21 Procedure visit Gillis Santa, MD [facility]  06/22/21 Office Visit Gillis Santa, MD [facility]  Showing recent visits within past 90 days and meeting all other requirements Today's Visits Date Type Provider Dept  07/30/21 Telemedicine Gillis Santa, MD [facility]  Showing today's visits and meeting all other requirements Future Appointments No visits were found meeting these conditions. Showing future appointments within next 90 days and meeting all other requirements I discussed the assessment and treatment plan with the patient. The patient was provided an opportunity to ask questions and all were answered. The patient agreed with the plan and demonstrated an understanding of the instructions.  Patient advised to call back or seek an in-person evaluation if the symptoms or condition worsens.  Duration of encounter: 19mnutes.  Note by: BGillis Santa MD Date: 07/30/2021; Time: 9:01 AM

## 2021-07-30 NOTE — Patient Instructions (Signed)

## 2021-08-05 ENCOUNTER — Encounter: Payer: Self-pay | Admitting: Student in an Organized Health Care Education/Training Program

## 2021-08-05 ENCOUNTER — Ambulatory Visit
Admission: RE | Admit: 2021-08-05 | Discharge: 2021-08-05 | Disposition: A | Discharge status: Home / Self Care | Payer: Medicare Other | Source: Ambulatory Visit | Attending: Student in an Organized Health Care Education/Training Program | Admitting: Student in an Organized Health Care Education/Training Program

## 2021-08-05 ENCOUNTER — Other Ambulatory Visit: Payer: Self-pay

## 2021-08-05 ENCOUNTER — Ambulatory Visit (HOSPITAL_BASED_OUTPATIENT_CLINIC_OR_DEPARTMENT_OTHER): Payer: Medicare Other | Admitting: Student in an Organized Health Care Education/Training Program

## 2021-08-05 VITALS — BP 130/82 | HR 63 | Temp 97.0°F | Resp 15 | Ht 62.0 in | Wt 206.0 lb

## 2021-08-05 DIAGNOSIS — M5416 Radiculopathy, lumbar region: Secondary | ICD-10-CM | POA: Diagnosis not present

## 2021-08-05 DIAGNOSIS — G894 Chronic pain syndrome: Secondary | ICD-10-CM | POA: Diagnosis present

## 2021-08-05 DIAGNOSIS — M5417 Radiculopathy, lumbosacral region: Secondary | ICD-10-CM

## 2021-08-05 MED ORDER — SODIUM CHLORIDE 0.9% FLUSH
2.0000 mL | Freq: Once | INTRAVENOUS | Status: AC
  Administered 2021-08-05: 2 mL

## 2021-08-05 MED ORDER — IOHEXOL 180 MG/ML  SOLN
INTRAMUSCULAR | Status: AC
  Filled 2021-08-05: qty 20

## 2021-08-05 MED ORDER — LIDOCAINE HCL (PF) 2 % IJ SOLN
INTRAMUSCULAR | Status: AC
  Filled 2021-08-05: qty 10

## 2021-08-05 MED ORDER — DEXAMETHASONE SODIUM PHOSPHATE 10 MG/ML IJ SOLN
INTRAMUSCULAR | Status: AC
  Filled 2021-08-05: qty 1

## 2021-08-05 MED ORDER — DEXAMETHASONE SODIUM PHOSPHATE 10 MG/ML IJ SOLN
10.0000 mg | Freq: Once | INTRAMUSCULAR | Status: AC
  Administered 2021-08-05: 10 mg

## 2021-08-05 MED ORDER — IOHEXOL 180 MG/ML  SOLN
10.0000 mL | Freq: Once | INTRAMUSCULAR | Status: AC
  Administered 2021-08-05: 10 mL via EPIDURAL

## 2021-08-05 MED ORDER — SODIUM CHLORIDE (PF) 0.9 % IJ SOLN
INTRAMUSCULAR | Status: AC
  Filled 2021-08-05: qty 10

## 2021-08-05 MED ORDER — LIDOCAINE HCL 2 % IJ SOLN
20.0000 mL | Freq: Once | INTRAMUSCULAR | Status: AC
  Administered 2021-08-05: 200 mg

## 2021-08-05 MED ORDER — ROPIVACAINE HCL 2 MG/ML IJ SOLN
INTRAMUSCULAR | Status: AC
  Filled 2021-08-05: qty 20

## 2021-08-05 MED ORDER — ROPIVACAINE HCL 2 MG/ML IJ SOLN
2.0000 mL | Freq: Once | INTRAMUSCULAR | Status: AC
  Administered 2021-08-05: 2 mL via EPIDURAL

## 2021-08-05 NOTE — Progress Notes (Signed)
Safety precautions to be maintained throughout the outpatient stay will include: orient to surroundings, keep bed in low position, maintain call bell within reach at all times, provide assistance with transfer out of bed and ambulation.  

## 2021-08-05 NOTE — Progress Notes (Signed)
PROVIDER NOTE: Information contained herein reflects review and annotations entered in association with encounter. Interpretation of such information and data should be left to medically-trained personnel. Information provided to patient can be located elsewhere in the medical record under "Patient Instructions". Document created using STT-dictation technology, any transcriptional errors that may result from process are unintentional.    Patient: Cassandra Brown  Service Category: Procedure  Provider: Gillis Santa, MD  DOB: 02-Aug-1966  DOS: 08/05/2021  Location: [facility]  MRN: [id number]  Setting: Ambulatory - outpatient  Referring Provider: Baxter Hire, MD  Type: Established Patient  Specialty: Interventional Pain Management  PCP: Baxter Hire, MD   Primary Reason for Visit: Interventional Pain Management Treatment. CC: Back Pain (Left lower)  Procedure:          Anesthesia, Analgesia, Anxiolysis:  Type: Therapeutic Inter-Laminar Epidural Steroid Injection           Region: Lumbar Level: L4-5 Level. Laterality: Left-Sided          Local Anesthetic: Lidocaine 1-2%  Position: Prone with head of the table was raised to facilitate breathing.   Indications: 1. Lumbar radiculopathy, chronic (LEFT)    2. Lumbosacral radiculopathy at L5 (left)   3. Chronic pain syndrome     Pain Score: Pre-procedure: 8 /10 Post-procedure: 7  (buttocks left going down to thigh)/10   Pre-op H&P Assessment:  Cassandra Brown is a 55 y.o. (year old), female patient, seen today for interventional treatment. She  has a past surgical history that includes Joint replacement (Right); Knee surgery (Left); Shoulder surgery (Bilateral); Appendectomy; Cholecystectomy; Thoracic laminectomy for spinal cord stimulator (Bilateral, 06/23/2020); Pulse generator implant (Left, 06/23/2020); Video bronchoscopy with endobronchial ultrasound (N/A, 02/27/2021); Video bronchoscopy with endobronchial navigation (N/A,  02/27/2021); and Nasal hemorrhage control (N/A, 05/13/2021). Cassandra Brown has a current medication list which includes the following prescription(s): aspirin ec, bac, carisoprodol, cholecalciferol, clobetasol cream, dupixent, emgality, trelegy ellipta, folic acid, furosemide, gentamicin ointment, hydrocodone-acetaminophen, hydrocodone-acetaminophen, methotrexate, montelukast, naloxone, nurtec, pantoprazole, pentoxifylline, pimecrolimus, prednisone, pregabalin, promethazine, propranolol er, rosuvastatin, sodium chloride, sotalol, centrum performance, sucralfate, trelegy ellipta, triamcinolone cream, and simponi. Her primarily concern today is the Back Pain (Left lower)  Initial Vital Signs:  Pulse/HCG Rate: 67  Temp: (!) 97 F (36.1 C) Resp: 15 BP: 127/73 SpO2: 97 %  BMI: Estimated body mass index is 37.68 kg/m as calculated from the following:   Height as of this encounter: '5\' 2"'$  (1.575 m).   Weight as of this encounter: 206 lb (93.4 kg).  Risk Assessment: Allergies: Reviewed. She is allergic to cyclobenzaprine, ibuprofen, morphine, meperidine, sulfa antibiotics, and onion.  Allergy Precautions: None required Coagulopathies: Reviewed. None identified.  Blood-thinner therapy: None at this time Active Infection(s): Reviewed. None identified. Cassandra Brown is afebrile  Site Confirmation: Cassandra Brown was asked to confirm the procedure and laterality before marking the site Procedure checklist: Completed Consent: Before the procedure and under the influence of no sedative(s), amnesic(s), or anxiolytics, the patient was informed of the treatment options, risks and possible complications. To fulfill our ethical and legal obligations, as recommended by the American Medical Association's Code of Ethics, I have informed the patient of my clinical impression; the nature and purpose of the treatment or procedure; the risks, benefits, and possible complications of the intervention; the alternatives, including  doing nothing; the risk(s) and benefit(s) of the alternative treatment(s) or procedure(s); and the risk(s) and benefit(s) of doing nothing. The patient was provided information about the general risks and possible complications  associated with the procedure. These may include, but are not limited to: failure to achieve desired goals, infection, bleeding, organ or nerve damage, allergic reactions, paralysis, and death. In addition, the patient was informed of those risks and complications associated to Spine-related procedures, such as failure to decrease pain; infection (i.e.: Meningitis, epidural or intraspinal abscess); bleeding (i.e.: epidural hematoma, subarachnoid hemorrhage, or any other type of intraspinal or peri-dural bleeding); organ or nerve damage (i.e.: Any type of peripheral nerve, nerve root, or spinal cord injury) with subsequent damage to sensory, motor, and/or autonomic systems, resulting in permanent pain, numbness, and/or weakness of one or several areas of the body; allergic reactions; (i.e.: anaphylactic reaction); and/or death. Furthermore, the patient was informed of those risks and complications associated with the medications. These include, but are not limited to: allergic reactions (i.e.: anaphylactic or anaphylactoid reaction(s)); adrenal axis suppression; blood sugar elevation that in diabetics may result in ketoacidosis or comma; water retention that in patients with history of congestive heart failure may result in shortness of breath, pulmonary edema, and decompensation with resultant heart failure; weight gain; swelling or edema; medication-induced neural toxicity; particulate matter embolism and blood vessel occlusion with resultant organ, and/or nervous system infarction; and/or aseptic necrosis of one or more joints. Finally, the patient was informed that Medicine is not an exact science; therefore, there is also the possibility of unforeseen or unpredictable risks and/or  possible complications that may result in a catastrophic outcome. The patient indicated having understood very clearly. We have given the patient no guarantees and we have made no promises. Enough time was given to the patient to ask questions, all of which were answered to the patient's satisfaction. Ms. Greiff has indicated that she wanted to continue with the procedure. Attestation: I, the ordering provider, attest that I have discussed with the patient the benefits, risks, side-effects, alternatives, likelihood of achieving goals, and potential problems during recovery for the procedure that I have provided informed consent. Date  Time: 08/05/2021 10:54 AM  Pre-Procedure Preparation:  Monitoring: As per clinic protocol. Respiration, ETCO2, SpO2, BP, heart rate and rhythm monitor placed and checked for adequate function Safety Precautions: Patient was assessed for positional comfort and pressure points before starting the procedure. Time-out: I initiated and conducted the "Time-out" before starting the procedure, as per protocol. The patient was asked to participate by confirming the accuracy of the "Time Out" information. Verification of the correct person, site, and procedure were performed and confirmed by me, the nursing staff, and the patient. "Time-out" conducted as per Joint Commission's Universal Protocol (UP.01.01.01). Time: 1130  Description of Procedure:          Target Area: The interlaminar space, initially targeting the lower laminar border of the superior vertebral body. Approach: Paramedial approach. Area Prepped: Entire Posterior Lumbar Region DuraPrep (Iodine Povacrylex [0.7% available iodine] and Isopropyl Alcohol, 74% w/w) Safety Precautions: Aspiration looking for blood return was conducted prior to all injections. At no point did we inject any substances, as a needle was being advanced. No attempts were made at seeking any paresthesias. Safe injection practices and needle  disposal techniques used. Medications properly checked for expiration dates. SDV (single dose vial) medications used. Description of the Procedure: Protocol guidelines were followed. The procedure needle was introduced through the skin, ipsilateral to the reported pain, and advanced to the target area. Bone was contacted and the needle walked caudad, until the lamina was cleared. The epidural space was identified using "loss-of-resistance technique" with 2-3 ml of PF-NaCl (  0.9% NSS), in a 5cc LOR glass syringe.  Vitals:   08/05/21 1101 08/05/21 1123 08/05/21 1130 08/05/21 1135  BP: 127/73 138/80 127/84 130/82  Pulse: 67 70 61 63  Resp: '15 16 16 15  '$ Temp: (!) 97 F (36.1 C)     TempSrc: Temporal     SpO2: 97% 100% 98% 97%  Weight: 206 lb (93.4 kg)     Height: '5\' 2"'$  (1.575 m)       Start Time: 1130 hrs. End Time: 1133 hrs.  Materials:  Needle(s) Type: Epidural needle Gauge: 22G Length: 3.5-in Medication(s): Please see orders for medications and dosing details. 7cc solution made of 3 cc of preservative-free saline, 3 cc of 0.2% ropivacaine, 1 cc of Decadron 10 mg/cc.  Imaging Guidance (Spinal):          Type of Imaging Technique: Fluoroscopy Guidance (Spinal) Indication(s): Assistance in needle guidance and placement for procedures requiring needle placement in or near specific anatomical locations not easily accessible without such assistance. Exposure Time: Please see nurses notes. Contrast: Before injecting any contrast, we confirmed that the patient did not have an allergy to iodine, shellfish, or radiological contrast. Once satisfactory needle placement was completed at the desired level, radiological contrast was injected. Contrast injected under live fluoroscopy. No contrast complications. See chart for type and volume of contrast used. Fluoroscopic Guidance: I was personally present during the use of fluoroscopy. "Tunnel Vision Technique" used to obtain the best possible view of  the target area. Parallax error corrected before commencing the procedure. "Direction-depth-direction" technique used to introduce the needle under continuous pulsed fluoroscopy. Once target was reached, antero-posterior, oblique, and lateral fluoroscopic projection used confirm needle placement in all planes. Images permanently stored in EMR. Interpretation: I personally interpreted the imaging intraoperatively. Adequate needle placement confirmed in multiple planes. Appropriate spread of contrast into desired area was observed. No evidence of afferent or efferent intravascular uptake. No intrathecal or subarachnoid spread observed. Permanent images saved into the patient's record.  Post-operative Assessment:  Post-procedure Vital Signs:  Pulse/HCG Rate: 63 (nst)  Temp: (!) 97 F (36.1 C) Resp: 15 BP: 130/82 SpO2: 97 %  EBL: None  Complications: No immediate post-treatment complications observed by team, or reported by patient.  Note: The patient tolerated the entire procedure well. A repeat set of vitals were taken after the procedure and the patient was kept under observation following institutional policy, for this type of procedure. Post-procedural neurological assessment was performed, showing return to baseline, prior to discharge. The patient was provided with post-procedure discharge instructions, including a section on how to identify potential problems. Should any problems arise concerning this procedure, the patient was given instructions to immediately contact us, at any time, without hesitation. In any case, we plan to contact the patient by telephone for a follow-up status report regarding this interventional procedure.  Comments:  No additional relevant information.  Plan of Care  Orders:  Orders Placed This Encounter  Procedures   DG PAIN CLINIC C-ARM 1-60 MIN NO REPORT    Intraoperative interpretation by procedural physician at [place].    Standing Status:    Standing    Number of Occurrences:   1    Order Specific Question:   Reason for exam:    Answer:   Assistance in needle guidance and placement for procedures requiring needle placement in or near specific anatomical locations not easily accessible without such assistance.     Medications ordered for procedure: Meds ordered this encounter  Medications   iohexol (OMNIPAQUE) 180 MG/ML injection 10 mL    Must be Myelogram-compatible. If not available, you may substitute with a water-soluble, non-ionic, hypoallergenic, myelogram-compatible radiological contrast medium.   lidocaine (XYLOCAINE) 2 % (with pres) injection 400 mg   dexamethasone (DECADRON) injection 10 mg   ropivacaine (PF) 2 mg/mL (0.2%) (NAROPIN) injection 2 mL   sodium chloride flush (NS) 0.9 % injection 2 mL    Medications administered: We administered iohexol, lidocaine, dexamethasone, ropivacaine (PF) 2 mg/mL (0.2%), and sodium chloride flush.  See the medical record for exact dosing, route, and time of administration.  Follow-up plan:   Return in about 5 weeks (around 09/09/2021) for Post Procedure Evaluation, virtual.      Left L4/5 ESI #1 07/13/21

## 2021-08-06 ENCOUNTER — Telehealth: Payer: Self-pay

## 2021-08-06 NOTE — Telephone Encounter (Signed)
Post procedure phone call.  Patient states she is fine.

## 2021-08-12 ENCOUNTER — Ambulatory Visit
Payer: Medicare Other | Attending: Student in an Organized Health Care Education/Training Program | Admitting: Physical Therapy

## 2021-08-12 ENCOUNTER — Other Ambulatory Visit: Payer: Self-pay

## 2021-08-12 VITALS — BP 152/67 | HR 71

## 2021-08-12 DIAGNOSIS — M5442 Lumbago with sciatica, left side: Secondary | ICD-10-CM | POA: Diagnosis not present

## 2021-08-12 DIAGNOSIS — G8929 Other chronic pain: Secondary | ICD-10-CM | POA: Insufficient documentation

## 2021-08-12 NOTE — Therapy (Addendum)
[facility] [address], [place], [address] Phone: [phone]   Fax:  [phone]  Physical Therapy Evaluation  Patient Details  Name: Cassandra Brown MRN: [id number] Date of Birth: 12-Sep-1966 No data recorded  Encounter Date: 08/12/2021   PT End of Session - 08/12/21 2029     Visit Number 1    Number of Visits 11    Date for PT Re-Evaluation 09/16/21    PT Start Time 1500    PT Stop Time 1545    PT Time Calculation (min) 45 min    Equipment Utilized During Treatment Gait belt    Activity Tolerance Patient tolerated treatment well    Behavior During Therapy WFL for tasks assessed/performed             Past Medical History:  Diagnosis Date   Allergic rhinitis due to pollen    Angina pectoris (HCC)    CHF (congestive heart failure) (HCC)    Chronic maxillary sinusitis    Chronic pain syndrome    BACK   Chronic right sacroiliac joint pain    Collagen vascular disease ([place])    Coronary artery abnormality    Dysrhythmia    AFIB,  FREQ PVC   Gastroparesis    GERD (gastroesophageal reflux disease)    History of kidney stones    Leukocytosis    Obesity    Patent foramen ovale    Rheumatoid arthritis ([place])    OSTEOARTHRITIS   Sleep apnea    Spinal stenosis of lumbar region    Unequal leg length    Vasculitis ([place])    Wegener's disease, pulmonary     Past Surgical History:  Procedure Laterality Date   APPENDECTOMY     CHOLECYSTECTOMY     JOINT REPLACEMENT Right    knee   KNEE SURGERY Left    NASAL HEMORRHAGE CONTROL N/A 05/13/2021   Procedure: EPISTAXIS CONTROL;  Surgeon: Carloyn Manner, MD;  Location: [facility];  Service: ENT;  Laterality: N/A;   PULSE GENERATOR IMPLANT Left 06/23/2020   Procedure: LEFT FLANK PULSE GENERATOR IMPLANT;  Surgeon: Deetta Perla, MD;  Location: [facility];  Service: Neurosurgery;  Laterality: Left;   SHOULDER SURGERY Bilateral    THORACIC LAMINECTOMY FOR  SPINAL CORD STIMULATOR Bilateral 06/23/2020   Procedure: THORACIC SPINAL CORD STIMULATOR;  Surgeon: Deetta Perla, MD;  Location: [facility];  Service: Neurosurgery;  Laterality: Bilateral;   VIDEO BRONCHOSCOPY WITH ENDOBRONCHIAL NAVIGATION N/A 02/27/2021   Procedure: VIDEO BRONCHOSCOPY WITH ENDOBRONCHIAL NAVIGATION;  Surgeon: Ottie Glazier, MD;  Location: [facility];  Service: Thoracic;  Laterality: N/A;   VIDEO BRONCHOSCOPY WITH ENDOBRONCHIAL ULTRASOUND N/A 02/27/2021   Procedure: VIDEO BRONCHOSCOPY WITH ENDOBRONCHIAL ULTRASOUND;  Surgeon: Ottie Glazier, MD;  Location: [facility];  Service: Thoracic;  Laterality: N/A;    Vitals:   08/12/21 1508  BP: (!) 152/67  Pulse: 71      Subjective Assessment - 08/12/21 1508     Subjective 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 She describes not being active because of pain, but that she wants to be. She has had numerous surgeries including total knee replacement, RTC repair, and spinal stimulator. 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 Ever since she had this incident  she has had worsening back pain.    Pertinent History Dr. Gena Fray Note 01/29/20     Ms Mcleish reports low back pain since she injured her back in junior high after playing sports. The pain continued to worsen in her early 20's. She says her pain "sometimes radiates down and will catch on me if I'm walking. Then sometimes I feel like it wants to give way". She feels her pain is worse when walking, sitting for long periods of time, or rolling over in bed. She doesn't feel that anything makes the pain better. She says her back pain keeps her from being able to do activities she enjoys like kayaking and hiking.   She denies having any surgeries for her back pain in the past. She has completed PT  and seen a chiropractor in the past without any benefit.    Limitations Standing;Sitting;Other (comment)   Laying down very uncomfortable   How long can you sit comfortably? Few minutes    How long can you stand comfortably? 1 minutes at most    How long can you walk comfortably? Few minutes    Patient Stated Goals Wants to increase flexibility of her back muscles    Currently in Pain? Yes    Pain Score 8     Pain Location Back    Pain Orientation Left;Lower    Pain Descriptors / Indicators Aching;Stabbing    Pain Type Chronic pain    Pain Radiating Towards Left hip and left leg    Pain Onset More than a month ago    Aggravating Factors  Standing while washing dishes    Pain Relieving Factors Nothing at moment    Effect of Pain on Daily Activities Unable to stand to wash dishses             LOW BACK EVALUATION   - Cauda Equina Syndrome: Negative to all symptoms  Bladder/bowel dysfunction Saddle anesthesia  Sexual dysfunction Possible neurological deficits in the lower limb (motor or sensory loss, reflex change)   AROM (in standing):         Lumbar flexion:  100% *                ext: 100% *                SB R/L: 100% 100%*                 Rot R/L: 100% 100%   AROM:                   R      L          [place]           Hip flexion:  120    120         120             ER:             45    45              45              IR:              20     20             45   AROM:                   R  L          [place]           Hip flexion:  120    120         120             ER:             45    45              45              IR:              20     20             45   Strength:              R            L          Hip flex:     4/5         4/5               abd:       4/5         4/5        Knee flex:   5/5          5/5               ext:        3+/5       3+/5         Ankle DF:  5/5          5/5    Special Tests:          DTR-Patella: NT          Slump test: -B           SLR: -B          FABERS: + B          FADDIR: -B          Leg length symmetry: LLE>RLE          Thigh thrust: -B          Thomas Test: -B      Plan - 08/12/21 2030     Clinical Impression Statement Pt is a 55 yo white woman who presents for evaluation of low back pain. Despite initial referral for lumbar radiculopathy, lumbar radiculopathy was ruled out during evaluation and numbness and tingling symptoms could not be reproduced. Pt does have potential for left glute med tendinopathy with increased pain in glute med upon palpation and postive faber test. Pt also demonstrates increased hip weakness, decreased left paraspinal extensibility and increased lumbar pain with closing motions such as side bending. She will continue to benefit from skilled PT to increase hip strength and paraspinal flexibility to decrease low back pain in order to stand and sit for prolonged periods of time to carryout cooking or cleaning at home.    Personal Factors and Comorbidities Comorbidity 3+    Comorbidities CHF, Angina Pectoris, Spinal Stenosis of Lumbar Region, Obesity    Examination-Activity Limitations Lift;Sit;Stand;Sleep;Squat;Stairs;Bend    Examination-Participation Restrictions Meal Prep;Occupation;Driving    Stability/Clinical Decision Making Stable/Uncomplicated    Clinical Decision Making Moderate    Rehab Potential Fair    PT Frequency 2x / week    PT Duration Other (comment)   5   PT Treatment/Interventions Moist Heat;Gait training;Therapeutic  activities;Therapeutic exercise;Balance training;Neuromuscular re-education;Cryotherapy;Aquatic Therapy;Passive range of motion;Dry needling;Joint Manipulations;Spinal Manipulations    PT Next Visit Plan Finish functional assessment: 5 x STS, 30 sec chair stands, 41mT, 10 mwt. Progress LE strengthening and flexibility exercises            HEP includes following exercises:  Access Code: [email or web address] URL: [email or web address] Date:  08/12/2021 Prepared by: DBradly Chris Exercises Supine Lower Trunk Rotation - 1 x daily - 7 x weekly - 3 sets - 10 reps Supine Single Knee to Chest Stretch - 1 x daily - 7 x weekly - 1 sets - 5 reps - 30 hold Seated Hip External Rotation Stretch - 1 x daily - 7 x weekly - 1 sets - 5 reps - 30 hol  Patient will benefit from skilled therapeutic intervention in order to improve the following deficits and impairments:  Abnormal gait, Postural dysfunction, Decreased mobility, Decreased strength, Pain, Impaired flexibility, Hypermobility  Visit Diagnosis: Chronic left-sided low back pain with left-sided sciatica     Problem List Patient Active Problem List   Diagnosis Date Noted   Epistaxis 05/13/2021   Bleeding nose 05/13/2021   Leukocytosis 05/13/2021   Chronic diastolic CHF (congestive heart failure) (HCC) 05/13/2021   Normocytic anemia 05/13/2021   Chronic radicular lumbar pain 01/29/2020   Status post total right knee replacement 01/20/2020   Frequent PVCs 01/20/2020   Chronic sacroiliac joint pain 02/13/2019   Atrial fibrillation, chronic ([place] 01/01/2019   Asthma 01/01/2019   Coronary artery disease involving native heart with angina pectoris ([facility] 01/01/2019   Heart failure, unspecified ([facility] 01/01/2019   Obstructive sleep apnea 01/01/2019   Rheumatoid arthritis ([place] 01/01/2019   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 [place] [address]Phone: [phone]   Fax:  [phone] Name: Cassandra BOGINMRN: [id number] of Birth: [birth year]

## 2021-08-12 NOTE — Addendum Note (Signed)
Addended by: Daneil Dan on: 08/12/2021 09:07 PM   Modules accepted: Orders

## 2021-08-17 ENCOUNTER — Encounter: Payer: Medicare Other | Admitting: Physical Therapy

## 2021-08-19 ENCOUNTER — Encounter: Payer: Medicare Other | Admitting: Physical Therapy

## 2021-08-21 ENCOUNTER — Encounter: Payer: Self-pay | Admitting: Student in an Organized Health Care Education/Training Program

## 2021-08-21 DIAGNOSIS — G8929 Other chronic pain: Secondary | ICD-10-CM

## 2021-08-21 DIAGNOSIS — M7918 Myalgia, other site: Secondary | ICD-10-CM

## 2021-08-21 DIAGNOSIS — M47818 Spondylosis without myelopathy or radiculopathy, sacral and sacrococcygeal region: Secondary | ICD-10-CM

## 2021-08-24 DIAGNOSIS — M47818 Spondylosis without myelopathy or radiculopathy, sacral and sacrococcygeal region: Secondary | ICD-10-CM | POA: Insufficient documentation

## 2021-08-24 DIAGNOSIS — M7918 Myalgia, other site: Secondary | ICD-10-CM | POA: Insufficient documentation

## 2021-08-25 ENCOUNTER — Ambulatory Visit: Payer: Medicare Other | Admitting: Physical Therapy

## 2021-08-27 ENCOUNTER — Ambulatory Visit: Payer: Medicare Other | Admitting: Physical Therapy

## 2021-09-02 ENCOUNTER — Encounter: Payer: Medicare Other | Admitting: Physical Therapy

## 2021-09-08 ENCOUNTER — Encounter: Payer: Medicare Other | Admitting: Physical Therapy

## 2021-09-09 ENCOUNTER — Encounter: Payer: Self-pay | Admitting: Student in an Organized Health Care Education/Training Program

## 2021-09-09 ENCOUNTER — Other Ambulatory Visit: Payer: Self-pay

## 2021-09-09 ENCOUNTER — Ambulatory Visit
Admission: RE | Admit: 2021-09-09 | Discharge: 2021-09-09 | Disposition: A | Discharge status: Home / Self Care | Payer: Medicare Other | Source: Ambulatory Visit | Attending: Student in an Organized Health Care Education/Training Program | Admitting: Student in an Organized Health Care Education/Training Program

## 2021-09-09 ENCOUNTER — Ambulatory Visit (HOSPITAL_BASED_OUTPATIENT_CLINIC_OR_DEPARTMENT_OTHER): Payer: Medicare Other | Admitting: Student in an Organized Health Care Education/Training Program

## 2021-09-09 VITALS — BP 120/70 | HR 71 | Temp 97.2°F | Resp 18 | Ht 62.0 in | Wt 210.0 lb

## 2021-09-09 DIAGNOSIS — M533 Sacrococcygeal disorders, not elsewhere classified: Secondary | ICD-10-CM

## 2021-09-09 DIAGNOSIS — M47818 Spondylosis without myelopathy or radiculopathy, sacral and sacrococcygeal region: Secondary | ICD-10-CM

## 2021-09-09 DIAGNOSIS — Z7952 Long term (current) use of systemic steroids: Secondary | ICD-10-CM | POA: Insufficient documentation

## 2021-09-09 DIAGNOSIS — Z885 Allergy status to narcotic agent status: Secondary | ICD-10-CM | POA: Diagnosis not present

## 2021-09-09 DIAGNOSIS — Z7951 Long term (current) use of inhaled steroids: Secondary | ICD-10-CM | POA: Diagnosis not present

## 2021-09-09 DIAGNOSIS — Z96651 Presence of right artificial knee joint: Secondary | ICD-10-CM | POA: Diagnosis not present

## 2021-09-09 DIAGNOSIS — G8929 Other chronic pain: Secondary | ICD-10-CM

## 2021-09-09 DIAGNOSIS — Z9682 Presence of neurostimulator: Secondary | ICD-10-CM | POA: Diagnosis not present

## 2021-09-09 DIAGNOSIS — M461 Sacroiliitis, not elsewhere classified: Secondary | ICD-10-CM | POA: Diagnosis not present

## 2021-09-09 DIAGNOSIS — Z7982 Long term (current) use of aspirin: Secondary | ICD-10-CM | POA: Insufficient documentation

## 2021-09-09 DIAGNOSIS — Z79899 Other long term (current) drug therapy: Secondary | ICD-10-CM | POA: Insufficient documentation

## 2021-09-09 DIAGNOSIS — M7918 Myalgia, other site: Secondary | ICD-10-CM

## 2021-09-09 DIAGNOSIS — Z882 Allergy status to sulfonamides status: Secondary | ICD-10-CM | POA: Diagnosis not present

## 2021-09-09 DIAGNOSIS — Z886 Allergy status to analgesic agent status: Secondary | ICD-10-CM | POA: Diagnosis not present

## 2021-09-09 DIAGNOSIS — M549 Dorsalgia, unspecified: Secondary | ICD-10-CM | POA: Diagnosis not present

## 2021-09-09 MED ORDER — SODIUM CHLORIDE (PF) 0.9 % IJ SOLN
INTRAMUSCULAR | Status: AC
  Filled 2021-09-09: qty 10

## 2021-09-09 MED ORDER — ROPIVACAINE HCL 2 MG/ML IJ SOLN
9.0000 mL | Freq: Once | INTRAMUSCULAR | Status: AC
  Administered 2021-09-09: 9 mL via PERINEURAL

## 2021-09-09 MED ORDER — LIDOCAINE HCL 2 % IJ SOLN
20.0000 mL | Freq: Once | INTRAMUSCULAR | Status: AC
  Administered 2021-09-09: 100 mg

## 2021-09-09 MED ORDER — IOHEXOL 180 MG/ML  SOLN
10.0000 mL | Freq: Once | INTRAMUSCULAR | Status: AC
  Administered 2021-09-09: 5 mL via EPIDURAL

## 2021-09-09 MED ORDER — DEXAMETHASONE SODIUM PHOSPHATE 10 MG/ML IJ SOLN
10.0000 mg | Freq: Once | INTRAMUSCULAR | Status: AC
  Administered 2021-09-09: 10 mg
  Filled 2021-09-09: qty 1

## 2021-09-09 MED ORDER — METHYLPREDNISOLONE ACETATE 80 MG/ML IJ SUSP
80.0000 mg | Freq: Once | INTRAMUSCULAR | Status: AC
  Administered 2021-09-09: 80 mg via INTRA_ARTICULAR
  Filled 2021-09-09: qty 1

## 2021-09-09 NOTE — Progress Notes (Signed)
PROVIDER NOTE: Information contained herein reflects review and annotations entered in association with encounter. Interpretation of such information and data should be left to medically-trained personnel. Information provided to patient can be located elsewhere in the medical record under "Patient Instructions". Document created using STT-dictation technology, any transcriptional errors that may result from process are unintentional.    Patient: Cassandra Brown  Service Category: Procedure  Provider: Gillis Santa, MD  DOB: [birth year]  DOS: 09/09/2021  Location: [facility]  MRN: [id number]  Setting: Ambulatory - outpatient  Referring Provider: Gillis Santa, MD  Type: Established Patient  Specialty: Interventional Pain Management  PCP: Baxter Hire, MD   Primary Reason for Visit: Interventional Pain Management Treatment. CC: Back Pain (low) and Hip Pain (left)    Procedure:          Anesthesia, Analgesia, Anxiolysis:  Type: LEFT Diagnostic Sacroiliac Joint Steroid Injection #1 & LEFT Piriformis TPI #1  Region: Inferior Lumbosacral Region Level: PIIS (Posterior Inferior Iliac Spine) Laterality: Left  Type: Local Anesthesia Local Anesthetic: Lidocaine 1-2%   Position: Prone           Indications: 1. Piriformis muscle pain   2. Chronic sacroiliac joint pain   3. SI joint arthritis    Pain Score: Pre-procedure: 8 /10 Post-procedure: 8 /10     Pre-op H&P Assessment:  Cassandra Brown is a 55 y.o. (year old), female patient, seen today for interventional treatment. She  has a past surgical history that includes Joint replacement (Right); Knee surgery (Left); Shoulder surgery (Bilateral); Appendectomy; Cholecystectomy; Thoracic laminectomy for spinal cord stimulator (Bilateral, 06/23/2020); Pulse generator implant (Left, 06/23/2020); Video bronchoscopy with endobronchial ultrasound (N/A, 02/27/2021); Video bronchoscopy with endobronchial navigation (N/A, 02/27/2021); and Nasal  hemorrhage control (N/A, 05/13/2021). Cassandra Brown has a current medication list which includes the following prescription(s): aspirin ec, bac, carisoprodol, cholecalciferol, clobetasol cream, dupixent, emgality, trelegy ellipta, folic acid, furosemide, gentamicin ointment, hydrocodone-acetaminophen, hydrocodone-acetaminophen, methotrexate, montelukast, naloxone, nurtec, pantoprazole, pentoxifylline, pimecrolimus, prednisone, pregabalin, promethazine, propranolol er, rosuvastatin, sodium chloride, sotalol, centrum performance, sucralfate, trelegy ellipta, triamcinolone cream, and simponi. Her primarily concern today is the Back Pain (low) and Hip Pain (left)  Initial Vital Signs:  Pulse/HCG Rate: 71ECG Heart Rate: 64 Temp: (!) 97.2 F (36.2 C) Resp: 16 BP: 114/78 SpO2: 99 %  BMI: Estimated body mass index is 38.41 kg/m as calculated from the following:   Height as of this encounter: '5\' 2"'$  (1.575 m).   Weight as of this encounter: 210 lb (95.3 kg).  Risk Assessment: Allergies: Reviewed. She is allergic to cyclobenzaprine, ibuprofen, morphine, meperidine, sulfa antibiotics, and onion.  Allergy Precautions: None required Coagulopathies: Reviewed. None identified.  Blood-thinner therapy: None at this time Active Infection(s): Reviewed. None identified. Cassandra Brown is afebrile  Site Confirmation: Cassandra Brown was asked to confirm the procedure and laterality before marking the site Procedure checklist: Completed Consent: Before the procedure and under the influence of no sedative(s), amnesic(s), or anxiolytics, the patient was informed of the treatment options, risks and possible complications. To fulfill our ethical and legal obligations, as recommended by the American Medical Association's Code of Ethics, I have informed the patient of my clinical impression; the nature and purpose of the treatment or procedure; the risks, benefits, and possible complications of the intervention; the alternatives,  including doing nothing; the risk(s) and benefit(s) of the alternative treatment(s) or procedure(s); and the risk(s) and benefit(s) of doing nothing. The patient was provided information about the general risks and possible complications associated with  the procedure. These may include, but are not limited to: failure to achieve desired goals, infection, bleeding, organ or nerve damage, allergic reactions, paralysis, and death. In addition, the patient was informed of those risks and complications associated to the procedure, such as failure to decrease pain; infection; bleeding; organ or nerve damage with subsequent damage to sensory, motor, and/or autonomic systems, resulting in permanent pain, numbness, and/or weakness of one or several areas of the body; allergic reactions; (i.e.: anaphylactic reaction); and/or death. Furthermore, the patient was informed of those risks and complications associated with the medications. These include, but are not limited to: allergic reactions (i.e.: anaphylactic or anaphylactoid reaction(s)); adrenal axis suppression; blood sugar elevation that in diabetics may result in ketoacidosis or comma; water retention that in patients with history of congestive heart failure may result in shortness of breath, pulmonary edema, and decompensation with resultant heart failure; weight gain; swelling or edema; medication-induced neural toxicity; particulate matter embolism and blood vessel occlusion with resultant organ, and/or nervous system infarction; and/or aseptic necrosis of one or more joints. Finally, the patient was informed that Medicine is not an exact science; therefore, there is also the possibility of unforeseen or unpredictable risks and/or possible complications that may result in a catastrophic outcome. The patient indicated having understood very clearly. We have given the patient no guarantees and we have made no promises. Enough time was given to the patient to ask  questions, all of which were answered to the patient's satisfaction. Cassandra Brown has indicated that she wanted to continue with the procedure. Attestation: I, the ordering provider, attest that I have discussed with the patient the benefits, risks, side-effects, alternatives, likelihood of achieving goals, and potential problems during recovery for the procedure that I have provided informed consent. Date  Time: 09/09/2021 10:51 AM  Pre-Procedure Preparation:  Monitoring: As per clinic protocol. Respiration, ETCO2, SpO2, BP, heart rate and rhythm monitor placed and checked for adequate function Safety Precautions: Patient was assessed for positional comfort and pressure points before starting the procedure. Time-out: I initiated and conducted the "Time-out" before starting the procedure, as per protocol. The patient was asked to participate by confirming the accuracy of the "Time Out" information. Verification of the correct person, site, and procedure were performed and confirmed by me, the nursing staff, and the patient. "Time-out" conducted as per Joint Commission's Universal Protocol (UP.01.01.01). Time: 1155  Description of Procedure:          Target Area: Superior, posterior, aspect of the sacroiliac fissure Approach: Posterior, paraspinal, ipsilateral approach. Area Prepped: Entire Lower Lumbosacral Region DuraPrep (Iodine Povacrylex [0.7% available iodine] and Isopropyl Alcohol, 74% w/w) Safety Precautions: Aspiration looking for blood return was conducted prior to all injections. At no point did we inject any substances, as a needle was being advanced. No attempts were made at seeking any paresthesias. Safe injection practices and needle disposal techniques used. Medications properly checked for expiration dates. SDV (single dose vial) medications used. Description of the Procedure: Protocol guidelines were followed. The patient was placed in position over the procedure table. The target area  was identified and the area prepped in the usual manner. Skin & deeper tissues infiltrated with local anesthetic. Appropriate amount of time allowed to pass for local anesthetics to take effect. The procedure needle was advanced under fluoroscopic guidance into the sacroiliac joint until a firm endpoint was obtained. Proper needle placement secured. Negative aspiration confirmed. Solution injected in intermittent fashion, asking for systemic symptoms every 0.5cc of injectate. The needles  were then removed and the area cleansed, making sure to leave some of the prepping solution back to take advantage of its long term bactericidal properties. Vitals:   09/09/21 1059 09/09/21 1154 09/09/21 1159 09/09/21 1202  BP: 114/78 136/73 121/68 120/70  Pulse: 71     Resp: '16 18 16 18  '$ Temp: (!) 97.2 F (36.2 C)     SpO2: 99% 98% 97% 98%  Weight: 210 lb (95.3 kg)     Height: '5\' 2"'$  (1.575 m)       Start Time: 1155 hrs. End Time: 1202 hrs. Materials:  Needle(s) Type: Spinal Needle Gauge: 22G Length: 3.5-in Medication(s): Please see orders for medications and dosing details. 5 cc solution made of 4 cc of 0.2% ropivacaine, 1 cc of methylprednisolone, 80 mg/cc.  Injected into the left sacroiliac joint  Afterwards a LEFT piriformis trigger point injection was done 1 cm inferior, 1 cm deep, 1 cm lateral to the inferior fissure of the SI joint.  Contrast was injected to confirm piriformis muscle striation.  While injecting, patient did not complain of any pain radiating down her leg. 5 cc solution made of 4 cc of 0.2% ropivacaine, 1 cc of Decadron 10 mg/cc.  This was injected into the left piriformis muscle.  Imaging Guidance (Non-Spinal):          Type of Imaging Technique: Fluoroscopy Guidance (Non-Spinal) Indication(s): Assistance in needle guidance and placement for procedures requiring needle placement in or near specific anatomical locations not easily accessible without such assistance. Exposure Time:  Please see nurses notes. Contrast: Before injecting any contrast, we confirmed that the patient did not have an allergy to iodine, shellfish, or radiological contrast. Once satisfactory needle placement was completed at the desired level, radiological contrast was injected. Contrast injected under live fluoroscopy. No contrast complications. See chart for type and volume of contrast used. Fluoroscopic Guidance: I was personally present during the use of fluoroscopy. "Tunnel Vision Technique" used to obtain the best possible view of the target area. Parallax error corrected before commencing the procedure. "Direction-depth-direction" technique used to introduce the needle under continuous pulsed fluoroscopy. Once target was reached, antero-posterior, oblique, and lateral fluoroscopic projection used confirm needle placement in all planes. Images permanently stored in EMR. Interpretation: I personally interpreted the imaging intraoperatively. Adequate needle placement confirmed in multiple planes. Appropriate spread of contrast into desired area was observed. No evidence of afferent or efferent intravascular uptake. Permanent images saved into the patient's record.   Post-operative Assessment:  Post-procedure Vital Signs:  Pulse/HCG Rate: 7160 Temp:  (!) 97.2 F (36.2 C) Resp: 18 BP: 120/70 SpO2: 98 %  EBL: None  Complications: No immediate post-treatment complications observed by team, or reported by patient.  Note: The patient tolerated the entire procedure well. A repeat set of vitals were taken after the procedure and the patient was kept under observation following institutional policy, for this type of procedure. Post-procedural neurological assessment was performed, showing return to baseline, prior to discharge. The patient was provided with post-procedure discharge instructions, including a section on how to identify potential problems. Should any problems arise concerning this procedure,  the patient was given instructions to immediately contact us, at any time, without hesitation. In any case, we plan to contact the patient by telephone for a follow-up status report regarding this interventional procedure.  Comments:  No additional relevant information.  Plan of Care  Orders:  Orders Placed This Encounter  Procedures   DG PAIN [facility] C-ARM 1-60 MIN NO REPORT  Intraoperative interpretation by procedural physician at [place].    Standing Status:   Standing    Number of Occurrences:   1    Order Specific Question:   Reason for exam:    Answer:   Assistance in needle guidance and placement for procedures requiring needle placement in or near specific anatomical locations not easily accessible without such assistance.    Medications ordered for procedure: Meds ordered this encounter  Medications   iohexol (OMNIPAQUE) 180 MG/ML injection 10 mL    Must be Myelogram-compatible. If not available, you may substitute with a water-soluble, non-ionic, hypoallergenic, myelogram-compatible radiological contrast medium.   lidocaine (XYLOCAINE) 2 % (with pres) injection 400 mg   dexamethasone (DECADRON) injection 10 mg   ropivacaine (PF) 2 mg/mL (0.2%) (NAROPIN) injection 9 mL   methylPREDNISolone acetate (DEPO-MEDROL) injection 80 mg    Medications administered: We administered iohexol, lidocaine, dexamethasone, ropivacaine (PF) 2 mg/mL (0.2%), and methylPREDNISolone acetate.  See the medical record for exact dosing, route, and time of administration.  Follow-up plan:   Return in about 5 weeks (around 10/14/2021) for Post Procedure Evaluation, in person.       Left L4/5 ESI #1 07/13/21, left piriformis, left sacroiliac joint injection 09/09/2021    Recent Visits Date Type Provider Dept  08/05/21 Procedure visit Gillis Santa, MD [facility]  07/30/21 Telemedicine Gillis Santa, MD [facility]  07/13/21 Procedure visit Gillis Santa, MD  [facility]  06/22/21 Office Visit Gillis Santa, MD [facility]  Showing recent visits within past 90 days and meeting all other requirements Today's Visits Date Type Provider Dept  09/09/21 Procedure visit Gillis Santa, MD [facility]  Showing today's visits and meeting all other requirements Future Appointments Date Type Provider Dept  10/12/21 Appointment Gillis Santa, MD [facility]  Showing future appointments within next 90 days and meeting all other requirements Disposition: Discharge home  Discharge (Date  Time): 09/09/2021; 1215 hrs.   Primary Care Physician: Baxter Hire, MD Location: [facility] Note by: Gillis Santa, MD Date: 09/09/2021; Time: 2:04 PM  Disclaimer:  Medicine is not an exact science. The only guarantee in medicine is that nothing is guaranteed. It is important to note that the decision to proceed with this intervention was based on the information collected from the patient. The Data and conclusions were drawn from the patient's questionnaire, the interview, and the physical examination. Because the information was provided in large part by the patient, it cannot be guaranteed that it has not been purposely or unconsciously manipulated. Every effort has been made to obtain as much relevant data as possible for this evaluation. It is important to note that the conclusions that lead to this procedure are derived in large part from the available data. Always take into account that the treatment will also be dependent on availability of resources and existing treatment guidelines, considered by other Pain Management Practitioners as being common knowledge and practice, at the time of the intervention. For Medico-Legal purposes, it is also important to point out that variation in procedural techniques and pharmacological choices are the acceptable norm. The indications, contraindications, technique,  and results of the above procedure should only be interpreted and judged by a Board-Certified Interventional Pain Specialist with extensive familiarity and expertise in the same exact procedure and technique.

## 2021-09-09 NOTE — Patient Instructions (Signed)
Pain Management Discharge Instructions  General Discharge Instructions :  If you need to reach your doctor call: Monday-Friday 8:00 am - 4:00 pm at [phone] or toll free [phone].  After clinic hours [phone] to have operator reach doctor.  Bring all of your medication bottles to all your appointments in the pain clinic.  To cancel or reschedule your appointment with Pain Management please remember to call 24 hours in advance to avoid a fee.  Refer to the educational materials which you have been given on: General Risks, I had my Procedure. Discharge Instructions, Post Sedation.  Post Procedure Instructions:  The drugs you were given will stay in your system until tomorrow, so for the next 24 hours you should not drive, make any legal decisions or drink any alcoholic beverages.  You may eat anything you prefer, but it is better to start with liquids then soups and crackers, and gradually work up to solid foods.  Please notify your doctor immediately if you have any unusual bleeding, trouble breathing or pain that is not related to your normal pain.  Depending on the type of procedure that was done, some parts of your body may feel week and/or numb.  This usually clears up by tonight or the next day.  Walk with the use of an assistive device or accompanied by an adult for the 24 hours.  You may use ice on the affected area for the first 24 hours.  Put ice in a Ziploc bag and cover with a towel and place against area 15 minutes on 15 minutes off.  You may switch to heat after 24 hours.Sacroiliac (SI) Joint Injection Patient Information  Description: The sacroiliac joint connects the scrum (very low back and tailbone) to the ilium (a pelvic bone which also forms half of the hip joint).  Normally this joint experiences very little motion.  When this joint becomes inflamed or unstable low back and or hip and pelvis pain may result.  Injection of this joint with local anesthetics  (numbing medicines) and steroids can provide diagnostic information and reduce pain.  This injection is performed with the aid of x-ray guidance into the tailbone area while you are lying on your stomach.   You may experience an electrical sensation down the leg while this is being done.  You may also experience numbness.  We also may ask if we are reproducing your normal pain during the injection.  Conditions which may be treated SI injection:  Low back, buttock, hip or leg pain  Preparation for the Injection:  Do not eat any solid food or dairy products within 8 hours of your appointment.  You may drink clear liquids up to 3 hours before appointment.  Clear liquids include water, black coffee, juice or soda.  No milk or cream please. You may take your regular medications, including pain medications with a sip of water before your appointment.  Diabetics should hold regular insulin (if take separately) and take 1/2 normal NPH dose the morning of the procedure.  Carry some sugar containing items with you to your appointment. A driver must accompany you and be prepared to drive you home after your procedure. Bring all of your current medications with you. An IV may be inserted and sedation may be given at the discretion of the physician. A blood pressure cuff, EKG and other monitors will often be applied during the procedure.  Some patients may need to have extra oxygen administered for a short period.  You will   be asked to provide medical information, including your allergies, prior to the procedure.  We must know immediately if you are taking blood thinners (like Coumadin/Warfarin) or if you are allergic to IV iodine contrast (dye).  We must know if you could possible be pregnant.  Possible side effects:  Bleeding from needle site Infection (rare, may require surgery) Nerve injury (rare) Numbness & tingling (temporary) A brief convulsion or seizure Light-headedness (temporary) Pain at  injection site (several days) Decreased blood pressure (temporary) Weakness in the leg (temporary)   Call if you experience:  New onset weakness or numbness of an extremity below the injection site that last more than 8 hours. Hives or difficulty breathing ( go to the emergency room) Inflammation or drainage at the injection site Any new symptoms which are concerning to you  Please note:  Although the local anesthetic injected can often make your back/ hip/ buttock/ leg feel good for several hours after the injections, the pain will likely return.  It takes 3-7 days for steroids to work in the sacroiliac area.  You may not notice any pain relief for at least that one week.  If effective, we will often do a series of three injections spaced 3-6 weeks apart to maximally decrease your pain.  After the initial series, we generally will wait some months before a repeat injection of the same type.  If you have any questions, please call [phone] [facility]   

## 2021-09-09 NOTE — Progress Notes (Signed)
Safety precautions to be maintained throughout the outpatient stay will include: orient to surroundings, keep bed in low position, maintain call bell within reach at all times, provide assistance with transfer out of bed and ambulation.  

## 2021-09-10 ENCOUNTER — Telehealth: Payer: Self-pay | Admitting: *Deleted

## 2021-09-10 ENCOUNTER — Encounter: Payer: Medicare Other | Admitting: Physical Therapy

## 2021-09-10 NOTE — Telephone Encounter (Signed)
Attempted to call for post procedure follow-up. Message left. 

## 2021-09-15 ENCOUNTER — Encounter: Payer: Medicare Other | Admitting: Physical Therapy

## 2021-09-17 ENCOUNTER — Encounter: Payer: Medicare Other | Admitting: Physical Therapy

## 2021-09-22 ENCOUNTER — Encounter: Payer: Medicare Other | Admitting: Physical Therapy

## 2021-09-24 ENCOUNTER — Encounter: Payer: Medicare Other | Admitting: Physical Therapy

## 2021-10-12 ENCOUNTER — Ambulatory Visit
Payer: Medicare Other | Attending: Student in an Organized Health Care Education/Training Program | Admitting: Student in an Organized Health Care Education/Training Program

## 2021-10-12 ENCOUNTER — Other Ambulatory Visit: Payer: Self-pay

## 2021-10-12 ENCOUNTER — Encounter: Payer: Self-pay | Admitting: Student in an Organized Health Care Education/Training Program

## 2021-10-12 VITALS — BP 123/66 | HR 66 | Temp 96.7°F | Resp 16 | Ht 62.0 in | Wt 203.0 lb

## 2021-10-12 DIAGNOSIS — M47818 Spondylosis without myelopathy or radiculopathy, sacral and sacrococcygeal region: Secondary | ICD-10-CM | POA: Diagnosis present

## 2021-10-12 DIAGNOSIS — G894 Chronic pain syndrome: Secondary | ICD-10-CM

## 2021-10-12 DIAGNOSIS — M533 Sacrococcygeal disorders, not elsewhere classified: Secondary | ICD-10-CM

## 2021-10-12 DIAGNOSIS — G8929 Other chronic pain: Secondary | ICD-10-CM

## 2021-10-12 DIAGNOSIS — M7918 Myalgia, other site: Secondary | ICD-10-CM

## 2021-10-12 NOTE — Progress Notes (Signed)
PROVIDER NOTE: Information contained herein reflects review and annotations entered in association with encounter. Interpretation of such information and data should be left to medically-trained personnel. Information provided to patient can be located elsewhere in the medical record under "Patient Instructions". Document created using STT-dictation technology, any transcriptional errors that may result from process are unintentional.    Patient: Cassandra Brown  Service Category: E/M  Provider: Gillis Santa, MD  DOB: [birth year]  DOS: 10/12/2021  Specialty: Interventional Pain Management  MRN: [id number]  Setting: Ambulatory outpatient  PCP: Baxter Hire, MD  Type: Established Patient    Referring Provider: Baxter Hire, MD  Location: Office  Delivery: Face-to-face     HPI  Cassandra Brown, a 55 y.o. year old female, is here today because of her Chronic sacroiliac joint pain [M53.3, G89.29]. [name] primary complain today is Back Pain (Left SI) Last encounter: My last encounter with her was on 09/09/2021.  Pain Assessment: Severity of Chronic pain is reported as a 5 /10. Location: Back Left, Lower/left leg to the thigh. Onset: More than a month ago. Quality: Sharp. Timing: Constant. Modifying factor(s): lying down, sitting. Vitals:  height is _0  (1.575 m) and weight is 203 lb (92.1 kg). Her temporal temperature is 96.7 F (35.9 C) (abnormal). Her blood pressure is 123/66 and her pulse is 66. Her respiration is 16 and oxygen saturation is 96%.   Reason for encounter: post-procedure assessment.    Post-Procedure Evaluation  Procedure (09/09/2021):   Type: LEFT Diagnostic Sacroiliac Joint Steroid Injection #1 & LEFT Piriformis TPI #1  Region: Inferior Lumbosacral Region Level: PIIS (Posterior Inferior Iliac Spine) Laterality: Left  Anxiolysis: Please see nurses note.  Effectiveness during initial hour after procedure (Ultra-Short Term Relief): 30 %  Local anesthetic used:  Long-acting (4-6 hours) Effectiveness: Defined as any analgesic benefit obtained secondary to the administration of local anesthetics. This carries significant diagnostic value as to the etiological location, or anatomical origin, of the pain. Duration of benefit is expected to coincide with the duration of the local anesthetic used.  Effectiveness during initial 4-6 hours after procedure (Short-Term Relief): 30 %   Long-term benefit: Defined as any relief past the pharmacologic duration of the local anesthetics.  Effectiveness past the initial 6 hours after procedure (Long-Term Relief): 30 %   Benefits, current: Defined as benefit present at the time of this evaluation.   Analgesia:  50% but gradually worsening Function: [name] reports improvement in function ROM: [name] reports improvement in ROM   ROS  Constitutional: Denies any fever or chills Gastrointestinal: No reported hemesis, hematochezia, vomiting, or acute GI distress Musculoskeletal:  Left SI joint and piriformis pain improved after SI joint piriformis injection however pain now returning. Neurological: No reported episodes of acute onset apraxia, aphasia, dysarthria, agnosia, amnesia, paralysis, loss of coordination, or loss of consciousness  Medication Review  Centrum Performance, Dupilumab, Fluticasone-Umeclidin-Vilant, Galcanezumab-gnlm, Golimumab, HYDROcodone-acetaminophen, Rimegepant Sulfate, aspirin EC, butalbital-acetaminophen-caffeine, carisoprodol, cholecalciferol, clobetasol cream, folic acid, furosemide, gentamicin ointment, methotrexate, montelukast, naloxone, pantoprazole, pentoxifylline, pimecrolimus, predniSONE, pregabalin, promethazine, propranolol ER, rosuvastatin, sodium chloride, sotalol, sucralfate, and triamcinolone cream  History Review  Allergy: [name] is allergic to cyclobenzaprine, ibuprofen, morphine, meperidine, sulfa antibiotics, and onion. Drug: [name]  reports no history of drug  use. Alcohol:  reports that she does not currently use alcohol. Tobacco:  reports that she quit smoking about 10 years ago. Her smoking use included cigarettes. She has a 99.00 pack-year smoking history. She has never  used smokeless tobacco. Social: [name]  reports that she quit smoking about 10 years ago. Her smoking use included cigarettes. She has a 99.00 pack-year smoking history. She has never used smokeless tobacco. She reports that she does not currently use alcohol. She reports that she does not use drugs. Medical:  has a past medical history of Allergic rhinitis due to pollen, Angina pectoris ([place]), CHF (congestive heart failure) ([place]), Chronic maxillary sinusitis, Chronic pain syndrome, Chronic right sacroiliac joint pain, Collagen vascular disease ([place]), Coronary artery abnormality, Dysrhythmia, Gastroparesis, GERD (gastroesophageal reflux disease), History of kidney stones, Leukocytosis, Obesity, Patent foramen ovale, Rheumatoid arthritis ([place]), Sleep apnea, Spinal stenosis of lumbar region, Unequal leg length, Vasculitis ([place]), and Wegener's disease, pulmonary. Surgical: [name]  has a past surgical history that includes Joint replacement (Right); Knee surgery (Left); Shoulder surgery (Bilateral); Appendectomy; Cholecystectomy; Thoracic laminectomy for spinal cord stimulator (Bilateral, 06/23/2020); Pulse generator implant (Left, 06/23/2020); Video bronchoscopy with endobronchial ultrasound (N/A, 02/27/2021); Video bronchoscopy with endobronchial navigation (N/A, 02/27/2021); and Nasal hemorrhage control (N/A, 05/13/2021). Family: family history is not on file.  Laboratory Chemistry Profile   Renal Lab Results  Component Value Date   BUN 23 (H) 05/14/2021   CREATININE 0.77 05/14/2021   GFRAA >60 06/19/2020   GFRNONAA >60 05/14/2021    Hepatic No results found for: AST, ALT, ALBUMIN, ALKPHOS, HCVAB, AMYLASE, LIPASE, AMMONIA  Electrolytes Lab Results  Component Value Date   NA 143  05/14/2021   K 4.7 05/14/2021   CL 108 05/14/2021   CALCIUM 9.1 05/14/2021   MG 2.1 05/14/2021    Bone No results found for: VD25OH, VD125OH2TOT, DG3875IE3, PI9518AC1, 25OHVITD1, 25OHVITD2, 25OHVITD3, TESTOFREE, TESTOSTERONE  Inflammation (CRP: Acute Phase) (ESR: Chronic Phase) No results found for: CRP, ESRSEDRATE, LATICACIDVEN       Note: Above Lab results reviewed.   Physical Exam  General appearance: Well nourished, well developed, and well hydrated. In no apparent acute distress Mental status: Alert, oriented x 3 (person, place, & time)       Respiratory: No evidence of acute respiratory distress Eyes: PERLA Vitals: BP 123/66 (BP Location: Right Arm, Patient Position: Sitting, Cuff Size: Large)   Pulse 66   Temp (!) 96.7 F (35.9 C) (Temporal)   Resp 16   Ht _0  (1.575 m)   Wt 203 lb (92.1 kg)   SpO2 96%   BMI 37.13 kg/m  BMI: Estimated body mass index is 37.13 kg/m as calculated from the following:   Height as of this encounter: _1  (1.575 m).   Weight as of this encounter: 203 lb (92.1 kg). Ideal: Ideal body weight: 50.1 kg (110 lb 7.2 oz) Adjusted ideal body weight: 66.9 kg (147 lb 7.5 oz)  Positive Patrick's and Faber's on the left, improved from before. Pain overlying piriformis   Assessment   Status Diagnosis  Responding Responding Responding 1. Chronic sacroiliac joint pain   2. SI joint arthritis   3. Piriformis muscle pain   4. Chronic pain syndrome        Plan of Care    Patient states that she had a positive therapeutic response after her previous left SI joint and left piriformis injection.  She states that she was able to go to [place] with her family members.  She states that she would not have been able to do this before given her level of pain.  She is very pleased with the initial results that she saw and would like to repeat the injection.  We can plan on adding additional volume to her left SI joint and piriformis TPI.  Risks and  benefits reviewed and patient like to proceed.  Orders:  Orders Placed This Encounter  Procedures   SACROILIAC JOINT INJECTION    Standing Status:   Future    Standing Expiration Date:   04/12/2022    Scheduling Instructions:     Side: LEFT     Without    Order Specific Question:   Where will this procedure be performed?    Answer:   [facility]   TRIGGER POINT INJECTION    Area: Buttocks region (gluteal area) Indications: Piriformis muscle pain; Left (G57.02) piriformis-syndrome; piriformis muscle spasms (V37.482). CPT code: 20552    Standing Status:   Future    Standing Expiration Date:   04/12/2022    Scheduling Instructions:     Type: Myoneural block (TPI) of piriformis muscle.     Side:  LEFT     Sedation: Patient's choice.     Timeframe: Today    Order Specific Question:   Where will this procedure be performed?    Answer:   [facility]   Follow-up plan:   Return in about 1 week (around 10/19/2021) for Left SI-J + Piriformis TPI#2 , without sedation.     Left L4/5 ESI #1 07/13/21, left piriformis, left sacroiliac joint injection 09/09/2021     Recent Visits Date Type Provider Dept  09/09/21 Procedure visit Gillis Santa, MD [facility]  08/05/21 Procedure visit Gillis Santa, MD [facility]  07/30/21 Telemedicine Gillis Santa, MD [facility]  Showing recent visits within past 90 days and meeting all other requirements Today's Visits Date Type Provider Dept  10/12/21 Office Visit Gillis Santa, MD [facility]  Showing today's visits and meeting all other requirements Future Appointments No visits were found meeting these conditions. Showing future appointments within next 90 days and meeting all other requirements I discussed the assessment and treatment plan with the patient. The patient was provided an opportunity to ask questions and all were answered. The patient agreed with the plan and demonstrated an  understanding of the instructions.  Patient advised to call back or seek an in-person evaluation if the symptoms or condition worsens.  Duration of encounter: 40mnutes.  Note by: BGillis Santa MD Date: 10/12/2021; Time: 2:18 PM

## 2021-10-12 NOTE — Patient Instructions (Signed)
GENERAL RISKS AND COMPLICATIONS ° °What are the risk, side effects and possible complications? °Generally speaking, most procedures are safe.  However, with any procedure there are risks, side effects, and the possibility of complications.  The risks and complications are dependent upon the sites that are lesioned, or the type of nerve block to be performed.  The closer the procedure is to the spine, the more serious the risks are.  Great care is taken when placing the radio frequency needles, block needles or lesioning probes, but sometimes complications can occur. °Infection: Any time there is an injection through the skin, there is a risk of infection.  This is why sterile conditions are used for these blocks.  There are four possible types of infection. °Localized skin infection. °Central Nervous System Infection-This can be in the form of Meningitis, which can be deadly. °Epidural Infections-This can be in the form of an epidural abscess, which can cause pressure inside of the spine, causing compression of the spinal cord with subsequent paralysis. This would require an emergency surgery to decompress, and there are no guarantees that the patient would recover from the paralysis. °Discitis-This is an infection of the intervertebral discs.  It occurs in about 1% of discography procedures.  It is difficult to treat and it may lead to surgery. ° °      2. Pain: the needles have to go through skin and soft tissues, will cause soreness. °      3. Damage to internal structures:  The nerves to be lesioned may be near blood vessels or   ° other nerves which can be potentially damaged. °      4. Bleeding: Bleeding is more common if the patient is taking blood thinners such as  aspirin, Coumadin, Ticiid, Plavix, etc., or if he/she have some genetic predisposition  such as hemophilia. Bleeding into the spinal canal can cause compression of the spinal  cord with subsequent paralysis.  This would require an emergency  surgery to  decompress and there are no guarantees that the patient would recover from the  paralysis. °      5. Pneumothorax:  Puncturing of a lung is a possibility, every time a needle is introduced in  the area of the chest or upper back.  Pneumothorax refers to free air around the  collapsed lung(s), inside of the thoracic cavity (chest cavity).  Another two possible  complications related to a similar event would include: Hemothorax and Chylothorax.   These are variations of the Pneumothorax, where instead of air around the collapsed  lung(s), you may have blood or chyle, respectively. °      6. Spinal headaches: They may occur with any procedures in the area of the spine. °      7. Persistent CSF (Cerebro-Spinal Fluid) leakage: This is a rare problem, but may occur  with prolonged intrathecal or epidural catheters either due to the formation of a fistulous  track or a dural tear. °      8. Nerve damage: By working so close to the spinal cord, there is always a possibility of  nerve damage, which could be as serious as a permanent spinal cord injury with  paralysis. °      9. Death:  Although rare, severe deadly allergic reactions known as "Anaphylactic  reaction" can occur to any of the medications used. °     10. Worsening of the symptoms:  We can always make thing worse. ° °What are the chances   of something like this happening? °Chances of any of this occuring are extremely low.  By statistics, you have more of a chance of getting killed in a motor vehicle accident: while driving to the hospital than any of the above occurring .  Nevertheless, you should be aware that they are possibilities.  In general, it is similar to taking a shower.  Everybody knows that you can slip, hit your head and get killed.  Does that mean that you should not shower again?  Nevertheless always keep in mind that statistics do not mean anything if you happen to be on the wrong side of them.  Even if a procedure has a 1 (one) in a  1,000,000 (million) chance of going wrong, it you happen to be that one..Also, keep in mind that by statistics, you have more of a chance of having something go wrong when taking medications. ° °Who should not have this procedure? °If you are on a blood thinning medication (e.g. Coumadin, Plavix, see list of "Blood Thinners"), or if you have an active infection going on, you should not have the procedure.  If you are taking any blood thinners, please inform your physician. ° °How should I prepare for this procedure? °Do not eat or drink anything at least six hours prior to the procedure. °Bring a driver with you .  It cannot be a taxi. °Come accompanied by an adult that can drive you back, and that is strong enough to help you if your legs get weak or numb from the local anesthetic. °Take all of your medicines the morning of the procedure with just enough water to swallow them. °If you have diabetes, make sure that you are scheduled to have your procedure done first thing in the morning, whenever possible. °If you have diabetes, take only half of your insulin dose and notify our nurse that you have done so as soon as you arrive at the clinic. °If you are diabetic, but only take blood sugar pills (oral hypoglycemic), then do not take them on the morning of your procedure.  You may take them after you have had the procedure. °Do not take aspirin or any aspirin-containing medications, at least eleven (11) days prior to the procedure.  They may prolong bleeding. °Wear loose fitting clothing that may be easy to take off and that you would not mind if it got stained with Betadine or blood. °Do not wear any jewelry or perfume °Remove any nail coloring.  It will interfere with some of our monitoring equipment. ° °NOTE: Remember that this is not meant to be interpreted as a complete list of all possible complications.  Unforeseen problems may occur. ° °BLOOD THINNERS °The following drugs contain aspirin or other products,  which can cause increased bleeding during surgery and should not be taken for 2 weeks prior to and 1 week after surgery.  If you should need take something for relief of minor pain, you may take acetaminophen which is found in Tylenol,m Datril, Anacin-3 and Panadol. It is not blood thinner. The products listed below are.  Do not take any of the products listed below in addition to any listed on your instruction sheet. ° °A.P.C or A.P.C with Codeine Codeine Phosphate Capsules #3 Ibuprofen Ridaura  °ABC compound Congesprin Imuran rimadil  °Advil Cope Indocin Robaxisal  °Alka-Seltzer Effervescent Pain Reliever and Antacid Coricidin or Coricidin-D ° Indomethacin Rufen  °Alka-Seltzer plus Cold Medicine Cosprin Ketoprofen S-A-C Tablets  °Anacin Analgesic Tablets or Capsules Coumadin   Korlgesic Salflex  Anacin Extra Strength Analgesic tablets or capsules CP-2 Tablets Lanoril Salicylate  Anaprox Cuprimine Capsules Levenox Salocol  Anexsia-D Dalteparin Magan Salsalate  Anodynos Darvon compound Magnesium Salicylate Sine-off  Ansaid Dasin Capsules Magsal Sodium Salicylate  Anturane Depen Capsules Marnal Soma  APF Arthritis pain formula Dewitt's Pills Measurin Stanback  Argesic Dia-Gesic Meclofenamic Sulfinpyrazone  Arthritis Bayer Timed Release Aspirin Diclofenac Meclomen Sulindac  Arthritis pain formula Anacin Dicumarol Medipren Supac  Analgesic (Safety coated) Arthralgen Diffunasal Mefanamic Suprofen  Arthritis Strength Bufferin Dihydrocodeine Mepro Compound Suprol  Arthropan liquid Dopirydamole Methcarbomol with Aspirin Synalgos  ASA tablets/Enseals Disalcid Micrainin Tagament  Ascriptin Doan's Midol Talwin  Ascriptin A/D Dolene Mobidin Tanderil  Ascriptin Extra Strength Dolobid Moblgesic Ticlid  Ascriptin with Codeine Doloprin or Doloprin with Codeine Momentum Tolectin  Asperbuf Duoprin Mono-gesic Trendar  Aspergum Duradyne Motrin or Motrin IB Triminicin  Aspirin plain, buffered or enteric coated  Durasal Myochrisine Trigesic  Aspirin Suppositories Easprin Nalfon Trillsate  Aspirin with Codeine Ecotrin Regular or Extra Strength Naprosyn Uracel  Atromid-S Efficin Naproxen Ursinus  Auranofin Capsules Elmiron Neocylate Vanquish  Axotal Emagrin Norgesic Verin  Azathioprine Empirin or Empirin with Codeine Normiflo Vitamin E  Azolid Emprazil Nuprin Voltaren  Bayer Aspirin plain, buffered or children's or timed BC Tablets or powders Encaprin Orgaran Warfarin Sodium  Buff-a-Comp Enoxaparin Orudis Zorpin  Buff-a-Comp with Codeine Equegesic Os-Cal-Gesic   Buffaprin Excedrin plain, buffered or Extra Strength Oxalid   Bufferin Arthritis Strength Feldene Oxphenbutazone   Bufferin plain or Extra Strength Feldene Capsules Oxycodone with Aspirin   Bufferin with Codeine Fenoprofen Fenoprofen Pabalate or Pabalate-SF   Buffets II Flogesic Panagesic   Buffinol plain or Extra Strength Florinal or Florinal with Codeine Panwarfarin   Buf-Tabs Flurbiprofen Penicillamine   Butalbital Compound Four-way cold tablets Penicillin   Butazolidin Fragmin Pepto-Bismol   Carbenicillin Geminisyn Percodan   Carna Arthritis Reliever Geopen Persantine   Carprofen Gold's salt Persistin   Chloramphenicol Goody's Phenylbutazone   Chloromycetin Haltrain Piroxlcam   Clmetidine heparin Plaquenil   Cllnoril Hyco-pap Ponstel   Clofibrate Hydroxy chloroquine Propoxyphen         Before stopping any of these medications, be sure to consult the physician who ordered them.  Some, such as Coumadin (Warfarin) are ordered to prevent or treat serious conditions such as "deep thrombosis", "pumonary embolisms", and other heart problems.  The amount of time that you may need off of the medication may also vary with the medication and the reason for which you were taking it.  If you are taking any of these medications, please make sure you notify your pain physician before you undergo any procedures.         Sacroiliac (SI) Joint  Injection Patient Information  Description: The sacroiliac joint connects the scrum (very low back and tailbone) to the ilium (a pelvic bone which also forms half of the hip joint).  Normally this joint experiences very little motion.  When this joint becomes inflamed or unstable low back and or hip and pelvis pain may result.  Injection of this joint with local anesthetics (numbing medicines) and steroids can provide diagnostic information and reduce pain.  This injection is performed with the aid of x-ray guidance into the tailbone area while you are lying on your stomach.   You may experience an electrical sensation down the leg while this is being done.  You may also experience numbness.  We also may ask if we are reproducing your normal pain during the injection.  Conditions  which may be treated SI injection:  Low back, buttock, hip or leg pain  Preparation for the Injection:  Do not eat any solid food or dairy products within 8 hours of your appointment.  You may drink clear liquids up to 3 hours before appointment.  Clear liquids include water, black coffee, juice or soda.  No milk or cream please. You may take your regular medications, including pain medications with a sip of water before your appointment.  Diabetics should hold regular insulin (if take separately) and take 1/2 normal NPH dose the morning of the procedure.  Carry some sugar containing items with you to your appointment. A driver must accompany you and be prepared to drive you home after your procedure. Bring all of your current medications with you. An IV may be inserted and sedation may be given at the discretion of the physician. A blood pressure cuff, EKG and other monitors will often be applied during the procedure.  Some patients may need to have extra oxygen administered for a short period.  You will be asked to provide medical information, including your allergies, prior to the procedure.  We must know immediately  if you are taking blood thinners (like Coumadin/Warfarin) or if you are allergic to IV iodine contrast (dye).  We must know if you could possible be pregnant.  Possible side effects:  Bleeding from needle site Infection (rare, may require surgery) Nerve injury (rare) Numbness & tingling (temporary) A brief convulsion or seizure Light-headedness (temporary) Pain at injection site (several days) Decreased blood pressure (temporary) Weakness in the leg (temporary)   Call if you experience:  New onset weakness or numbness of an extremity below the injection site that last more than 8 hours. Hives or difficulty breathing ( go to the emergency room) Inflammation or drainage at the injection site Any new symptoms which are concerning to you  Please note:  Although the local anesthetic injected can often make your back/ hip/ buttock/ leg feel good for several hours after the injections, the pain will likely return.  It takes 3-7 days for steroids to work in the sacroiliac area.  You may not notice any pain relief for at least that one week.  If effective, we will often do a series of three injections spaced 3-6 weeks apart to maximally decrease your pain.  After the initial series, we generally will wait some months before a repeat injection of the same type.  If you have any questions, please call [phone] [facility]

## 2021-10-21 ENCOUNTER — Ambulatory Visit
Admission: RE | Admit: 2021-10-21 | Discharge: 2021-10-21 | Disposition: A | Discharge status: Home / Self Care | Payer: Medicare Other | Source: Ambulatory Visit | Attending: Student in an Organized Health Care Education/Training Program | Admitting: Student in an Organized Health Care Education/Training Program

## 2021-10-21 ENCOUNTER — Other Ambulatory Visit: Payer: Self-pay

## 2021-10-21 ENCOUNTER — Encounter: Payer: Self-pay | Admitting: Student in an Organized Health Care Education/Training Program

## 2021-10-21 ENCOUNTER — Ambulatory Visit (HOSPITAL_BASED_OUTPATIENT_CLINIC_OR_DEPARTMENT_OTHER): Payer: Medicare Other | Admitting: Student in an Organized Health Care Education/Training Program

## 2021-10-21 DIAGNOSIS — Z7982 Long term (current) use of aspirin: Secondary | ICD-10-CM | POA: Diagnosis not present

## 2021-10-21 DIAGNOSIS — G894 Chronic pain syndrome: Secondary | ICD-10-CM

## 2021-10-21 DIAGNOSIS — Z882 Allergy status to sulfonamides status: Secondary | ICD-10-CM | POA: Insufficient documentation

## 2021-10-21 DIAGNOSIS — Z885 Allergy status to narcotic agent status: Secondary | ICD-10-CM | POA: Diagnosis not present

## 2021-10-21 DIAGNOSIS — Z886 Allergy status to analgesic agent status: Secondary | ICD-10-CM | POA: Insufficient documentation

## 2021-10-21 DIAGNOSIS — M7918 Myalgia, other site: Secondary | ICD-10-CM

## 2021-10-21 DIAGNOSIS — M47818 Spondylosis without myelopathy or radiculopathy, sacral and sacrococcygeal region: Secondary | ICD-10-CM

## 2021-10-21 DIAGNOSIS — G8929 Other chronic pain: Secondary | ICD-10-CM

## 2021-10-21 DIAGNOSIS — Z79899 Other long term (current) drug therapy: Secondary | ICD-10-CM | POA: Diagnosis not present

## 2021-10-21 DIAGNOSIS — Z7952 Long term (current) use of systemic steroids: Secondary | ICD-10-CM | POA: Diagnosis not present

## 2021-10-21 DIAGNOSIS — M791 Myalgia, unspecified site: Secondary | ICD-10-CM | POA: Insufficient documentation

## 2021-10-21 DIAGNOSIS — M533 Sacrococcygeal disorders, not elsewhere classified: Secondary | ICD-10-CM | POA: Diagnosis not present

## 2021-10-21 MED ORDER — METHYLPREDNISOLONE ACETATE 80 MG/ML IJ SUSP
INTRAMUSCULAR | Status: AC
  Filled 2021-10-21: qty 1

## 2021-10-21 MED ORDER — ROPIVACAINE HCL 2 MG/ML IJ SOLN
9.0000 mL | Freq: Once | INTRAMUSCULAR | Status: AC
  Administered 2021-10-21: 9 mL via PERINEURAL

## 2021-10-21 MED ORDER — DEXAMETHASONE SODIUM PHOSPHATE 10 MG/ML IJ SOLN
10.0000 mg | Freq: Once | INTRAMUSCULAR | Status: DC
  Filled 2021-10-21: qty 1

## 2021-10-21 MED ORDER — DEXAMETHASONE SODIUM PHOSPHATE 10 MG/ML IJ SOLN
10.0000 mg | Freq: Once | INTRAMUSCULAR | Status: AC
  Administered 2021-10-21: 20 mg
  Filled 2021-10-21: qty 1

## 2021-10-21 MED ORDER — IOHEXOL 180 MG/ML  SOLN
10.0000 mL | Freq: Once | INTRAMUSCULAR | Status: AC
  Administered 2021-10-21: 10 mL via INTRA_ARTICULAR
  Filled 2021-10-21: qty 10

## 2021-10-21 MED ORDER — LIDOCAINE HCL 2 % IJ SOLN
20.0000 mL | Freq: Once | INTRAMUSCULAR | Status: AC
  Administered 2021-10-21: 400 mg
  Filled 2021-10-21: qty 20

## 2021-10-21 NOTE — Progress Notes (Signed)
Safety precautions to be maintained throughout the outpatient stay will include: orient to surroundings, keep bed in low position, maintain call bell within reach at all times, provide assistance with transfer out of bed and ambulation.  

## 2021-10-21 NOTE — Patient Instructions (Signed)
____________________________________________________________________________________________  Post-Procedure Discharge Instructions  Instructions: Apply ice:  Purpose: This will minimize any swelling and discomfort after procedure.  When: Day of procedure, as soon as you get home. How: Fill a plastic sandwich bag with crushed ice. Cover it with a small towel and apply to injection site. How long: (15 min on, 15 min off) Apply for 15 minutes then remove x 15 minutes.  Repeat sequence on day of procedure, until you go to bed. Apply heat:  Purpose: To treat any soreness and discomfort from the procedure. When: Starting the next day after the procedure. How: Apply heat to procedure site starting the day following the procedure. How long: May continue to repeat daily, until discomfort goes away. Food intake: Start with clear liquids (like water) and advance to regular food, as tolerated.  Physical activities: Keep activities to a minimum for the first 8 hours after the procedure. After that, then as tolerated. Driving: If you have received any sedation, be responsible and do not drive. You are not allowed to drive for 24 hours after having sedation. Blood thinner: (Applies only to those taking blood thinners) You may restart your blood thinner 6 hours after your procedure. Insulin: (Applies only to Diabetic patients taking insulin) As soon as you can eat, you may resume your normal dosing schedule. Infection prevention: Keep procedure site clean and dry. Shower daily and clean area with soap and water. Post-procedure Pain Diary: Extremely important that this be done correctly and accurately. Recorded information will be used to determine the next step in treatment. For the purpose of accuracy, follow these rules: Evaluate only the area treated. Do not report or include pain from an untreated area. For the purpose of this evaluation, ignore all other areas of pain, except for the treated area. After  your procedure, avoid taking a long nap and attempting to complete the pain diary after you wake up. Instead, set your alarm clock to go off every hour, on the hour, for the initial 8 hours after the procedure. Document the duration of the numbing medicine, and the relief you are getting from it. Do not go to sleep and attempt to complete it later. It will not be accurate. If you received sedation, it is likely that you were given a medication that may cause amnesia. Because of this, completing the diary at a later time may cause the information to be inaccurate. This information is needed to plan your care. Follow-up appointment: Keep your post-procedure follow-up evaluation appointment after the procedure (usually 2 weeks for most procedures, 6 weeks for radiofrequencies). DO NOT FORGET to bring you pain diary with you.   Expect: (What should I expect to see with my procedure?) From numbing medicine (AKA: Local Anesthetics): Numbness or decrease in pain. You may also experience some weakness, which if present, could last for the duration of the local anesthetic. Onset: Full effect within 15 minutes of injected. Duration: It will depend on the type of local anesthetic used. On the average, 1 to 8 hours.  From steroids (Applies only if steroids were used): Decrease in swelling or inflammation. Once inflammation is improved, relief of the pain will follow. Onset of benefits: Depends on the amount of swelling present. The more swelling, the longer it will take for the benefits to be seen. In some cases, up to 10 days. Duration: Steroids will stay in the system x 2 weeks. Duration of benefits will depend on multiple posibilities including persistent irritating factors. Side-effects: If present, they  may typically last 2 weeks (the duration of the steroids). Frequent: Cramps (if they occur, drink Gatorade and take over-the-counter Magnesium 450-500 mg once to twice a day); water retention with temporary  weight gain; increases in blood sugar; decreased immune system response; increased appetite. Occasional: Facial flushing (red, warm cheeks); mood swings; menstrual changes. Uncommon: Long-term decrease or suppression of natural hormones; bone thinning. (These are more common with higher doses or more frequent use. This is why we prefer that our patients avoid having any injection therapies in other practices.)  Very Rare: Severe mood changes; psychosis; aseptic necrosis. From procedure: Some discomfort is to be expected once the numbing medicine wears off. This should be minimal if ice and heat are applied as instructed.  Call if: (When should I call?) You experience numbness and weakness that gets worse with time, as opposed to wearing off. New onset bowel or bladder incontinence. (Applies only to procedures done in the spine)  Emergency Numbers: Durning business hours (Monday - Thursday, 8:00 AM - 4:00 PM) (Friday, 9:00 AM - 12:00 Noon): [phone] After hours: [phone] NOTE: If you are having a problem and are unable connect with, or to talk to a provider, then go to your nearest urgent care or emergency department. If the problem is serious and urgent, please call 911. ____________________________________________________________________________________________  Trigger Point Injection Trigger points are areas where you have pain. A trigger point injection is a shot given in the trigger point to help relieve pain for a few days to a few months. Common places for trigger points include the neck, shoulders, upper back, or lower back. A trigger point injection will not cure long-term (chronic) pain permanently. These injections do not always work for every person. For some people, they can help to relieve pain for a few days to a few months. Tell a health care provider about: Any allergies you have. All medicines you are taking, including vitamins, herbs, eye drops, creams, and  over-the-counter medicines. Any problems you or family members have had with anesthetic medicines. Any bleeding problems you have. Any surgeries you have had. Any medical conditions you have. Whether you are pregnant or may be pregnant. What are the risks? Generally, this is a safe procedure. However, problems may occur, including: Infection. Bleeding or bruising. Allergic reaction to the injected medicine. Irritation of the skin around the injection site. What happens before the procedure? Ask your health care provider about: Changing or stopping your regular medicines. This is especially important if you are taking diabetes medicines or blood thinners. Taking medicines such as aspirin and ibuprofen. These medicines can thin your blood. Do not take these medicines unless your health care provider tells you to take them. Taking over-the-counter medicines, vitamins, herbs, and supplements. What happens during the procedure?  Your health care provider will feel for trigger points. A marker may be used to circle the area for the injection. The skin over the trigger point will be washed with a germ-killing soap. You may be given a medicine to help you relax (sedative). A thin needle is used for the injection. You may feel pain or a twitching feeling when the needle enters your skin. A numbing solution may be injected into the trigger point. Sometimes a medicine to keep down inflammation is also injected. Your health care provider may move the needle around the area where the trigger point is located until the tightness and twitching goes away. After the injection, your health care provider may put gentle pressure  over the injection site. The injection site will be covered with a bandage (dressing). The procedure may vary among health care providers and hospitals. What can I expect after treatment? After treatment, you may have soreness and stiffness for 1-2 days. Follow these instructions  at home: Injection site care Remove your dressing in a few hours, or as told by your health care provider. Check your injection site every day for signs of infection. Check for: Redness, swelling, or pain. Fluid or blood. Warmth. Pus or a bad smell. Managing pain, stiffness, and swelling If directed, put ice on the affected area. To do this: Put ice in a plastic bag. Place a towel between your skin and the bag. Leave the ice on for 20 minutes, 2-3 times a day. Remove the ice if your skin turns bright red. This is very important. If you cannot feel pain, heat, or cold, you have a greater risk of damage to the area. Activity If you were given a sedative during the procedure, it can affect you for several hours. Do not drive or operate machinery until your health care provider says that it is safe. Do not take baths, swim, or use a hot tub until your health care provider approves. Return to your normal activities as told by your health care provider. Ask your health care provider what activities are safe for you. General instructions If you were asked to stop your regular medicines, ask your health care provider when you may start taking them again. You may be asked to see an occupational or physical therapist for exercises that reduce muscle strain and stretch the area of the trigger point. Keep all follow-up visits. This is important. Contact a health care provider if: Your pain comes back, and it is worse than before the injection. You may need more injections. You have chills or a fever. The injection site becomes more painful, red, swollen, or warm to the touch. Summary A trigger point injection is a shot given in the trigger point to help relieve pain. Common places for trigger point injections are the neck, shoulders, upper back, and lower back. These injections do not always work for every person, but for some people, the injections can help to relieve pain for a few days to a few  months. Contact a health care provider if symptoms come back or if they are worse than before treatment. Also, get help if the injection site becomes more painful, red, swollen, or warm to the touch. This information is not intended to replace advice given to you by your health care provider. Make sure you discuss any questions you have with your health care provider. Document Revised: 03/24/2021 Document Reviewed: 03/24/2021 Elsevier Patient Education  2022 [employer]. Sacroiliac (SI) Joint Injection Patient Information  Description: The sacroiliac joint connects the scrum (very low back and tailbone) to the ilium (a pelvic bone which also forms half of the hip joint).  Normally this joint experiences very little motion.  When this joint becomes inflamed or unstable low back and or hip and pelvis pain may result.  Injection of this joint with local anesthetics (numbing medicines) and steroids can provide diagnostic information and reduce pain.  This injection is performed with the aid of x-ray guidance into the tailbone area while you are lying on your stomach.   You may experience an electrical sensation down the leg while this is being done.  You may also experience numbness.  We also may ask if we are reproducing  your normal pain during the injection.  Conditions which may be treated SI injection:  Low back, buttock, hip or leg pain  Preparation for the Injection:  Do not eat any solid food or dairy products within 8 hours of your appointment.  You may drink clear liquids up to 3 hours before appointment.  Clear liquids include water, black coffee, juice or soda.  No milk or cream please. You may take your regular medications, including pain medications with a sip of water before your appointment.  Diabetics should hold regular insulin (if take separately) and take 1/2 normal NPH dose the morning of the procedure.  Carry some sugar containing items with you to your appointment. A driver  must accompany you and be prepared to drive you home after your procedure. Bring all of your current medications with you. An IV may be inserted and sedation may be given at the discretion of the physician. A blood pressure cuff, EKG and other monitors will often be applied during the procedure.  Some patients may need to have extra oxygen administered for a short period.  You will be asked to provide medical information, including your allergies, prior to the procedure.  We must know immediately if you are taking blood thinners (like Coumadin/Warfarin) or if you are allergic to IV iodine contrast (dye).  We must know if you could possible be pregnant.  Possible side effects:  Bleeding from needle site Infection (rare, may require surgery) Nerve injury (rare) Numbness & tingling (temporary) A brief convulsion or seizure Light-headedness (temporary) Pain at injection site (several days) Decreased blood pressure (temporary) Weakness in the leg (temporary)   Call if you experience:  New onset weakness or numbness of an extremity below the injection site that last more than 8 hours. Hives or difficulty breathing ( go to the emergency room) Inflammation or drainage at the injection site Any new symptoms which are concerning to you  Please note:  Although the local anesthetic injected can often make your back/ hip/ buttock/ leg feel good for several hours after the injections, the pain will likely return.  It takes 3-7 days for steroids to work in the sacroiliac area.  You may not notice any pain relief for at least that one week.  If effective, we will often do a series of three injections spaced 3-6 weeks apart to maximally decrease your pain.  After the initial series, we generally will wait some months before a repeat injection of the same type.  If you have any questions, please call [phone] [facility]

## 2021-10-21 NOTE — Progress Notes (Signed)
PROVIDER NOTE: Information contained herein reflects review and annotations entered in association with encounter. Interpretation of such information and data should be left to medically-trained personnel. Information provided to patient can be located elsewhere in the medical record under "Patient Instructions". Document created using STT-dictation technology, any transcriptional errors that may result from process are unintentional.    Patient: Cassandra Brown  Service Category: Procedure  Provider: Gillis Santa, MD  DOB: [birth year]  DOS: 10/21/2021  Location: [facility]  MRN: [id number]  Setting: Ambulatory - outpatient  Referring Provider: Gillis Santa, MD  Type: Established Patient  Specialty: Interventional Pain Management  PCP: Baxter Hire, MD   Primary Reason for Visit: Interventional Pain Management Treatment. CC: Back Pain (Left, lower)    Procedure:          Anesthesia, Analgesia, Anxiolysis:  Type: LEFT Diagnostic Sacroiliac Joint Steroid Injection #2 & LEFT Piriformis TPI #2 (#1 done 09/09/21) Region: Inferior Lumbosacral Region Level: PIIS (Posterior Inferior Iliac Spine) Laterality: Left  Type: Local Anesthesia Local Anesthetic: Lidocaine 1-2%   Position: Prone           Indications: 1. Chronic sacroiliac joint pain   2. SI joint arthritis   3. Piriformis muscle pain   4. Chronic pain syndrome    Pain Score: Pre-procedure: 6 /10 Post-procedure: 5 /10     Pre-op H&P Assessment:  Cassandra Brown is a 55 y.o. (year old), female patient, seen today for interventional treatment. She  has a past surgical history that includes Joint replacement (Right); Knee surgery (Left); Shoulder surgery (Bilateral); Appendectomy; Cholecystectomy; Thoracic laminectomy for spinal cord stimulator (Bilateral, 06/23/2020); Pulse generator implant (Left, 06/23/2020); Video bronchoscopy with endobronchial ultrasound (N/A, 02/27/2021); Video bronchoscopy with endobronchial  navigation (N/A, 02/27/2021); and Nasal hemorrhage control (N/A, 05/13/2021). Cassandra Brown has a current medication list which includes the following prescription(s): aspirin ec, bac, carisoprodol, cholecalciferol, dupixent, emgality, trelegy ellipta, folic acid, furosemide, gentamicin ointment, hydrocodone-acetaminophen, hydrocodone-acetaminophen, methotrexate, montelukast, naloxone, nurtec, pantoprazole, pentoxifylline, pimecrolimus, prednisone, pregabalin, promethazine, propranolol er, rosuvastatin, sodium chloride, sotalol, centrum performance, sucralfate, trelegy ellipta, triamcinolone cream, clobetasol cream, and simponi, and the following Facility-Administered Medications: dexamethasone. Her primarily concern today is the Back Pain (Left, lower)  Initial Vital Signs:  Pulse/HCG Rate: 69ECG Heart Rate: 62 Temp: (!) 96.4 F (35.8 C) Resp: 14 BP: 140/64 SpO2: 99 %  BMI: Estimated body mass index is 37.13 kg/m as calculated from the following:   Height as of this encounter: 5\' 2"  (1.575 m).   Weight as of this encounter: 203 lb (92.1 kg).  Risk Assessment: Allergies: Reviewed. She is allergic to cyclobenzaprine, ibuprofen, morphine, meperidine, sulfa antibiotics, and onion.  Allergy Precautions: None required Coagulopathies: Reviewed. None identified.  Blood-thinner therapy: None at this time Active Infection(s): Reviewed. None identified. Cassandra Brown is afebrile  Site Confirmation: Cassandra Brown was asked to confirm the procedure and laterality before marking the site Procedure checklist: Completed Consent: Before the procedure and under the influence of no sedative(s), amnesic(s), or anxiolytics, the patient was informed of the treatment options, risks and possible complications. To fulfill our ethical and legal obligations, as recommended by the American Medical Association's Code of Ethics, I have informed the patient of my clinical impression; the nature and purpose of the treatment or  procedure; the risks, benefits, and possible complications of the intervention; the alternatives, including doing nothing; the risk(s) and benefit(s) of the alternative treatment(s) or procedure(s); and the risk(s) and benefit(s) of doing nothing. The patient was provided information about  the general risks and possible complications associated with the procedure. These may include, but are not limited to: failure to achieve desired goals, infection, bleeding, organ or nerve damage, allergic reactions, paralysis, and death. In addition, the patient was informed of those risks and complications associated to the procedure, such as failure to decrease pain; infection; bleeding; organ or nerve damage with subsequent damage to sensory, motor, and/or autonomic systems, resulting in permanent pain, numbness, and/or weakness of one or several areas of the body; allergic reactions; (i.e.: anaphylactic reaction); and/or death. Furthermore, the patient was informed of those risks and complications associated with the medications. These include, but are not limited to: allergic reactions (i.e.: anaphylactic or anaphylactoid reaction(s)); adrenal axis suppression; blood sugar elevation that in diabetics may result in ketoacidosis or comma; water retention that in patients with history of congestive heart failure may result in shortness of breath, pulmonary edema, and decompensation with resultant heart failure; weight gain; swelling or edema; medication-induced neural toxicity; particulate matter embolism and blood vessel occlusion with resultant organ, and/or nervous system infarction; and/or aseptic necrosis of one or more joints. Finally, the patient was informed that Medicine is not an exact science; therefore, there is also the possibility of unforeseen or unpredictable risks and/or possible complications that may result in a catastrophic outcome. The patient indicated having understood very clearly. We have given the  patient no guarantees and we have made no promises. Enough time was given to the patient to ask questions, all of which were answered to the patient's satisfaction. Cassandra Brown has indicated that she wanted to continue with the procedure. Attestation: I, the ordering provider, attest that I have discussed with the patient the benefits, risks, side-effects, alternatives, likelihood of achieving goals, and potential problems during recovery for the procedure that I have provided informed consent. Date  Time: 10/21/2021 10:51 AM  Pre-Procedure Preparation:  Monitoring: As per clinic protocol. Respiration, ETCO2, SpO2, BP, heart rate and rhythm monitor placed and checked for adequate function Safety Precautions: Patient was assessed for positional comfort and pressure points before starting the procedure. Time-out: I initiated and conducted the "Time-out" before starting the procedure, as per protocol. The patient was asked to participate by confirming the accuracy of the "Time Out" information. Verification of the correct person, site, and procedure were performed and confirmed by me, the nursing staff, and the patient. "Time-out" conducted as per Joint Commission's Universal Protocol (UP.01.01.01). Time: 1125  Description of Procedure:          Target Area: Superior, posterior, aspect of the sacroiliac fissure Approach: Posterior, paraspinal, ipsilateral approach. Area Prepped: Entire Lower Lumbosacral Region DuraPrep (Iodine Povacrylex [0.7% available iodine] and Isopropyl Alcohol, 74% w/w) Safety Precautions: Aspiration looking for blood return was conducted prior to all injections. At no point did we inject any substances, as a needle was being advanced. No attempts were made at seeking any paresthesias. Safe injection practices and needle disposal techniques used. Medications properly checked for expiration dates. SDV (single dose vial) medications used. Description of the Procedure: Protocol  guidelines were followed. The patient was placed in position over the procedure table. The target area was identified and the area prepped in the usual manner. Skin & deeper tissues infiltrated with local anesthetic. Appropriate amount of time allowed to pass for local anesthetics to take effect. The procedure needle was advanced under fluoroscopic guidance into the sacroiliac joint until a firm endpoint was obtained. Proper needle placement secured. Negative aspiration confirmed. Solution injected in intermittent fashion, asking for  systemic symptoms every 0.5cc of injectate. The needles were then removed and the area cleansed, making sure to leave some of the prepping solution back to take advantage of its long term bactericidal properties. Vitals:   10/21/21 1054 10/21/21 1125 10/21/21 1130 10/21/21 1134  BP: 140/64 125/73 125/73 114/87  Pulse: 69     Resp: 14 18 18 15   Temp: (!) 96.4 F (35.8 C)     TempSrc: Temporal     SpO2: 99% 98% 98% 98%  Weight: 203 lb (92.1 kg)     Height: 5\' 2"  (1.575 m)       Start Time: 1125 hrs. End Time: 1131 hrs. Materials:  Needle(s) Type: Spinal Needle Gauge: 22G Length: 3.5-in Medication(s): Please see orders for medications and dosing details. 5 cc solution made of 4 cc of 0.2% ropivacaine, 1 cc of methylprednisolone, 80 mg/cc.  Injected into the left sacroiliac joint  Afterwards a LEFT piriformis trigger point injection was done 1 cm inferior, 1 cm deep, 1 cm lateral to the inferior fissure of the SI joint.  Contrast was injected to confirm piriformis muscle striation.  While injecting, patient did not complain of any pain radiating down her leg. 5 cc solution made of 3 cc of 0.2% ropivacaine, 2 cc of Decadron 10 mg/cc.  This was injected into the left piriformis muscle.  Imaging Guidance (Non-Spinal):          Type of Imaging Technique: Fluoroscopy Guidance (Non-Spinal) Indication(s): Assistance in needle guidance and placement for procedures  requiring needle placement in or near specific anatomical locations not easily accessible without such assistance. Exposure Time: Please see nurses notes. Contrast: Before injecting any contrast, we confirmed that the patient did not have an allergy to iodine, shellfish, or radiological contrast. Once satisfactory needle placement was completed at the desired level, radiological contrast was injected. Contrast injected under live fluoroscopy. No contrast complications. See chart for type and volume of contrast used. Fluoroscopic Guidance: I was personally present during the use of fluoroscopy. "Tunnel Vision Technique" used to obtain the best possible view of the target area. Parallax error corrected before commencing the procedure. "Direction-depth-direction" technique used to introduce the needle under continuous pulsed fluoroscopy. Once target was reached, antero-posterior, oblique, and lateral fluoroscopic projection used confirm needle placement in all planes. Images permanently stored in EMR. Interpretation: I personally interpreted the imaging intraoperatively. Adequate needle placement confirmed in multiple planes. Appropriate spread of contrast into desired area was observed. No evidence of afferent or efferent intravascular uptake. Permanent images saved into the patient's record.   Post-operative Assessment:  Post-procedure Vital Signs:  Pulse/HCG Rate: 6966 Temp:  (!) 96.4 F (35.8 C) Resp: 15 BP: 114/87 SpO2: 98 %  EBL: None  Complications: No immediate post-treatment complications observed by team, or reported by patient.  Note: The patient tolerated the entire procedure well. A repeat set of vitals were taken after the procedure and the patient was kept under observation following institutional policy, for this type of procedure. Post-procedural neurological assessment was performed, showing return to baseline, prior to discharge. The patient was provided with post-procedure  discharge instructions, including a section on how to identify potential problems. Should any problems arise concerning this procedure, the patient was given instructions to immediately contact us, at any time, without hesitation. In any case, we plan to contact the patient by telephone for a follow-up status report regarding this interventional procedure.  Comments:  No additional relevant information.  Plan of Care  Orders:  Orders Placed  This Encounter  Procedures   DG PAIN [facility] C-ARM 1-60 MIN NO REPORT    Intraoperative interpretation by procedural physician at [facility].    Standing Status:   Standing    Number of Occurrences:   1    Order Specific Question:   Reason for exam:    Answer:   Assistance in needle guidance and placement for procedures requiring needle placement in or near specific anatomical locations not easily accessible without such assistance.     Medications ordered for procedure: Meds ordered this encounter  Medications   iohexol (OMNIPAQUE) 180 MG/ML injection 10 mL    Must be Myelogram-compatible. If not available, you may substitute with a water-soluble, non-ionic, hypoallergenic, myelogram-compatible radiological contrast medium.   lidocaine (XYLOCAINE) 2 % (with pres) injection 400 mg   dexamethasone (DECADRON) injection 10 mg   ropivacaine (PF) 2 mg/mL (0.2%) (NAROPIN) injection 9 mL   dexamethasone (DECADRON) injection 10 mg     Medications administered: We administered iohexol, lidocaine, ropivacaine (PF) 2 mg/mL (0.2%), and dexamethasone.  See the medical record for exact dosing, route, and time of administration.  Follow-up plan:   Return in about 4 weeks (around 11/18/2021) for Post Procedure Evaluation, virtual.       Left L4/5 ESI #1 07/13/21, left piriformis, left sacroiliac joint injection 09/09/2021, 10/21/21    Recent Visits Date Type Provider Dept  10/12/21 Office Visit Gillis Santa, MD [facility]  09/09/21  Procedure visit Gillis Santa, MD [facility]  08/05/21 Procedure visit Gillis Santa, MD [facility]  07/30/21 Telemedicine Gillis Santa, MD [facility]  Showing recent visits within past 90 days and meeting all other requirements Today's Visits Date Type Provider Dept  10/21/21 Procedure visit Gillis Santa, MD [facility]  Showing today's visits and meeting all other requirements Future Appointments Date Type Provider Dept  11/17/21 Appointment Gillis Santa, MD [facility]  Showing future appointments within next 90 days and meeting all other requirements Disposition: Discharge home  Discharge (Date  Time): 10/21/2021; 1145 hrs.   Primary Care Physician: Baxter Hire, MD Location: [facility] Note by: Gillis Santa, MD Date: 10/21/2021; Time: 12:45 PM  Disclaimer:  Medicine is not an exact science. The only guarantee in medicine is that nothing is guaranteed. It is important to note that the decision to proceed with this intervention was based on the information collected from the patient. The Data and conclusions were drawn from the patient's questionnaire, the interview, and the physical examination. Because the information was provided in large part by the patient, it cannot be guaranteed that it has not been purposely or unconsciously manipulated. Every effort has been made to obtain as much relevant data as possible for this evaluation. It is important to note that the conclusions that lead to this procedure are derived in large part from the available data. Always take into account that the treatment will also be dependent on availability of resources and existing treatment guidelines, considered by other Pain Management Practitioners as being common knowledge and practice, at the time of the intervention. For Medico-Legal purposes, it is also important to point out that variation in procedural  techniques and pharmacological choices are the acceptable norm. The indications, contraindications, technique, and results of the above procedure should only be interpreted and judged by a Board-Certified Interventional Pain Specialist with extensive familiarity and expertise in the same exact procedure and technique.

## 2021-10-22 ENCOUNTER — Telehealth: Payer: Self-pay

## 2021-10-22 NOTE — Telephone Encounter (Signed)
Post procedure phone call.  LM 

## 2021-11-17 ENCOUNTER — Ambulatory Visit
Payer: Medicare Other | Attending: Student in an Organized Health Care Education/Training Program | Admitting: Student in an Organized Health Care Education/Training Program

## 2021-11-17 ENCOUNTER — Other Ambulatory Visit: Payer: Self-pay

## 2021-11-17 ENCOUNTER — Encounter: Payer: Self-pay | Admitting: Student in an Organized Health Care Education/Training Program

## 2021-11-17 DIAGNOSIS — M47818 Spondylosis without myelopathy or radiculopathy, sacral and sacrococcygeal region: Secondary | ICD-10-CM | POA: Diagnosis not present

## 2021-11-17 DIAGNOSIS — M7918 Myalgia, other site: Secondary | ICD-10-CM | POA: Diagnosis not present

## 2021-11-17 DIAGNOSIS — M533 Sacrococcygeal disorders, not elsewhere classified: Secondary | ICD-10-CM | POA: Diagnosis not present

## 2021-11-17 DIAGNOSIS — G8929 Other chronic pain: Secondary | ICD-10-CM

## 2021-11-17 DIAGNOSIS — G894 Chronic pain syndrome: Secondary | ICD-10-CM | POA: Diagnosis not present

## 2021-11-17 NOTE — Progress Notes (Signed)
Patient: Cassandra Brown  Service Category: E/M  Provider: Gillis Santa, MD  DOB: 1966/02/23  DOS: 11/17/2021  Location: Office  MRN: [id number]  Setting: Ambulatory outpatient  Referring Provider: Baxter Hire, MD  Type: Established Patient  Specialty: Interventional Pain Management  PCP: Baxter Hire, MD  Location: Remote location  Delivery: TeleHealth     Virtual Encounter - Pain Management PROVIDER NOTE: Information contained herein reflects review and annotations entered in association with encounter. Interpretation of such information and data should be left to medically-trained personnel. Information provided to patient can be located elsewhere in the medical record under "Patient Instructions". Document created using STT-dictation technology, any transcriptional errors that may result from process are unintentional.    Contact & Pharmacy Preferred: [phone] Home: [phone] (home) Mobile: [phone] (mobile) E-mail: [email or web address], [place] - [address] [place] [address] Phone: [phone] Fax: [phone]   Pre-screening  Ms. Valere Dross offered "in-person" vs "virtual" encounter. She indicated preferring virtual for this encounter.   Reason COVID-19*  Social distancing based on CDC and AMA recommendations.   I contacted Cassandra Brown on 11/17/2021 via telephone.      I clearly identified myself as Gillis Santa, MD. I verified that I was speaking with the correct person using two identifiers (Name: NAVA SONG, and date of birth: 1966/09/02).  Consent I sought verbal advanced consent from Cassandra Brown for virtual visit interactions. I informed Ms. Wanless of possible security and privacy concerns, risks, and limitations associated with providing "not-in-person" medical evaluation and management services. I also informed Ms. Andreoni of the availability of "in-person" appointments.  Finally, I informed her that there would be a charge for the virtual visit and that she could be  personally, fully or partially, financially responsible for it. Ms. Heinrichs expressed understanding and agreed to proceed.   Historic Elements   Ms. MAKENLEIGH CROWNOVER is a 55 y.o. year old, female patient evaluated today after our last contact on 10/21/2021. Ms. Mckellips  has a past medical history of Allergic rhinitis due to pollen, Angina pectoris ([place]), CHF (congestive heart failure) ([place]), Chronic maxillary sinusitis, Chronic pain syndrome, Chronic right sacroiliac joint pain, Collagen vascular disease ([place]), Coronary artery abnormality, Dysrhythmia, Gastroparesis, GERD (gastroesophageal reflux disease), History of kidney stones, Leukocytosis, Obesity, Patent foramen ovale, Rheumatoid arthritis ([place]), Sleep apnea, Spinal stenosis of lumbar region, Unequal leg length, Vasculitis ([place]), and Wegener's disease, pulmonary. She also  has a past surgical history that includes Joint replacement (Right); Knee surgery (Left); Shoulder surgery (Bilateral); Appendectomy; Cholecystectomy; Thoracic laminectomy for spinal cord stimulator (Bilateral, 06/23/2020); Pulse generator implant (Left, 06/23/2020); Video bronchoscopy with endobronchial ultrasound (N/A, 02/27/2021); Video bronchoscopy with endobronchial navigation (N/A, 02/27/2021); and Nasal hemorrhage control (N/A, 05/13/2021). Ms. Gerhart has a current medication list which includes the following prescription(s): aspirin ec, bac, carisoprodol, cholecalciferol, clobetasol cream, dupixent, emgality, trelegy ellipta, folic acid, furosemide, gentamicin ointment, hydrocodone-acetaminophen, hydrocodone-acetaminophen, methotrexate, montelukast, naloxone, nurtec, pantoprazole, pentoxifylline, pimecrolimus, prednisone, pregabalin, promethazine, propranolol er, rosuvastatin, sodium chloride, sotalol, centrum performance, sucralfate, trelegy ellipta, triamcinolone cream, and simponi. She   reports that she quit smoking about 10 years ago. Her smoking use included cigarettes. She has a 99.00 pack-year smoking history. She has never used smokeless tobacco. She reports that she does not currently use alcohol. She reports that she does not use drugs. Ms. Garceau is allergic to cyclobenzaprine, ibuprofen, morphine, meperidine, sulfa antibiotics, and onion.   HPI  Today, she is  being contacted for a post-procedure assessment.   Post-Procedure Evaluation  Procedure (10/21/2021):  Procedure:          Anesthesia, Analgesia, Anxiolysis:  Type: LEFT Diagnostic Sacroiliac Joint Steroid Injection #2 & LEFT Piriformis TPI #2 (#1 done 09/09/21) Region: Inferior Lumbosacral Region Level: PIIS (Posterior Inferior Iliac Spine) Laterality: Left  Type: Local Anesthesia Local Anesthetic: Lidocaine 1-2%   Position: Prone           Indications: 1. Chronic sacroiliac joint pain   2. SI joint arthritis   3. Piriformis muscle pain   4. Chronic pain syndrome    Pain Score: Pre-procedure: 6 /10 Post-procedure: 5 /10      Anxiolysis: Please see nurses note.  Effectiveness during initial hour after procedure (Ultra-Short Term Relief): 60 %   Local anesthetic used: Long-acting (4-6 hours) Effectiveness: Defined as any analgesic benefit obtained secondary to the administration of local anesthetics. This carries significant diagnostic value as to the etiological location, or anatomical origin, of the pain. Duration of benefit is expected to coincide with the duration of the local anesthetic used.  Effectiveness during initial 4-6 hours after procedure (Short-Term Relief): 60 %   Long-term benefit: Defined as any relief past the pharmacologic duration of the local anesthetics.  Effectiveness past the initial 6 hours after procedure (Long-Term Relief): 60 % (lasting 3 days, then pain started returning) then reduced to 50 % for 2 weeks  Benefits, current: Defined as benefit present at the time of  this evaluation.   Analgesia:  40 % however pain is gradually returning and patient would like to repeat before Christmas    Laboratory Chemistry Profile   Renal Lab Results  Component Value Date   BUN 23 (H) 05/14/2021   CREATININE 0.77 05/14/2021   GFRAA >60 06/19/2020   GFRNONAA >60 05/14/2021    Hepatic No results found for: AST, ALT, ALBUMIN, ALKPHOS, HCVAB, AMYLASE, LIPASE, AMMONIA  Electrolytes Lab Results  Component Value Date   NA 143 05/14/2021   K 4.7 05/14/2021   CL 108 05/14/2021   CALCIUM 9.1 05/14/2021   MG 2.1 05/14/2021    Bone No results found for: VD25OH, VD125OH2TOT, LI1030DT1, YH8887NZ9, 25OHVITD1, 25OHVITD2, 25OHVITD3, TESTOFREE, TESTOSTERONE  Inflammation (CRP: Acute Phase) (ESR: Chronic Phase) No results found for: CRP, ESRSEDRATE, LATICACIDVEN       Note: Above Lab results reviewed.    Assessment  The primary encounter diagnosis was Chronic sacroiliac joint pain. Diagnoses of SI joint arthritis, Piriformis muscle pain, and Chronic pain syndrome were also pertinent to this visit.  Plan of Care    Ms. SHERRIANN SZUCH has a current medication list which includes the following long-term medication(s): furosemide, montelukast, pantoprazole, pregabalin, promethazine, propranolol er, rosuvastatin, sodium chloride, sotalol, sucralfate, and simponi.  REPEAT left sacroiliac joint and left piriformis injection #3 under fluoroscopy without sedation.  Orders:  Orders Placed This Encounter  Procedures   SACROILIAC JOINT INJECTION    Standing Status:   Future    Standing Expiration Date:   12/17/2021    Scheduling Instructions:     LEFT SI-J    Order Specific Question:   Where will this procedure be performed?    Answer:   [facility]   TRIGGER POINT INJECTION    Standing Status:   Future    Standing Expiration Date:   11/17/2022    Scheduling Instructions:     Type: Myoneural block (TPI) of piriformis muscle.     Side:  LEFT  Order  Specific Question:   Where will this procedure be performed?    Answer:   [facility]    Follow-up plan:   Return in about 2 weeks (around 12/01/2021) for L-SIJ + L Piriformis , without sedation.     Left L4/5 ESI #1 07/13/21, left piriformis, left sacroiliac joint injection 09/09/2021, 10/21/21     Recent Visits Date Type Provider Dept  10/21/21 Procedure visit Gillis Santa, MD [facility]  10/12/21 Office Visit Gillis Santa, MD [facility]  09/09/21 Procedure visit Gillis Santa, MD [facility]  Showing recent visits within past 90 days and meeting all other requirements Today's Visits Date Type Provider Dept  11/17/21 Office Visit Gillis Santa, MD [facility]  Showing today's visits and meeting all other requirements Future Appointments No visits were found meeting these conditions. Showing future appointments within next 90 days and meeting all other requirements I discussed the assessment and treatment plan with the patient. The patient was provided an opportunity to ask questions and all were answered. The patient agreed with the plan and demonstrated an understanding of the instructions.  Patient advised to call back or seek an in-person evaluation if the symptoms or condition worsens.  Duration of encounter: 30 minutes.  Note by: Gillis Santa, MD Date: 11/17/2021; Time: 1:09 PM

## 2021-11-25 ENCOUNTER — Encounter: Payer: Self-pay | Admitting: Student in an Organized Health Care Education/Training Program

## 2021-11-25 ENCOUNTER — Other Ambulatory Visit: Payer: Self-pay

## 2021-11-25 ENCOUNTER — Ambulatory Visit
Admission: RE | Admit: 2021-11-25 | Discharge: 2021-11-25 | Disposition: A | Discharge status: Home / Self Care | Payer: Medicare Other | Source: Ambulatory Visit | Attending: Student in an Organized Health Care Education/Training Program | Admitting: Student in an Organized Health Care Education/Training Program

## 2021-11-25 ENCOUNTER — Ambulatory Visit (HOSPITAL_BASED_OUTPATIENT_CLINIC_OR_DEPARTMENT_OTHER): Payer: Medicare Other | Admitting: Student in an Organized Health Care Education/Training Program

## 2021-11-25 DIAGNOSIS — M7918 Myalgia, other site: Secondary | ICD-10-CM

## 2021-11-25 DIAGNOSIS — M542 Cervicalgia: Secondary | ICD-10-CM | POA: Diagnosis present

## 2021-11-25 DIAGNOSIS — M461 Sacroiliitis, not elsewhere classified: Secondary | ICD-10-CM | POA: Diagnosis not present

## 2021-11-25 DIAGNOSIS — G894 Chronic pain syndrome: Secondary | ICD-10-CM | POA: Diagnosis not present

## 2021-11-25 DIAGNOSIS — G8929 Other chronic pain: Secondary | ICD-10-CM

## 2021-11-25 DIAGNOSIS — Z96651 Presence of right artificial knee joint: Secondary | ICD-10-CM | POA: Insufficient documentation

## 2021-11-25 DIAGNOSIS — M533 Sacrococcygeal disorders, not elsewhere classified: Secondary | ICD-10-CM | POA: Insufficient documentation

## 2021-11-25 DIAGNOSIS — M47818 Spondylosis without myelopathy or radiculopathy, sacral and sacrococcygeal region: Secondary | ICD-10-CM

## 2021-11-25 DIAGNOSIS — M791 Myalgia, unspecified site: Secondary | ICD-10-CM | POA: Insufficient documentation

## 2021-11-25 DIAGNOSIS — Z7952 Long term (current) use of systemic steroids: Secondary | ICD-10-CM | POA: Insufficient documentation

## 2021-11-25 DIAGNOSIS — M549 Dorsalgia, unspecified: Secondary | ICD-10-CM | POA: Diagnosis present

## 2021-11-25 DIAGNOSIS — Z79899 Other long term (current) drug therapy: Secondary | ICD-10-CM | POA: Insufficient documentation

## 2021-11-25 MED ORDER — METHYLPREDNISOLONE ACETATE 80 MG/ML IJ SUSP
80.0000 mg | Freq: Once | INTRAMUSCULAR | Status: AC
  Administered 2021-11-25: 80 mg via INTRA_ARTICULAR
  Filled 2021-11-25: qty 1

## 2021-11-25 MED ORDER — LIDOCAINE HCL 2 % IJ SOLN
20.0000 mL | Freq: Once | INTRAMUSCULAR | Status: AC
  Administered 2021-11-25: 400 mg
  Filled 2021-11-25: qty 10

## 2021-11-25 MED ORDER — IOHEXOL 180 MG/ML  SOLN
10.0000 mL | Freq: Once | INTRAMUSCULAR | Status: AC
  Administered 2021-11-25: 5 mL via INTRA_ARTICULAR

## 2021-11-25 MED ORDER — DEXAMETHASONE SODIUM PHOSPHATE 10 MG/ML IJ SOLN
10.0000 mg | Freq: Once | INTRAMUSCULAR | Status: AC
  Administered 2021-11-25: 10 mg
  Filled 2021-11-25: qty 1

## 2021-11-25 MED ORDER — ROPIVACAINE HCL 2 MG/ML IJ SOLN
INTRAMUSCULAR | Status: AC
  Filled 2021-11-25: qty 20

## 2021-11-25 MED ORDER — ROPIVACAINE HCL 2 MG/ML IJ SOLN
9.0000 mL | Freq: Once | INTRAMUSCULAR | Status: AC
  Administered 2021-11-25: 9 mL via PERINEURAL

## 2021-11-25 NOTE — Progress Notes (Signed)
PROVIDER NOTE: Information contained herein reflects review and annotations entered in association with encounter. Interpretation of such information and data should be left to medically-trained personnel. Information provided to patient can be located elsewhere in the medical record under "Patient Instructions". Document created using STT-dictation technology, any transcriptional errors that may result from process are unintentional.    Patient: Cassandra Brown  Service Category: Procedure  Provider: Gillis Santa, MD  DOB: Jan 26, 1966  DOS: 11/25/2021  Location: [facility]  MRN: [id number]  Setting: Ambulatory - outpatient  Referring Provider: Gillis Santa, MD  Type: Established Patient  Specialty: Interventional Pain Management  PCP: Baxter Hire, MD   Primary Reason for Visit: Interventional Pain Management Treatment. CC: Back Pain (Lower left) and Neck Pain    Procedure:          Anesthesia, Analgesia, Anxiolysis:  Type: LEFT Therapeutic Sacroiliac Joint Steroid Injection #3 & LEFT Piriformis TPI #3 (#1 done 09/09/21, #2 10/21/2021) Region: Inferior Lumbosacral Region Level: PIIS (Posterior Inferior Iliac Spine) Laterality: Left  Type: Local Anesthesia Local Anesthetic: Lidocaine 1-2%   Position: Prone           Indications: 1. Chronic sacroiliac joint pain   2. SI joint arthritis   3. Piriformis muscle pain   4. Chronic pain syndrome    Pain Score: Pre-procedure: 6 /10 Post-procedure: 5  (walking and moving)/10     Pre-op H&P Assessment:  Cassandra Brown is a 55 y.o. (year old), female patient, seen today for interventional treatment. She  has a past surgical history that includes Joint replacement (Right); Knee surgery (Left); Shoulder surgery (Bilateral); Appendectomy; Cholecystectomy; Thoracic laminectomy for spinal cord stimulator (Bilateral, 06/23/2020); Pulse generator implant (Left, 06/23/2020); Video bronchoscopy with endobronchial ultrasound (N/A,  02/27/2021); Video bronchoscopy with endobronchial navigation (N/A, 02/27/2021); and Nasal hemorrhage control (N/A, 05/13/2021). Cassandra Brown has a current medication list which includes the following prescription(s): aspirin ec, bac, carisoprodol, cholecalciferol, clobetasol cream, dupixent, emgality, trelegy ellipta, folic acid, furosemide, gentamicin ointment, hydrocodone-acetaminophen, hydrocodone-acetaminophen, methotrexate, montelukast, naloxone, nurtec, pantoprazole, pentoxifylline, pimecrolimus, prednisone, pregabalin, promethazine, propranolol er, rosuvastatin, sodium chloride, sotalol, centrum performance, sucralfate, trelegy ellipta, triamcinolone cream, and simponi. Her primarily concern today is the Back Pain (Lower left) and Neck Pain  Initial Vital Signs:  Pulse/HCG Rate: 63  Temp: (!) 97 F (36.1 C) Resp: 16 BP: (!) 142/72 SpO2: 99 %  BMI: Estimated body mass index is 38.41 kg/m as calculated from the following:   Height as of this encounter: 5\' 2"  (1.575 m).   Weight as of this encounter: 210 lb (95.3 kg).  Risk Assessment: Allergies: Reviewed. She is allergic to cyclobenzaprine, ibuprofen, morphine, meperidine, sulfa antibiotics, and onion.  Allergy Precautions: None required Coagulopathies: Reviewed. None identified.  Blood-thinner therapy: None at this time Active Infection(s): Reviewed. None identified. Cassandra Brown is afebrile  Site Confirmation: Cassandra Brown was asked to confirm the procedure and laterality before marking the site Procedure checklist: Completed Consent: Before the procedure and under the influence of no sedative(s), amnesic(s), or anxiolytics, the patient was informed of the treatment options, risks and possible complications. To fulfill our ethical and legal obligations, as recommended by the American Medical Association's Code of Ethics, I have informed the patient of my clinical impression; the nature and purpose of the treatment or procedure; the risks,  benefits, and possible complications of the intervention; the alternatives, including doing nothing; the risk(s) and benefit(s) of the alternative treatment(s) or procedure(s); and the risk(s) and benefit(s) of doing nothing. The patient  was provided information about the general risks and possible complications associated with the procedure. These may include, but are not limited to: failure to achieve desired goals, infection, bleeding, organ or nerve damage, allergic reactions, paralysis, and death. In addition, the patient was informed of those risks and complications associated to the procedure, such as failure to decrease pain; infection; bleeding; organ or nerve damage with subsequent damage to sensory, motor, and/or autonomic systems, resulting in permanent pain, numbness, and/or weakness of one or several areas of the body; allergic reactions; (i.e.: anaphylactic reaction); and/or death. Furthermore, the patient was informed of those risks and complications associated with the medications. These include, but are not limited to: allergic reactions (i.e.: anaphylactic or anaphylactoid reaction(s)); adrenal axis suppression; blood sugar elevation that in diabetics may result in ketoacidosis or comma; water retention that in patients with history of congestive heart failure may result in shortness of breath, pulmonary edema, and decompensation with resultant heart failure; weight gain; swelling or edema; medication-induced neural toxicity; particulate matter embolism and blood vessel occlusion with resultant organ, and/or nervous system infarction; and/or aseptic necrosis of one or more joints. Finally, the patient was informed that Medicine is not an exact science; therefore, there is also the possibility of unforeseen or unpredictable risks and/or possible complications that may result in a catastrophic outcome. The patient indicated having understood very clearly. We have given the patient no guarantees  and we have made no promises. Enough time was given to the patient to ask questions, all of which were answered to the patient's satisfaction. Cassandra Brown has indicated that she wanted to continue with the procedure. Attestation: I, the ordering provider, attest that I have discussed with the patient the benefits, risks, side-effects, alternatives, likelihood of achieving goals, and potential problems during recovery for the procedure that I have provided informed consent. Date  Time: 11/25/2021  9:22 AM  Pre-Procedure Preparation:  Monitoring: As per clinic protocol. Respiration, ETCO2, SpO2, BP, heart rate and rhythm monitor placed and checked for adequate function Safety Precautions: Patient was assessed for positional comfort and pressure points before starting the procedure. Time-out: I initiated and conducted the "Time-out" before starting the procedure, as per protocol. The patient was asked to participate by confirming the accuracy of the "Time Out" information. Verification of the correct person, site, and procedure were performed and confirmed by me, the nursing staff, and the patient. "Time-out" conducted as per Joint Commission's Universal Protocol (UP.01.01.01). Time: 1037  Description of Procedure:          Target Area: Superior, posterior, aspect of the sacroiliac fissure Approach: Posterior, paraspinal, ipsilateral approach. Area Prepped: Entire Lower Lumbosacral Region DuraPrep (Iodine Povacrylex [0.7% available iodine] and Isopropyl Alcohol, 74% w/w) Safety Precautions: Aspiration looking for blood return was conducted prior to all injections. At no point did we inject any substances, as a needle was being advanced. No attempts were made at seeking any paresthesias. Safe injection practices and needle disposal techniques used. Medications properly checked for expiration dates. SDV (single dose vial) medications used. Description of the Procedure: Protocol guidelines were followed.  The patient was placed in position over the procedure table. The target area was identified and the area prepped in the usual manner. Skin & deeper tissues infiltrated with local anesthetic. Appropriate amount of time allowed to pass for local anesthetics to take effect. The procedure needle was advanced under fluoroscopic guidance into the sacroiliac joint until a firm endpoint was obtained. Proper needle placement secured. Negative aspiration confirmed. Solution injected  in intermittent fashion, asking for systemic symptoms every 0.5cc of injectate. The needles were then removed and the area cleansed, making sure to leave some of the prepping solution back to take advantage of its long term bactericidal properties. Vitals:   11/25/21 1020 11/25/21 1030 11/25/21 1040 11/25/21 1044  BP: 131/62 128/61 130/62 124/68  Pulse: 61 (!) 59 (!) 56 60  Resp: (!) 21 15 17 16   Temp:      TempSrc:      SpO2: 99% 98% 97% 98%  Weight:      Height:        Start Time: 1037 hrs. End Time: 1043 hrs. Materials:  Needle(s) Type: Spinal Needle Gauge: 22G Length: 3.5-in Medication(s): Please see orders for medications and dosing details.  5 cc solution made of 4 cc of 0.2% ropivacaine, 1 cc of methylprednisolone, 80 mg/cc.  Injected into the left sacroiliac joint  Afterwards a LEFT piriformis trigger point injection was done 1 cm inferior, 1 cm deep, 1 cm lateral to the inferior fissure of the SI joint.  Contrast was injected to confirm piriformis muscle striation.  While injecting, patient did not complain of any pain radiating down her leg. 5 cc solution made of 4 cc of 0.2% ropivacaine, 1 cc of Decadron 10 mg/cc.  This was injected into the left piriformis muscle.  Imaging Guidance (Non-Spinal):          Type of Imaging Technique: Fluoroscopy Guidance (Non-Spinal) Indication(s): Assistance in needle guidance and placement for procedures requiring needle placement in or near specific anatomical locations not  easily accessible without such assistance. Exposure Time: Please see nurses notes. Contrast: Before injecting any contrast, we confirmed that the patient did not have an allergy to iodine, shellfish, or radiological contrast. Once satisfactory needle placement was completed at the desired level, radiological contrast was injected. Contrast injected under live fluoroscopy. No contrast complications. See chart for type and volume of contrast used. Fluoroscopic Guidance: I was personally present during the use of fluoroscopy. "Tunnel Vision Technique" used to obtain the best possible view of the target area. Parallax error corrected before commencing the procedure. "Direction-depth-direction" technique used to introduce the needle under continuous pulsed fluoroscopy. Once target was reached, antero-posterior, oblique, and lateral fluoroscopic projection used confirm needle placement in all planes. Images permanently stored in EMR. Interpretation: I personally interpreted the imaging intraoperatively. Adequate needle placement confirmed in multiple planes. Appropriate spread of contrast into desired area was observed. No evidence of afferent or efferent intravascular uptake. Permanent images saved into the patient's record.   Post-operative Assessment:  Post-procedure Vital Signs:  Pulse/HCG Rate: 60 (Sb)  Temp:  (!) 97 F (36.1 C) Resp: 16 BP: 124/68 SpO2: 98 %  EBL: None  Complications: No immediate post-treatment complications observed by team, or reported by patient.  Note: The patient tolerated the entire procedure well. A repeat set of vitals were taken after the procedure and the patient was kept under observation following institutional policy, for this type of procedure. Post-procedural neurological assessment was performed, showing return to baseline, prior to discharge. The patient was provided with post-procedure discharge instructions, including a section on how to identify potential  problems. Should any problems arise concerning this procedure, the patient was given instructions to immediately contact us, at any time, without hesitation. In any case, we plan to contact the patient by telephone for a follow-up status report regarding this interventional procedure.  Comments:  No additional relevant information.  Plan of Care  Orders:  Orders  Placed This Encounter  Procedures   DG PAIN CLINIC C-ARM 1-60 MIN NO REPORT    Intraoperative interpretation by procedural physician at [place].    Standing Status:   Standing    Number of Occurrences:   1    Order Specific Question:   Reason for exam:    Answer:   Assistance in needle guidance and placement for procedures requiring needle placement in or near specific anatomical locations not easily accessible without such assistance.     Medications ordered for procedure: Meds ordered this encounter  Medications   iohexol (OMNIPAQUE) 180 MG/ML injection 10 mL    Must be Myelogram-compatible. If not available, you may substitute with a water-soluble, non-ionic, hypoallergenic, myelogram-compatible radiological contrast medium.   lidocaine (XYLOCAINE) 2 % (with pres) injection 400 mg   dexamethasone (DECADRON) injection 10 mg   ropivacaine (PF) 2 mg/mL (0.2%) (NAROPIN) injection 9 mL   methylPREDNISolone acetate (DEPO-MEDROL) injection 80 mg     Medications administered: We administered iohexol, lidocaine, dexamethasone, ropivacaine (PF) 2 mg/mL (0.2%), and methylPREDNISolone acetate.  See the medical record for exact dosing, route, and time of administration.  Follow-up plan:   Return in about 8 weeks (around 01/20/2022) for Post Procedure Evaluation, virtual.       Left L4/5 ESI #1 07/13/21, left piriformis, left sacroiliac joint injection 09/09/2021, 10/21/21, 11/25/21     Recent Visits Date Type Provider Dept  11/17/21 Office Visit Gillis Santa, MD [facility]  10/21/21 Procedure visit  Gillis Santa, MD [facility]  10/12/21 Office Visit Gillis Santa, MD [facility]  09/09/21 Procedure visit Gillis Santa, MD [facility]  Showing recent visits within past 90 days and meeting all other requirements Today's Visits Date Type Provider Dept  11/25/21 Procedure visit Gillis Santa, MD [facility]  Showing today's visits and meeting all other requirements Future Appointments Date Type Provider Dept  01/20/22 Appointment Gillis Santa, MD [facility]  Showing future appointments within next 90 days and meeting all other requirements Disposition: Discharge home  Discharge (Date  Time): 11/25/2021; 1045 hrs.   Primary Care Physician: Baxter Hire, MD Location: [facility] Note by: Gillis Santa, MD Date: 11/25/2021; Time: 1:51 PM  Disclaimer:  Medicine is not an exact science. The only guarantee in medicine is that nothing is guaranteed. It is important to note that the decision to proceed with this intervention was based on the information collected from the patient. The Data and conclusions were drawn from the patient's questionnaire, the interview, and the physical examination. Because the information was provided in large part by the patient, it cannot be guaranteed that it has not been purposely or unconsciously manipulated. Every effort has been made to obtain as much relevant data as possible for this evaluation. It is important to note that the conclusions that lead to this procedure are derived in large part from the available data. Always take into account that the treatment will also be dependent on availability of resources and existing treatment guidelines, considered by other Pain Management Practitioners as being common knowledge and practice, at the time of the intervention. For Medico-Legal purposes, it is also important to point out that variation in procedural techniques and  pharmacological choices are the acceptable norm. The indications, contraindications, technique, and results of the above procedure should only be interpreted and judged by a Board-Certified Interventional Pain Specialist with extensive familiarity and expertise in the same exact procedure and technique.

## 2021-11-25 NOTE — Progress Notes (Signed)
Safety precautions to be maintained throughout the outpatient stay will include: orient to surroundings, keep bed in low position, maintain call bell within reach at all times, provide assistance with transfer out of bed and ambulation.  

## 2021-11-26 ENCOUNTER — Telehealth: Payer: Self-pay | Admitting: *Deleted

## 2021-11-26 NOTE — Telephone Encounter (Signed)
No problems post procedure. 

## 2021-12-11 ENCOUNTER — Other Ambulatory Visit: Payer: Self-pay | Admitting: Pulmonary Disease

## 2021-12-11 DIAGNOSIS — J849 Interstitial pulmonary disease, unspecified: Secondary | ICD-10-CM

## 2021-12-17 ENCOUNTER — Ambulatory Visit
Admission: RE | Admit: 2021-12-17 | Discharge: 2021-12-17 | Disposition: A | Discharge status: Home / Self Care | Payer: Medicare Other | Source: Ambulatory Visit | Attending: Pulmonary Disease | Admitting: Pulmonary Disease

## 2021-12-17 ENCOUNTER — Other Ambulatory Visit: Payer: Self-pay

## 2021-12-17 DIAGNOSIS — J849 Interstitial pulmonary disease, unspecified: Secondary | ICD-10-CM | POA: Insufficient documentation

## 2021-12-27 DIAGNOSIS — U071 COVID-19: Secondary | ICD-10-CM

## 2021-12-27 DIAGNOSIS — I82402 Acute embolism and thrombosis of unspecified deep veins of left lower extremity: Secondary | ICD-10-CM

## 2021-12-27 DIAGNOSIS — Z86718 Personal history of other venous thrombosis and embolism: Secondary | ICD-10-CM

## 2021-12-27 HISTORY — DX: Acute embolism and thrombosis of unspecified deep veins of left lower extremity: I82.402

## 2021-12-27 HISTORY — DX: COVID-19: U07.1

## 2021-12-27 HISTORY — DX: Personal history of other venous thrombosis and embolism: Z86.718

## 2022-01-01 ENCOUNTER — Emergency Department
Admission: EM | Admit: 2022-01-01 | Discharge: 2022-01-01 | Discharge status: Against Medical Advice | Payer: Medicare Other | Attending: Emergency Medicine | Admitting: Emergency Medicine

## 2022-01-01 ENCOUNTER — Other Ambulatory Visit: Payer: Self-pay

## 2022-01-01 DIAGNOSIS — R079 Chest pain, unspecified: Secondary | ICD-10-CM | POA: Insufficient documentation

## 2022-01-01 DIAGNOSIS — Z5321 Procedure and treatment not carried out due to patient leaving prior to being seen by health care provider: Secondary | ICD-10-CM | POA: Diagnosis not present

## 2022-01-01 DIAGNOSIS — R197 Diarrhea, unspecified: Secondary | ICD-10-CM | POA: Insufficient documentation

## 2022-01-01 DIAGNOSIS — R208 Other disturbances of skin sensation: Secondary | ICD-10-CM | POA: Insufficient documentation

## 2022-01-01 LAB — COMPREHENSIVE METABOLIC PANEL
ALT: 22 U/L (ref 0–44)
AST: 29 U/L (ref 15–41)
Albumin: 3.6 g/dL (ref 3.5–5.0)
Alkaline Phosphatase: 54 U/L (ref 38–126)
Anion gap: 9 (ref 5–15)
BUN: 8 mg/dL (ref 6–20)
CO2: 28 mmol/L (ref 22–32)
Calcium: 8.9 mg/dL (ref 8.9–10.3)
Chloride: 100 mmol/L (ref 98–111)
Creatinine, Ser: 0.81 mg/dL (ref 0.44–1.00)
GFR, Estimated: >60 mL/min (ref >60)
Glucose, Bld: 98 mg/dL (ref 70–99)
Potassium: 3.2 mmol/L — ABNORMAL LOW (ref 3.5–5.1)
Sodium: 137 mmol/L (ref 135–145)
Total Bilirubin: 0.6 mg/dL (ref 0.3–1.2)
Total Protein: 7.5 g/dL (ref 6.5–8.1)

## 2022-01-01 LAB — CBC
HCT: 39.5 % (ref 36.0–46.0)
Hemoglobin: 13.2 g/dL (ref 12.0–15.0)
MCH: 28.5 pg (ref 26.0–34.0)
MCHC: 33.4 g/dL (ref 30.0–36.0)
MCV: 85.3 fL (ref 80.0–100.0)
Platelets: 282 10*3/uL (ref 150–400)
RBC: 4.63 MIL/uL (ref 3.87–5.11)
RDW: 13.7 % (ref 11.5–15.5)
WBC: 4.5 10*3/uL (ref 4.0–10.5)
nRBC: 0 % (ref 0.0–0.2)

## 2022-01-01 LAB — TROPONIN I (HIGH SENSITIVITY): Troponin I (High Sensitivity): 14 ng/L (ref <18)

## 2022-01-01 LAB — LIPASE, BLOOD: Lipase: 61 U/L — ABNORMAL HIGH (ref 11–51)

## 2022-01-01 MED ORDER — ACETAMINOPHEN 325 MG PO TABS
650.0000 mg | ORAL_TABLET | Freq: Once | ORAL | Status: AC
Start: 2022-01-01 — End: 2022-01-01
  Administered 2022-01-01: 650 mg via ORAL

## 2022-01-01 NOTE — ED Triage Notes (Signed)
First Nurse Note:  Arrives from [facility] for ED evaluation of diarrhea x 10 days.  Patient is AAOx3.  Skin warm and dry. NAD

## 2022-01-01 NOTE — ED Provider Triage Note (Signed)
Emergency Medicine Provider Triage Evaluation Note  Cassandra Brown , a 56 y.o. female  was evaluated in triage.  Pt complains of chest pain, epigastric pain, emesis, diarrhea.  Patient has had symptoms starting 12/27 and have persisted since.  Decreased fluid intake but patient is still drinking.  No reported fevers, chills, nasal congestion, sore throat or cough.  GI symptoms started before chest pain.  Patient denies any pain radiating down the arms, into the neck or jaw.  Has been taking Phenergan, Pepto-Bismol, Gas-X for symptom relief..  Review of Systems  Positive: Epigastric pain, chest pain, vomiting, diarrhea Negative: Fever, nasal congestion, sore throat, cough, hematic emesis, bilious emesis, hematochezia  Physical Exam  Ht 5\' 2"  (1.575 m)    BMI 38.41 kg/m  Gen:   Awake, no distress   Resp:  Normal effort  MSK:   Moves extremities without difficulty  Other:  Bowel sounds x4 quadrants.  Slight tenderness in the epigastric region to palpation.  Medical Decision Making  Medically screening exam initiated at 5:42 PM.  Appropriate orders placed.  KAMALEI ROEDER was informed that the remainder of the evaluation will be completed by another provider, this initial triage assessment does not replace that evaluation, and the importance of remaining in the ED until their evaluation is complete.  Patient presents with epigastric and chest pain since 12/27.  GI symptoms began before chest pain.  Patient has had emesis and diarrhea.  Decreased appetite, decreased fluid intake.  Patient has had a cholecystectomy and appendectomy.  Patient will have labs, urine, troponin, chest x-ray and EKG at this time.   Darletta Moll, PA-C 01/01/22 1742

## 2022-01-01 NOTE — ED Notes (Signed)
Family member to front desk stating that pt is leaving.

## 2022-01-01 NOTE — ED Triage Notes (Signed)
Pt to ER via POV with complaints of centralized non-radiating chest pain and epigastric pain. Also reports diarrhea and nausea. Reports symptoms started 12/27.

## 2022-01-04 ENCOUNTER — Emergency Department: Payer: Medicare Other

## 2022-01-04 ENCOUNTER — Other Ambulatory Visit: Payer: Self-pay

## 2022-01-04 DIAGNOSIS — U071 COVID-19: Secondary | ICD-10-CM | POA: Diagnosis present

## 2022-01-04 DIAGNOSIS — G894 Chronic pain syndrome: Secondary | ICD-10-CM | POA: Diagnosis present

## 2022-01-04 DIAGNOSIS — I5032 Chronic diastolic (congestive) heart failure: Secondary | ICD-10-CM | POA: Diagnosis present

## 2022-01-04 DIAGNOSIS — E785 Hyperlipidemia, unspecified: Secondary | ICD-10-CM | POA: Diagnosis present

## 2022-01-04 DIAGNOSIS — Z885 Allergy status to narcotic agent status: Secondary | ICD-10-CM

## 2022-01-04 DIAGNOSIS — M5416 Radiculopathy, lumbar region: Secondary | ICD-10-CM | POA: Diagnosis present

## 2022-01-04 DIAGNOSIS — I25119 Atherosclerotic heart disease of native coronary artery with unspecified angina pectoris: Secondary | ICD-10-CM | POA: Diagnosis present

## 2022-01-04 DIAGNOSIS — M069 Rheumatoid arthritis, unspecified: Secondary | ICD-10-CM | POA: Diagnosis present

## 2022-01-04 DIAGNOSIS — Z79891 Long term (current) use of opiate analgesic: Secondary | ICD-10-CM

## 2022-01-04 DIAGNOSIS — Z87891 Personal history of nicotine dependence: Secondary | ICD-10-CM

## 2022-01-04 DIAGNOSIS — M159 Polyosteoarthritis, unspecified: Secondary | ICD-10-CM | POA: Diagnosis present

## 2022-01-04 DIAGNOSIS — I739 Peripheral vascular disease, unspecified: Secondary | ICD-10-CM | POA: Diagnosis present

## 2022-01-04 DIAGNOSIS — I700 Atherosclerosis of aorta: Secondary | ICD-10-CM | POA: Diagnosis present

## 2022-01-04 DIAGNOSIS — Z7982 Long term (current) use of aspirin: Secondary | ICD-10-CM

## 2022-01-04 DIAGNOSIS — Z882 Allergy status to sulfonamides status: Secondary | ICD-10-CM

## 2022-01-04 DIAGNOSIS — Z95818 Presence of other cardiac implants and grafts: Secondary | ICD-10-CM

## 2022-01-04 DIAGNOSIS — Z79631 Long term (current) use of antimetabolite agent: Secondary | ICD-10-CM

## 2022-01-04 DIAGNOSIS — J9601 Acute respiratory failure with hypoxia: Secondary | ICD-10-CM | POA: Diagnosis not present

## 2022-01-04 DIAGNOSIS — D649 Anemia, unspecified: Secondary | ICD-10-CM | POA: Diagnosis present

## 2022-01-04 DIAGNOSIS — M313 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: Secondary | ICD-10-CM | POA: Diagnosis present

## 2022-01-04 DIAGNOSIS — I824Z2 Acute embolism and thrombosis of unspecified deep veins of left distal lower extremity: Secondary | ICD-10-CM | POA: Diagnosis present

## 2022-01-04 DIAGNOSIS — J439 Emphysema, unspecified: Secondary | ICD-10-CM | POA: Diagnosis present

## 2022-01-04 DIAGNOSIS — K760 Fatty (change of) liver, not elsewhere classified: Secondary | ICD-10-CM | POA: Diagnosis present

## 2022-01-04 DIAGNOSIS — I482 Chronic atrial fibrillation, unspecified: Secondary | ICD-10-CM | POA: Diagnosis present

## 2022-01-04 DIAGNOSIS — E669 Obesity, unspecified: Secondary | ICD-10-CM | POA: Diagnosis present

## 2022-01-04 DIAGNOSIS — J1282 Pneumonia due to coronavirus disease 2019: Secondary | ICD-10-CM | POA: Diagnosis present

## 2022-01-04 DIAGNOSIS — Z888 Allergy status to other drugs, medicaments and biological substances status: Secondary | ICD-10-CM

## 2022-01-04 DIAGNOSIS — K219 Gastro-esophageal reflux disease without esophagitis: Secondary | ICD-10-CM | POA: Diagnosis present

## 2022-01-04 DIAGNOSIS — A4189 Other specified sepsis: Secondary | ICD-10-CM | POA: Diagnosis not present

## 2022-01-04 DIAGNOSIS — E876 Hypokalemia: Secondary | ICD-10-CM | POA: Diagnosis present

## 2022-01-04 DIAGNOSIS — J45909 Unspecified asthma, uncomplicated: Secondary | ICD-10-CM | POA: Diagnosis present

## 2022-01-04 DIAGNOSIS — G4730 Sleep apnea, unspecified: Secondary | ICD-10-CM | POA: Diagnosis present

## 2022-01-04 DIAGNOSIS — Z79899 Other long term (current) drug therapy: Secondary | ICD-10-CM

## 2022-01-04 DIAGNOSIS — Z7952 Long term (current) use of systemic steroids: Secondary | ICD-10-CM

## 2022-01-04 DIAGNOSIS — Z6836 Body mass index (BMI) 36.0-36.9, adult: Secondary | ICD-10-CM

## 2022-01-04 DIAGNOSIS — R040 Epistaxis: Secondary | ICD-10-CM | POA: Diagnosis present

## 2022-01-04 LAB — PROTIME-INR
INR: 1.1 (ref 0.8–1.2)
Prothrombin Time: 13.9 seconds (ref 11.4–15.2)

## 2022-01-04 LAB — COMPREHENSIVE METABOLIC PANEL
ALT: 35 U/L (ref 0–44)
AST: 59 U/L — ABNORMAL HIGH (ref 15–41)
Albumin: 3.5 g/dL (ref 3.5–5.0)
Alkaline Phosphatase: 61 U/L (ref 38–126)
Anion gap: 14 (ref 5–15)
BUN: 10 mg/dL (ref 6–20)
CO2: 26 mmol/L (ref 22–32)
Calcium: 8.6 mg/dL — ABNORMAL LOW (ref 8.9–10.3)
Chloride: 96 mmol/L — ABNORMAL LOW (ref 98–111)
Creatinine, Ser: 0.8 mg/dL (ref 0.44–1.00)
GFR, Estimated: >60 mL/min (ref >60)
Glucose, Bld: 88 mg/dL (ref 70–99)
Potassium: 3.3 mmol/L — ABNORMAL LOW (ref 3.5–5.1)
Sodium: 136 mmol/L (ref 135–145)
Total Bilirubin: 0.8 mg/dL (ref 0.3–1.2)
Total Protein: 7.2 g/dL (ref 6.5–8.1)

## 2022-01-04 LAB — CBC WITH DIFFERENTIAL/PLATELET
Abs Immature Granulocytes: 0.02 10*3/uL (ref 0.00–0.07)
Basophils Absolute: 0 10*3/uL (ref 0.0–0.1)
Basophils Relative: 0 %
Eosinophils Absolute: 0 10*3/uL (ref 0.0–0.5)
Eosinophils Relative: 1 %
HCT: 40.4 % (ref 36.0–46.0)
Hemoglobin: 13.4 g/dL (ref 12.0–15.0)
Immature Granulocytes: 1 %
Lymphocytes Relative: 15 %
Lymphs Abs: 0.7 10*3/uL (ref 0.7–4.0)
MCH: 28.2 pg (ref 26.0–34.0)
MCHC: 33.2 g/dL (ref 30.0–36.0)
MCV: 84.9 fL (ref 80.0–100.0)
Monocytes Absolute: 0.4 10*3/uL (ref 0.1–1.0)
Monocytes Relative: 10 %
Neutro Abs: 3.1 10*3/uL (ref 1.7–7.7)
Neutrophils Relative %: 73 %
Platelets: 301 10*3/uL (ref 150–400)
RBC: 4.76 MIL/uL (ref 3.87–5.11)
RDW: 13.7 % (ref 11.5–15.5)
WBC: 4.2 10*3/uL (ref 4.0–10.5)
nRBC: 0 % (ref 0.0–0.2)

## 2022-01-04 LAB — RESP PANEL BY RT-PCR (FLU A&B, COVID) ARPGX2
Influenza A by PCR: NEGATIVE
Influenza B by PCR: NEGATIVE
SARS Coronavirus 2 by RT PCR: POSITIVE — AB

## 2022-01-04 LAB — LACTIC ACID, PLASMA: Lactic Acid, Venous: 1.6 mmol/L (ref 0.5–1.9)

## 2022-01-04 LAB — LIPASE, BLOOD: Lipase: 58 U/L — ABNORMAL HIGH (ref 11–51)

## 2022-01-04 MED ORDER — SODIUM CHLORIDE 0.9 % IV BOLUS
1000.0000 mL | Freq: Once | INTRAVENOUS | Status: AC
  Administered 2022-01-04: 1000 mL via INTRAVENOUS

## 2022-01-04 NOTE — ED Provider Triage Note (Signed)
Emergency Medicine Provider Triage Evaluation Note  Cassandra Brown , a 56 y.o. female  was evaluated in triage.  Pt complains of shortness of breath and epigastric pain.. Sent from [facility] due to hypoxia and hypotension. She is not on oxygen at home.  Review of Systems  Positive: Shortness of breath, epigastric pain Negative: Chest pain  Physical Exam  BP 104/66    Pulse 94    Temp 98.2 F (36.8 C) (Oral)    Resp (!) 22    Ht 5\' 2"  (1.575 m)    Wt 79.4 kg    SpO2 99%    BMI 32.01 kg/m  Gen:   Awake, no distress   Resp:  Normal effort  MSK:   Moves extremities without difficulty  Other:    Medical Decision Making  Medically screening exam initiated at 2:43 PM.  Appropriate orders placed.  Cassandra Brown was informed that the remainder of the evaluation will be completed by another provider, this initial triage assessment does not replace that evaluation, and the importance of remaining in the ED until their evaluation is complete.   Victorino Dike, FNP 01/04/22 1445

## 2022-01-04 NOTE — ED Triage Notes (Signed)
Pt to ED from [facility] for fever, hypoxic, hypotension for past 10 days. LWBS on 1/6. Pt diaphoretic in triage, afebrile. Also reports epigastric pain for 10 days with n/v  Brought to ER from [facility] on 3 L [place]. Does not wear O2 at home.

## 2022-01-05 ENCOUNTER — Encounter: Payer: Self-pay | Admitting: Family Medicine

## 2022-01-05 ENCOUNTER — Emergency Department: Payer: Medicare Other

## 2022-01-05 ENCOUNTER — Inpatient Hospital Stay
Admission: EM | Admit: 2022-01-05 | Discharge: 2022-01-12 | DRG: 871 | Disposition: A | Discharge status: Home / Self Care | Payer: Medicare Other | Attending: Internal Medicine | Admitting: Internal Medicine

## 2022-01-05 DIAGNOSIS — U071 COVID-19: Secondary | ICD-10-CM

## 2022-01-05 DIAGNOSIS — J45909 Unspecified asthma, uncomplicated: Secondary | ICD-10-CM | POA: Diagnosis present

## 2022-01-05 DIAGNOSIS — I700 Atherosclerosis of aorta: Secondary | ICD-10-CM | POA: Diagnosis present

## 2022-01-05 DIAGNOSIS — M313 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: Secondary | ICD-10-CM | POA: Diagnosis present

## 2022-01-05 DIAGNOSIS — M159 Polyosteoarthritis, unspecified: Secondary | ICD-10-CM | POA: Diagnosis present

## 2022-01-05 DIAGNOSIS — F1190 Opioid use, unspecified, uncomplicated: Secondary | ICD-10-CM

## 2022-01-05 DIAGNOSIS — K760 Fatty (change of) liver, not elsewhere classified: Secondary | ICD-10-CM | POA: Diagnosis present

## 2022-01-05 DIAGNOSIS — J9601 Acute respiratory failure with hypoxia: Secondary | ICD-10-CM

## 2022-01-05 DIAGNOSIS — J1282 Pneumonia due to coronavirus disease 2019: Secondary | ICD-10-CM

## 2022-01-05 DIAGNOSIS — I482 Chronic atrial fibrillation, unspecified: Secondary | ICD-10-CM | POA: Diagnosis present

## 2022-01-05 DIAGNOSIS — E785 Hyperlipidemia, unspecified: Secondary | ICD-10-CM

## 2022-01-05 DIAGNOSIS — J439 Emphysema, unspecified: Secondary | ICD-10-CM | POA: Diagnosis present

## 2022-01-05 DIAGNOSIS — I739 Peripheral vascular disease, unspecified: Secondary | ICD-10-CM

## 2022-01-05 DIAGNOSIS — K219 Gastro-esophageal reflux disease without esophagitis: Secondary | ICD-10-CM

## 2022-01-05 DIAGNOSIS — G894 Chronic pain syndrome: Secondary | ICD-10-CM | POA: Diagnosis present

## 2022-01-05 DIAGNOSIS — R7989 Other specified abnormal findings of blood chemistry: Secondary | ICD-10-CM | POA: Diagnosis present

## 2022-01-05 DIAGNOSIS — I5032 Chronic diastolic (congestive) heart failure: Secondary | ICD-10-CM | POA: Diagnosis present

## 2022-01-05 DIAGNOSIS — M069 Rheumatoid arthritis, unspecified: Secondary | ICD-10-CM | POA: Diagnosis present

## 2022-01-05 DIAGNOSIS — I25119 Atherosclerotic heart disease of native coronary artery with unspecified angina pectoris: Secondary | ICD-10-CM | POA: Diagnosis present

## 2022-01-05 DIAGNOSIS — R1013 Epigastric pain: Secondary | ICD-10-CM | POA: Diagnosis present

## 2022-01-05 DIAGNOSIS — J9600 Acute respiratory failure, unspecified whether with hypoxia or hypercapnia: Secondary | ICD-10-CM | POA: Diagnosis present

## 2022-01-05 DIAGNOSIS — I824Z2 Acute embolism and thrombosis of unspecified deep veins of left distal lower extremity: Secondary | ICD-10-CM | POA: Diagnosis present

## 2022-01-05 DIAGNOSIS — E669 Obesity, unspecified: Secondary | ICD-10-CM | POA: Diagnosis present

## 2022-01-05 DIAGNOSIS — D649 Anemia, unspecified: Secondary | ICD-10-CM | POA: Diagnosis present

## 2022-01-05 DIAGNOSIS — Z95818 Presence of other cardiac implants and grafts: Secondary | ICD-10-CM | POA: Diagnosis not present

## 2022-01-05 DIAGNOSIS — A4189 Other specified sepsis: Principal | ICD-10-CM

## 2022-01-05 DIAGNOSIS — M5416 Radiculopathy, lumbar region: Secondary | ICD-10-CM | POA: Diagnosis present

## 2022-01-05 DIAGNOSIS — E876 Hypokalemia: Secondary | ICD-10-CM | POA: Clinically undetermined

## 2022-01-05 DIAGNOSIS — G8929 Other chronic pain: Secondary | ICD-10-CM | POA: Diagnosis present

## 2022-01-05 DIAGNOSIS — I82402 Acute embolism and thrombosis of unspecified deep veins of left lower extremity: Secondary | ICD-10-CM | POA: Diagnosis not present

## 2022-01-05 DIAGNOSIS — A419 Sepsis, unspecified organism: Secondary | ICD-10-CM | POA: Diagnosis present

## 2022-01-05 DIAGNOSIS — R6520 Severe sepsis without septic shock: Secondary | ICD-10-CM

## 2022-01-05 DIAGNOSIS — R040 Epistaxis: Secondary | ICD-10-CM | POA: Diagnosis present

## 2022-01-05 LAB — PROCALCITONIN: Procalcitonin: 129.54 ng/mL

## 2022-01-05 LAB — APTT: aPTT: 42 seconds — ABNORMAL HIGH (ref 24–36)

## 2022-01-05 LAB — FERRITIN: Ferritin: 634 ng/mL — ABNORMAL HIGH (ref 11–307)

## 2022-01-05 LAB — LACTATE DEHYDROGENASE: LDH: 260 U/L — ABNORMAL HIGH (ref 98–192)

## 2022-01-05 LAB — TROPONIN I (HIGH SENSITIVITY)
Troponin I (High Sensitivity): 18 ng/L — ABNORMAL HIGH (ref <18)
Troponin I (High Sensitivity): 22 ng/L — ABNORMAL HIGH (ref <18)
Troponin I (High Sensitivity): 20 ng/L — ABNORMAL HIGH (ref <18)
Troponin I (High Sensitivity): 16 ng/L (ref <18)

## 2022-01-05 LAB — LACTIC ACID, PLASMA: Lactic Acid, Venous: 1.3 mmol/L (ref 0.5–1.9)

## 2022-01-05 LAB — CBG MONITORING, ED: Glucose-Capillary: 170 mg/dL — ABNORMAL HIGH (ref 70–99)

## 2022-01-05 LAB — PROTIME-INR
INR: 1.2 (ref 0.8–1.2)
Prothrombin Time: 15.1 seconds (ref 11.4–15.2)

## 2022-01-05 LAB — C-REACTIVE PROTEIN: CRP: 19.9 mg/dL — ABNORMAL HIGH (ref <1.0)

## 2022-01-05 LAB — FIBRINOGEN: Fibrinogen: 642 mg/dL — ABNORMAL HIGH (ref 210–475)

## 2022-01-05 LAB — D-DIMER, QUANTITATIVE: D-Dimer, Quant: 1.98 ug/mL-FEU — ABNORMAL HIGH (ref 0.00–0.50)

## 2022-01-05 MED ORDER — MAGNESIUM HYDROXIDE 400 MG/5ML PO SUSP: 30.0000 mL | Freq: Every day | ORAL | Status: DC | PRN

## 2022-01-05 MED ORDER — SODIUM CHLORIDE 0.9 % IV SOLN
1.0000 g | Freq: Once | INTRAVENOUS | Status: AC
  Administered 2022-01-05: 1 g via INTRAVENOUS
  Filled 2022-01-05: qty 10

## 2022-01-05 MED ORDER — SODIUM CHLORIDE 0.9 % IV SOLN
100.0000 mg | Freq: Every day | INTRAVENOUS | Status: AC
  Administered 2022-01-06 – 2022-01-09 (×4): 100 mg via INTRAVENOUS
  Filled 2022-01-05 (×3): qty 20
  Filled 2022-01-05: qty 100

## 2022-01-05 MED ORDER — ADULT MULTIVITAMIN W/MINERALS CH
1.0000 | ORAL_TABLET | Freq: Every day | ORAL | Status: DC
  Administered 2022-01-05 – 2022-01-12 (×8): 1 via ORAL
  Filled 2022-01-05 (×8): qty 1

## 2022-01-05 MED ORDER — SODIUM CHLORIDE 0.9 % IV SOLN: INTRAVENOUS | Status: DC

## 2022-01-05 MED ORDER — LINAGLIPTIN 5 MG PO TABS
5.0000 mg | ORAL_TABLET | Freq: Every day | ORAL | Status: DC
  Administered 2022-01-05: 5 mg via ORAL
  Filled 2022-01-05: qty 1

## 2022-01-05 MED ORDER — EPINEPHRINE 0.3 MG/0.3ML IJ SOAJ
0.3000 mg | INTRAMUSCULAR | Status: DC | PRN
  Filled 2022-01-05: qty 0.6

## 2022-01-05 MED ORDER — PROPRANOLOL HCL ER 80 MG PO CP24
80.0000 mg | ORAL_CAPSULE | Freq: Every day | ORAL | Status: DC
  Administered 2022-01-05 – 2022-01-11 (×7): 80 mg via ORAL
  Filled 2022-01-05 (×9): qty 1

## 2022-01-05 MED ORDER — SODIUM CHLORIDE 0.9 % IV SOLN: 2.0000 g | INTRAVENOUS | Status: DC

## 2022-01-05 MED ORDER — ONDANSETRON HCL 4 MG PO TABS: 4.0000 mg | ORAL_TABLET | Freq: Four times a day (QID) | ORAL | Status: DC | PRN

## 2022-01-05 MED ORDER — FLUTICASONE-UMECLIDIN-VILANT 100-62.5-25 MCG/ACT IN AEPB: 1.0000 | INHALATION_SPRAY | Freq: Every day | RESPIRATORY_TRACT | Status: DC

## 2022-01-05 MED ORDER — FUROSEMIDE 20 MG PO TABS
20.0000 mg | ORAL_TABLET | Freq: Every day | ORAL | Status: DC
  Administered 2022-01-05 – 2022-01-12 (×8): 20 mg via ORAL
  Filled 2022-01-05 (×8): qty 1

## 2022-01-05 MED ORDER — SODIUM CHLORIDE 0.9 % IV SOLN
200.0000 mg | Freq: Once | INTRAVENOUS | Status: AC
  Administered 2022-01-05: 200 mg via INTRAVENOUS
  Filled 2022-01-05: qty 200

## 2022-01-05 MED ORDER — DUPILUMAB 300 MG/2ML ~~LOC~~ SOAJ: 300.0000 mg | SUBCUTANEOUS | Status: DC

## 2022-01-05 MED ORDER — ASPIRIN EC 81 MG PO TBEC
81.0000 mg | DELAYED_RELEASE_TABLET | Freq: Every day | ORAL | Status: DC
  Administered 2022-01-05 – 2022-01-12 (×8): 81 mg via ORAL
  Filled 2022-01-05 (×8): qty 1

## 2022-01-05 MED ORDER — CLOBETASOL PROPIONATE 0.05 % EX CREA
1.0000 "application " | TOPICAL_CREAM | Freq: Two times a day (BID) | CUTANEOUS | Status: DC | PRN
  Filled 2022-01-05: qty 15

## 2022-01-05 MED ORDER — INSULIN DETEMIR 100 UNIT/ML ~~LOC~~ SOLN
0.0750 [IU]/kg | Freq: Two times a day (BID) | SUBCUTANEOUS | Status: DC
  Filled 2022-01-05 (×2): qty 0.06

## 2022-01-05 MED ORDER — TIOTROPIUM BROMIDE MONOHYDRATE 18 MCG IN CAPS
1.0000 | ORAL_CAPSULE | Freq: Every day | RESPIRATORY_TRACT | Status: DC
  Administered 2022-01-05 – 2022-01-12 (×8): 18 ug via RESPIRATORY_TRACT
  Filled 2022-01-05 (×2): qty 5

## 2022-01-05 MED ORDER — FOLIC ACID 1 MG PO TABS
1.0000 mg | ORAL_TABLET | Freq: Every day | ORAL | Status: DC
  Administered 2022-01-05 – 2022-01-12 (×8): 1 mg via ORAL
  Filled 2022-01-05 (×8): qty 1

## 2022-01-05 MED ORDER — ACETAMINOPHEN 500 MG PO TABS
1000.0000 mg | ORAL_TABLET | Freq: Once | ORAL | Status: AC
  Administered 2022-01-05: 1000 mg via ORAL
  Filled 2022-01-05: qty 2

## 2022-01-05 MED ORDER — GUAIFENESIN-DM 100-10 MG/5ML PO SYRP
10.0000 mL | ORAL_SOLUTION | ORAL | Status: DC | PRN
  Administered 2022-01-06: 10 mL via ORAL
  Filled 2022-01-05 (×3): qty 10

## 2022-01-05 MED ORDER — METHOTREXATE 2.5 MG PO TABS: 10.0000 mg | ORAL_TABLET | ORAL | Status: DC

## 2022-01-05 MED ORDER — METHYLPREDNISOLONE SODIUM SUCC 125 MG IJ SOLR
125.0000 mg | Freq: Once | INTRAMUSCULAR | Status: AC
  Administered 2022-01-05: 125 mg via INTRAVENOUS
  Filled 2022-01-05: qty 2

## 2022-01-05 MED ORDER — ROSUVASTATIN CALCIUM 10 MG PO TABS
10.0000 mg | ORAL_TABLET | Freq: Every day | ORAL | Status: DC
  Administered 2022-01-05 – 2022-01-11 (×7): 10 mg via ORAL
  Filled 2022-01-05 (×8): qty 1

## 2022-01-05 MED ORDER — ONDANSETRON HCL 4 MG/2ML IJ SOLN
4.0000 mg | Freq: Once | INTRAMUSCULAR | Status: AC
Start: 2022-01-05 — End: 2022-01-05
  Administered 2022-01-05: 4 mg via INTRAVENOUS
  Filled 2022-01-05: qty 2

## 2022-01-05 MED ORDER — GENTAMICIN SULFATE 0.1 % EX OINT
1.0000 "application " | TOPICAL_OINTMENT | Freq: Every day | CUTANEOUS | Status: DC | PRN
  Filled 2022-01-05: qty 15

## 2022-01-05 MED ORDER — ACETAMINOPHEN 325 MG PO TABS
650.0000 mg | ORAL_TABLET | Freq: Four times a day (QID) | ORAL | Status: DC | PRN
  Administered 2022-01-06 – 2022-01-11 (×2): 650 mg via ORAL
  Filled 2022-01-05 (×2): qty 2

## 2022-01-05 MED ORDER — GUAIFENESIN ER 600 MG PO TB12
600.0000 mg | ORAL_TABLET | Freq: Two times a day (BID) | ORAL | Status: DC
  Administered 2022-01-05 – 2022-01-12 (×15): 600 mg via ORAL
  Filled 2022-01-05 (×15): qty 1

## 2022-01-05 MED ORDER — INSULIN ASPART 100 UNIT/ML IJ SOLN: 0.0000 [IU] | Freq: Three times a day (TID) | INTRAMUSCULAR | Status: DC

## 2022-01-05 MED ORDER — HYDROCODONE-ACETAMINOPHEN 10-325 MG PO TABS
1.0000 | ORAL_TABLET | Freq: Four times a day (QID) | ORAL | Status: DC
  Administered 2022-01-05 – 2022-01-12 (×29): 1 via ORAL
  Filled 2022-01-05 (×29): qty 1

## 2022-01-05 MED ORDER — PIMECROLIMUS 1 % EX CREA: 1.0000 "application " | TOPICAL_CREAM | Freq: Two times a day (BID) | CUTANEOUS | Status: DC | PRN

## 2022-01-05 MED ORDER — ETOMIDATE 2 MG/ML IV SOLN: 20.0000 mg | Freq: Once | INTRAVENOUS | Status: DC

## 2022-01-05 MED ORDER — METHYLPREDNISOLONE SODIUM SUCC 40 MG IJ SOLR
40.0000 mg | Freq: Two times a day (BID) | INTRAMUSCULAR | Status: DC
  Administered 2022-01-05 – 2022-01-08 (×6): 40 mg via INTRAVENOUS
  Filled 2022-01-05 (×6): qty 1

## 2022-01-05 MED ORDER — BARICITINIB 2 MG PO TABS
2.0000 mg | ORAL_TABLET | Freq: Every day | ORAL | Status: DC
  Administered 2022-01-05: 2 mg via ORAL
  Filled 2022-01-05 (×2): qty 1

## 2022-01-05 MED ORDER — AZITHROMYCIN 500 MG PO TABS
500.0000 mg | ORAL_TABLET | Freq: Once | ORAL | Status: AC
  Administered 2022-01-05: 500 mg via ORAL
  Filled 2022-01-05: qty 1

## 2022-01-05 MED ORDER — SUCRALFATE 1 G PO TABS
1.0000 g | ORAL_TABLET | Freq: Two times a day (BID) | ORAL | Status: DC
  Administered 2022-01-05 – 2022-01-12 (×15): 1 g via ORAL
  Filled 2022-01-05 (×15): qty 1

## 2022-01-05 MED ORDER — FENTANYL CITRATE PF 50 MCG/ML IJ SOSY
50.0000 ug | PREFILLED_SYRINGE | Freq: Once | INTRAMUSCULAR | Status: AC
  Administered 2022-01-05: 50 ug via INTRAVENOUS
  Filled 2022-01-05: qty 1

## 2022-01-05 MED ORDER — PENTOXIFYLLINE ER 400 MG PO TBCR
400.0000 mg | EXTENDED_RELEASE_TABLET | Freq: Three times a day (TID) | ORAL | Status: DC
  Administered 2022-01-05 (×2): 400 mg via ORAL
  Filled 2022-01-05 (×5): qty 1

## 2022-01-05 MED ORDER — TRAZODONE HCL 50 MG PO TABS
25.0000 mg | ORAL_TABLET | Freq: Every evening | ORAL | Status: DC | PRN
  Administered 2022-01-05 – 2022-01-11 (×7): 25 mg via ORAL
  Filled 2022-01-05 (×7): qty 1

## 2022-01-05 MED ORDER — NALOXONE HCL 4 MG/0.1ML NA LIQD
1.0000 | Freq: Once | NASAL | Status: DC | PRN
  Filled 2022-01-05: qty 8

## 2022-01-05 MED ORDER — LACTATED RINGERS IV BOLUS (SEPSIS)
500.0000 mL | Freq: Once | INTRAVENOUS | Status: AC
  Administered 2022-01-05: 500 mL via INTRAVENOUS

## 2022-01-05 MED ORDER — LACTATED RINGERS IV BOLUS
1000.0000 mL | Freq: Once | INTRAVENOUS | Status: AC
  Administered 2022-01-05: 1000 mL via INTRAVENOUS

## 2022-01-05 MED ORDER — LACTATED RINGERS IV SOLN: INTRAVENOUS | Status: DC

## 2022-01-05 MED ORDER — SODIUM CHLORIDE 0.9 % IV SOLN: 200.0000 mg | Freq: Once | INTRAVENOUS | Status: DC

## 2022-01-05 MED ORDER — RIMEGEPANT SULFATE 75 MG PO TBDP: 75.0000 mg | ORAL_TABLET | Freq: Every day | ORAL | Status: DC | PRN

## 2022-01-05 MED ORDER — PROMETHAZINE HCL 25 MG PO TABS
25.0000 mg | ORAL_TABLET | Freq: Four times a day (QID) | ORAL | Status: DC | PRN
  Administered 2022-01-06: 25 mg via ORAL
  Filled 2022-01-05: qty 1

## 2022-01-05 MED ORDER — SUCCINYLCHOLINE CHLORIDE 200 MG/10ML IV SOSY: 120.0000 mg | PREFILLED_SYRINGE | Freq: Once | INTRAVENOUS | Status: DC

## 2022-01-05 MED ORDER — IOHEXOL 350 MG/ML SOLN
100.0000 mL | Freq: Once | INTRAVENOUS | Status: AC | PRN
  Administered 2022-01-05: 100 mL via INTRAVENOUS

## 2022-01-05 MED ORDER — ONDANSETRON HCL 4 MG/2ML IJ SOLN
4.0000 mg | Freq: Four times a day (QID) | INTRAMUSCULAR | Status: DC | PRN
  Administered 2022-01-05 – 2022-01-10 (×2): 4 mg via INTRAVENOUS
  Filled 2022-01-05 (×3): qty 2

## 2022-01-05 MED ORDER — SODIUM CHLORIDE 0.9 % IV SOLN
2.0000 g | INTRAVENOUS | Status: AC
  Administered 2022-01-06 – 2022-01-09 (×4): 2 g via INTRAVENOUS
  Filled 2022-01-05 (×4): qty 20

## 2022-01-05 MED ORDER — VITAMIN D 25 MCG (1000 UNIT) PO TABS
1000.0000 [IU] | ORAL_TABLET | Freq: Every day | ORAL | Status: DC
  Administered 2022-01-05 – 2022-01-12 (×8): 1000 [IU] via ORAL
  Filled 2022-01-05 (×9): qty 1

## 2022-01-05 MED ORDER — SODIUM CHLORIDE 0.9 % IV SOLN
500.0000 mg | INTRAVENOUS | Status: AC
  Administered 2022-01-06 – 2022-01-09 (×4): 500 mg via INTRAVENOUS
  Filled 2022-01-05 (×4): qty 5

## 2022-01-05 MED ORDER — ENOXAPARIN SODIUM 40 MG/0.4ML IJ SOSY
40.0000 mg | PREFILLED_SYRINGE | INTRAMUSCULAR | Status: DC
  Administered 2022-01-05: 40 mg via SUBCUTANEOUS
  Filled 2022-01-05: qty 0.4

## 2022-01-05 MED ORDER — PANTOPRAZOLE SODIUM 40 MG PO TBEC
40.0000 mg | DELAYED_RELEASE_TABLET | Freq: Every day | ORAL | Status: DC
  Administered 2022-01-05 – 2022-01-06 (×2): 40 mg via ORAL
  Filled 2022-01-05 (×2): qty 1

## 2022-01-05 MED ORDER — TRIAMCINOLONE ACETONIDE 0.1 % EX CREA
1.0000 "application " | TOPICAL_CREAM | Freq: Every day | CUTANEOUS | Status: DC | PRN
  Filled 2022-01-05: qty 15

## 2022-01-05 MED ORDER — MONTELUKAST SODIUM 10 MG PO TABS
10.0000 mg | ORAL_TABLET | Freq: Every day | ORAL | Status: DC
  Administered 2022-01-05 – 2022-01-11 (×7): 10 mg via ORAL
  Filled 2022-01-05 (×7): qty 1

## 2022-01-05 MED ORDER — FLUTICASONE-UMECLIDIN-VILANT 200-62.5-25 MCG/ACT IN AEPB: 1.0000 | INHALATION_SPRAY | Freq: Every day | RESPIRATORY_TRACT | Status: DC

## 2022-01-05 MED ORDER — SODIUM CHLORIDE 0.9 % IV SOLN: 100.0000 mg | Freq: Every day | INTRAVENOUS | Status: DC

## 2022-01-05 MED ORDER — SOTALOL HCL 80 MG PO TABS
40.0000 mg | ORAL_TABLET | Freq: Two times a day (BID) | ORAL | Status: DC
  Administered 2022-01-05 – 2022-01-12 (×15): 40 mg via ORAL
  Filled 2022-01-05 (×17): qty 0.5

## 2022-01-05 MED ORDER — SALINE SPRAY 0.65 % NA SOLN
1.0000 | NASAL | Status: DC | PRN
  Filled 2022-01-05: qty 44

## 2022-01-05 MED ORDER — SODIUM CHLORIDE 0.9 % IV SOLN: 500.0000 mg | INTRAVENOUS | Status: DC

## 2022-01-05 MED ORDER — PREGABALIN 75 MG PO CAPS
75.0000 mg | ORAL_CAPSULE | Freq: Two times a day (BID) | ORAL | Status: DC
  Administered 2022-01-05 – 2022-01-12 (×15): 75 mg via ORAL
  Filled 2022-01-05 (×15): qty 1

## 2022-01-05 MED ORDER — CARISOPRODOL 350 MG PO TABS
350.0000 mg | ORAL_TABLET | Freq: Three times a day (TID) | ORAL | Status: DC
  Administered 2022-01-05 – 2022-01-12 (×21): 350 mg via ORAL
  Filled 2022-01-05 (×22): qty 1

## 2022-01-05 MED ORDER — HYDROCOD POLST-CPM POLST ER 10-8 MG/5ML PO SUER
5.0000 mL | Freq: Two times a day (BID) | ORAL | Status: DC | PRN
  Administered 2022-01-06 – 2022-01-11 (×7): 5 mL via ORAL
  Filled 2022-01-05 (×7): qty 5

## 2022-01-05 MED ORDER — IPRATROPIUM-ALBUTEROL 0.5-2.5 (3) MG/3ML IN SOLN
3.0000 mL | Freq: Once | RESPIRATORY_TRACT | Status: AC
  Administered 2022-01-05: 3 mL via RESPIRATORY_TRACT
  Filled 2022-01-05: qty 3

## 2022-01-05 NOTE — Progress Notes (Signed)
Remdesivir - Pharmacy Brief Note   O:  CXR: "...increased patchy opacities predominantly in the right mid to lower lung may reflect superimposed acute inflammation or infection." SpO2: 96-99% on 2 L/min RA   A/P:  Remdesivir 200 mg IVPB once followed by 100 mg IVPB daily x 4 days.   Renda Rolls, PharmD, MBA 01/05/2022 2:20 AM

## 2022-01-05 NOTE — ED Provider Notes (Signed)
[facility] Provider Note    Event Date/Time   First MD Initiated Contact with Patient 01/05/22 [phone]     (approximate)   History   Fever   HPI  Cassandra Brown is a 56 y.o. female with a history of CHF with preserved EF, chronic pain syndrome, rheumatoid arthritis on methotrexate and prednisone, Wegener's disease of the lung, asthma who presents for evaluation of fever.  Patient reports that she has been sick for 10 days.  Started to feel better but then 2 days ago started to feel worse again.  She is complaining of fever and chills, chest pain, shortness of breath, cough, epigastric sharp stabbing abdominal pain, nausea, nonbloody nonbilious emesis, inability to tolerate p.o.  No diarrhea.  Patient is fully vaccinated against COVID.  No prior history of PE or DVT, no recent travel immobilization, no leg pain or swelling, no hemoptysis or exogenous hormones.  Patient went to urgent care was found to be hypotensive and hypoxic and transferred to the hospital for further evaluation.     Past Medical History:  Diagnosis Date   Allergic rhinitis due to pollen    Angina pectoris (HCC)    CHF (congestive heart failure) (HCC)    Chronic maxillary sinusitis    Chronic pain syndrome    BACK   Chronic right sacroiliac joint pain    Collagen vascular disease (HCC)    Coronary artery abnormality    Dysrhythmia    AFIB,  FREQ PVC   Gastroparesis    GERD (gastroesophageal reflux disease)    History of kidney stones    Leukocytosis    Obesity    Patent foramen ovale    Rheumatoid arthritis ([place])    OSTEOARTHRITIS   Sleep apnea    Spinal stenosis of lumbar region    Unequal leg length    Vasculitis ([place])    Wegener's disease, pulmonary     Past Surgical History:  Procedure Laterality Date   APPENDECTOMY     CHOLECYSTECTOMY     JOINT REPLACEMENT Right    knee   KNEE SURGERY Left    NASAL HEMORRHAGE CONTROL N/A 05/13/2021   Procedure: EPISTAXIS CONTROL;   Surgeon: Carloyn Manner, MD;  Location: [facility];  Service: ENT;  Laterality: N/A;   PULSE GENERATOR IMPLANT Left 06/23/2020   Procedure: LEFT FLANK PULSE GENERATOR IMPLANT;  Surgeon: Deetta Perla, MD;  Location: [facility];  Service: Neurosurgery;  Laterality: Left;   SHOULDER SURGERY Bilateral    THORACIC LAMINECTOMY FOR SPINAL CORD STIMULATOR Bilateral 06/23/2020   Procedure: THORACIC SPINAL CORD STIMULATOR;  Surgeon: Deetta Perla, MD;  Location: [facility];  Service: Neurosurgery;  Laterality: Bilateral;   VIDEO BRONCHOSCOPY WITH ENDOBRONCHIAL NAVIGATION N/A 02/27/2021   Procedure: VIDEO BRONCHOSCOPY WITH ENDOBRONCHIAL NAVIGATION;  Surgeon: Ottie Glazier, MD;  Location: [facility];  Service: Thoracic;  Laterality: N/A;   VIDEO BRONCHOSCOPY WITH ENDOBRONCHIAL ULTRASOUND N/A 02/27/2021   Procedure: VIDEO BRONCHOSCOPY WITH ENDOBRONCHIAL ULTRASOUND;  Surgeon: Ottie Glazier, MD;  Location: [facility];  Service: Thoracic;  Laterality: N/A;     Physical Exam   Triage Vital Signs: ED Triage Vitals  Enc Vitals Group     BP 01/04/22 1440 104/66     Pulse Rate 01/04/22 1440 94     Resp 01/04/22 1440 (!) 22     Temp 01/04/22 1440 98.2 F (36.8 C)     Temp Source 01/04/22 1440 Oral     SpO2 01/04/22 1440 99 %  Weight 01/04/22 1442 175 lb (79.4 kg)     Height 01/04/22 1442 _0  (1.575 m)     Head Circumference --      Peak Flow --      Pain Score 01/04/22 1441 7     Pain Loc --      Pain Edu? --      Excl. in [place]? --     Most recent vital signs: Vitals:   01/05/22 0245 01/05/22 0315  BP:  100/60  Pulse: (!) 112 (!) 101  Resp: (!) 24 (!) 27  Temp:    SpO2: 97% 98%     Constitutional: Alert and oriented, looks uncomfortable, moaning and groaning  HEENT:      Head: Normocephalic and atraumatic.         Eyes: Conjunctivae are normal. Sclera is non-icteric.       Mouth/Throat: Mucous membranes are dry.       Neck: Supple with no signs of meningismus. Cardiovascular: Tachycardic with  regular rhythm  respiratory: Tachypneic, hypoxic currently on 3 L nasal cannula, lungs are clear to auscultation with mild crackles but no wheezing and good air movement  gastrointestinal: Soft, non tender, and non distended with positive bowel sounds. No rebound or guarding. Genitourinary: No CVA tenderness. Musculoskeletal:  No edema, cyanosis, or erythema of extremities. Neurologic: Normal speech and language. Face is symmetric. Moving all extremities. No gross focal neurologic deficits are appreciated. Skin: Skin is warm, dry and intact. No rash noted. Psychiatric: Mood and affect are normal. Speech and behavior are normal.  ED Results / Procedures / Treatments   Labs (all labs ordered are listed, but only abnormal results are displayed) Labs Reviewed  RESP PANEL BY RT-PCR (FLU A&B, COVID) ARPGX2 - Abnormal; Notable for the following components:      Result Value   SARS Coronavirus 2 by RT PCR POSITIVE (*)    All other components within normal limits  COMPREHENSIVE METABOLIC PANEL - Abnormal; Notable for the following components:   Potassium 3.3 (*)    Chloride 96 (*)    Calcium 8.6 (*)    AST 59 (*)    All other components within normal limits  LIPASE, BLOOD - Abnormal; Notable for the following components:   Lipase 58 (*)    All other components within normal limits  TROPONIN I (HIGH SENSITIVITY) - Abnormal; Notable for the following components:   Troponin I (High Sensitivity) 18 (*)    All other components within normal limits  CULTURE, BLOOD (ROUTINE X 2)  CULTURE, BLOOD (ROUTINE X 2) W REFLEX TO ID PANEL  LACTIC ACID, PLASMA  CBC WITH DIFFERENTIAL/PLATELET  PROTIME-INR  PROCALCITONIN  URINALYSIS, ROUTINE W REFLEX MICROSCOPIC     EKG  ED ECG REPORT I, Rudene Re, the attending physician, personally viewed and interpreted this ECG.  Sinus rhythm with a rate of 90, normal intervals, normal axis, no ST elevations or depression   RADIOLOGY I have personally  reviewed the images performed during this visit and interpret them as below:  CT c/a/p: Multifocal pneumonia, no PE, no abdominal   ___________________________________________________ Interpretation by Radiologist:  DG Chest 2 View  Result Date: 01/04/2022 CLINICAL DATA:  Suspected sepsis.  Fever, hypoxia, and hypotension. EXAM: CHEST - 2 VIEW COMPARISON:  Chest radiograph 02/27/2021 and CT 12/17/2021 FINDINGS: The cardiomediastinal silhouette is unchanged with normal heart size. Mild patchy and partly nodular opacities in the right mid and lower lung appear new from the prior radiograph although the interval  chest CT demonstrated chronic findings which could account for some but likely not all of these densities. Left lung densities likely predominantly reflect scarring although a superimposed acute process is not excluded. No pleural effusion or pneumothorax is identified. A thoracic spinal cord stimulator and right upper quadrant abdominal surgical clips are noted. IMPRESSION: Bilateral lung opacities which are at least partially chronic (likely scarring related to history of granulomatosis with polyangiitis), however increased patchy opacities predominantly in the right mid to lower lung may reflect superimposed acute inflammation or infection. Electronically Signed   By: Logan Bores M.D.   On: 01/04/2022 15:41   CT Angio Chest PE W and/or Wo Contrast  Result Date: 01/05/2022 CLINICAL DATA:  Epigastric pain. Also concern for pulmonary embolism. EXAM: CT ANGIOGRAPHY CHEST CT ABDOMEN AND PELVIS WITH CONTRAST TECHNIQUE: Multidetector CT imaging of the chest was performed using the standard protocol during bolus administration of intravenous contrast. Multiplanar CT image reconstructions and MIPs were obtained to evaluate the vascular anatomy. Multidetector CT imaging of the abdomen and pelvis was performed using the standard protocol during bolus administration of intravenous contrast. CONTRAST:  145m  OMNIPAQUE IOHEXOL 350 MG/ML SOLN COMPARISON:  CT abdomen pelvis dated 04/08/2008 and chest radiograph dated 01/04/2022. FINDINGS: Evaluation of this exam is limited due to respiratory motion artifact. CTA CHEST FINDINGS Cardiovascular: Borderline cardiomegaly. No pericardial effusion. Amplatzer occlusive device noted. Mild atherosclerotic calcification of the thoracic aorta. No aneurysmal dilatation. The origins of the great vessels of the aortic arch appear patent as visualized. Evaluation of the pulmonary arteries is very limited due to severe respiratory motion artifact and suboptimal opacification and timing of the contrast. No definite pulmonary artery embolus identified. Mediastinum/Nodes: No hilar or mediastinal adenopathy. The esophagus is grossly unremarkable. No mediastinal fluid collection. Lungs/Pleura: Bilateral streaky densities and clusters of ground-glass density throughout the lungs most consistent with multifocal pneumonia, likely viral or atypical in etiology. Clinical correlation is recommended. Probable small pleural effusion along the fissures bilaterally. No pneumothorax. The central airways are patent. There is background of mild paraseptal emphysema. Musculoskeletal: No acute osseous pathology. Lower thoracic spinal stimulator. Review of the MIP images confirms the above findings. CT ABDOMEN and PELVIS FINDINGS No intra-abdominal free air or free fluid. Hepatobiliary: Fatty liver. No intrahepatic biliary dilatation. The gallbladder is surgically absent. Pancreas: Unremarkable. No pancreatic ductal dilatation or surrounding inflammatory changes. Spleen: Normal in size without focal abnormality. Adrenals/Urinary Tract: The adrenal glands unremarkable. Small right renal inferior pole cyst. There is no hydronephrosis on either side. There is symmetric enhancement and excretion of contrast by both kidneys. The visualized ureters and urinary bladder appear unremarkable. Stomach/Bowel: There is no  bowel obstruction or active inflammation. Appendectomy. Vascular/Lymphatic: Moderate aortoiliac atherosclerotic disease. The IVC is unremarkable. No portal venous gas. There is no adenopathy. Reproductive: The uterus is grossly unremarkable. No adnexal masses. Other: None Musculoskeletal: Degenerative changes of the spine and scoliosis. No acute osseous pathology. Review of the MIP images confirms the above findings. IMPRESSION: 1. No CT evidence of pulmonary embolism. 2. Multifocal pneumonia, likely viral or atypical in etiology. 3. No acute intra-abdominal or pelvic pathology. 4. Fatty liver. 5. Aortic Atherosclerosis (ICD10-I70.0) and Emphysema (ICD10-J43.9). Electronically Signed   By: AAnner CreteM.D.   On: 01/05/2022 03:23   CT ABDOMEN PELVIS W CONTRAST  Result Date: 01/05/2022 CLINICAL DATA:  Epigastric pain. Also concern for pulmonary embolism. EXAM: CT ANGIOGRAPHY CHEST CT ABDOMEN AND PELVIS WITH CONTRAST TECHNIQUE: Multidetector CT imaging of the chest was performed using the  standard protocol during bolus administration of intravenous contrast. Multiplanar CT image reconstructions and MIPs were obtained to evaluate the vascular anatomy. Multidetector CT imaging of the abdomen and pelvis was performed using the standard protocol during bolus administration of intravenous contrast. CONTRAST:  133m OMNIPAQUE IOHEXOL 350 MG/ML SOLN COMPARISON:  CT abdomen pelvis dated 04/08/2008 and chest radiograph dated 01/04/2022. FINDINGS: Evaluation of this exam is limited due to respiratory motion artifact. CTA CHEST FINDINGS Cardiovascular: Borderline cardiomegaly. No pericardial effusion. Amplatzer occlusive device noted. Mild atherosclerotic calcification of the thoracic aorta. No aneurysmal dilatation. The origins of the great vessels of the aortic arch appear patent as visualized. Evaluation of the pulmonary arteries is very limited due to severe respiratory motion artifact and suboptimal opacification  and timing of the contrast. No definite pulmonary artery embolus identified. Mediastinum/Nodes: No hilar or mediastinal adenopathy. The esophagus is grossly unremarkable. No mediastinal fluid collection. Lungs/Pleura: Bilateral streaky densities and clusters of ground-glass density throughout the lungs most consistent with multifocal pneumonia, likely viral or atypical in etiology. Clinical correlation is recommended. Probable small pleural effusion along the fissures bilaterally. No pneumothorax. The central airways are patent. There is background of mild paraseptal emphysema. Musculoskeletal: No acute osseous pathology. Lower thoracic spinal stimulator. Review of the MIP images confirms the above findings. CT ABDOMEN and PELVIS FINDINGS No intra-abdominal free air or free fluid. Hepatobiliary: Fatty liver. No intrahepatic biliary dilatation. The gallbladder is surgically absent. Pancreas: Unremarkable. No pancreatic ductal dilatation or surrounding inflammatory changes. Spleen: Normal in size without focal abnormality. Adrenals/Urinary Tract: The adrenal glands unremarkable. Small right renal inferior pole cyst. There is no hydronephrosis on either side. There is symmetric enhancement and excretion of contrast by both kidneys. The visualized ureters and urinary bladder appear unremarkable. Stomach/Bowel: There is no bowel obstruction or active inflammation. Appendectomy. Vascular/Lymphatic: Moderate aortoiliac atherosclerotic disease. The IVC is unremarkable. No portal venous gas. There is no adenopathy. Reproductive: The uterus is grossly unremarkable. No adnexal masses. Other: None Musculoskeletal: Degenerative changes of the spine and scoliosis. No acute osseous pathology. Review of the MIP images confirms the above findings. IMPRESSION: 1. No CT evidence of pulmonary embolism. 2. Multifocal pneumonia, likely viral or atypical in etiology. 3. No acute intra-abdominal or pelvic pathology. 4. Fatty liver. 5.  Aortic Atherosclerosis (ICD10-I70.0) and Emphysema (ICD10-J43.9). Electronically Signed   By: AAnner CreteM.D.   On: 01/05/2022 03:23      PROCEDURES:  Critical Care performed: Yes, see critical care procedure note(s)  .Critical Care Performed by: VRudene Re MD Authorized by: VRudene Re MD   Critical care provider statement:    Critical care time (minutes):  40   Critical care time was exclusive of:  Separately billable procedures and treating other patients   Critical care was necessary to treat or prevent imminent or life-threatening deterioration of the following conditions:  Cardiac failure, circulatory failure, respiratory failure, sepsis, shock, dehydration, endocrine crisis and metabolic crisis   Critical care was time spent personally by me on the following activities:  Development of treatment plan with patient or surrogate, discussions with consultants, evaluation of patient's response to treatment, examination of patient, ordering and review of laboratory studies, ordering and review of radiographic studies, ordering and performing treatments and interventions, pulse oximetry, re-evaluation of patient's condition and review of old charts   I assumed direction of critical care for this patient from another provider in my specialty: no     Care discussed with: admitting provider      IMPRESSION / MDM /  ASSESSMENT AND PLAN / ED COURSE  I reviewed the triage vital signs and the nursing notes.  56 y.o. female with a history of CHF with preserved EF, chronic pain syndrome, rheumatoid arthritis on methotrexate and prednisone, Wegener's disease of the lung, asthma who presents for evaluation of fever.  Patient looks uncomfortable, moaning and groaning, looks dry on exam, hypoxic and tachypneic with increased work of breathing but clear lungs with faint crackles, currently on 3 L nasal cannula, no asymmetric leg swelling.  Patient has a fever of 102.73F, tachycardia  and tachypnea.  Abdomen is soft, nondistended and nontender  Ddx: COVID versus flu versus pneumonia versus myocarditis versus pericarditis versus PE   Plan: CT chest abdomen pelvis, CBC, CMP, lipase, urinalysis, EKG, troponin, lactic acid, blood cultures, COVID and flu swab, procalcitonin   MEDICATIONS GIVEN IN ED: Medications  remdesivir 200 mg in sodium chloride 0.9% 250 mL IVPB (has no administration in time range)    Followed by  remdesivir 100 mg in sodium chloride 0.9 % 100 mL IVPB (has no administration in time range)  cefTRIAXone (ROCEPHIN) 1 g in sodium chloride 0.9 % 100 mL IVPB (has no administration in time range)  azithromycin (ZITHROMAX) tablet 500 mg (has no administration in time range)  lactated ringers infusion (has no administration in time range)  lactated ringers bolus 500 mL (has no administration in time range)  etomidate (AMIDATE) injection 20 mg (has no administration in time range)  succinylcholine (ANECTINE) syringe 120 mg (has no administration in time range)  sodium chloride 0.9 % bolus 1,000 mL (0 mLs Intravenous Stopped 01/04/22 2034)  acetaminophen (TYLENOL) tablet 1,000 mg (1,000 mg Oral Given 01/05/22 0203)  lactated ringers bolus 1,000 mL (1,000 mLs Intravenous New Bag/Given 01/05/22 0158)  methylPREDNISolone sodium succinate (SOLU-MEDROL) 125 mg/2 mL injection 125 mg (125 mg Intravenous Given 01/05/22 0201)  ondansetron (ZOFRAN) injection 4 mg (4 mg Intravenous Given 01/05/22 0159)  fentaNYL (SUBLIMAZE) injection 50 mcg (50 mcg Intravenous Given 01/05/22 0208)  ipratropium-albuterol (DUONEB) 0.5-2.5 (3) MG/3ML nebulizer solution 3 mL (3 mLs Nebulization Given 01/05/22 0208)  iohexol (OMNIPAQUE) 350 MG/ML injection 100 mL (100 mLs Intravenous Contrast Given 01/05/22 0257)     ED COURSE: CT chest abdomen pelvis showing multifocal pneumonia with no acute intra-abdominal pathology.  Abdominal pain most likely from vomiting.  Patient is COVID-positive.  She has a  normal white count, negative lactic acid but does have very high procalcitonin of 129.54.  With elevated procalcitonin and immunosuppression on methotrexate and prednisone will cove for superimposed bacterial pneumonia with Rocephin and azithromycin.  Patient met sepsis criteria and was initiated on sepsis protocol for IV hydration.  Tylenol given for fever.  Repeat troponin borderline elevated 18 most likely demand ischemia in the setting of hypoxia.  Patient received remdesivir, Solu-Medrol, antibiotics, fluids.  I consulted the hospitalist and after discussion patient has been accepted to their service for admission   Consults: Hospitalist   EMR reviewed including patient's last visit to primary care doctor from December 2022 she was seen for hypertension, CHF, and A. fib       FINAL CLINICAL IMPRESSION(S) / ED DIAGNOSES   Final diagnoses:  Acute respiratory failure with hypoxia ([place])  COVID-19  Sepsis with acute hypoxic respiratory failure without septic shock, due to unspecified organism [facility])     Rx / DC Orders   ED Discharge Orders     None        Note:  This document was prepared using Dragon  voice recognition software and may include unintentional dictation errors.    Alfred Levins, [place], MD 01/05/22 [phone]

## 2022-01-05 NOTE — H&P (Signed)
[place]   PATIENT NAME: Cassandra Brown    MR#:  [id number]  DATE OF BIRTH:  01-16-1966  DATE OF ADMISSION:  01/05/2022  PRIMARY CARE PHYSICIAN: Baxter Hire, MD   Patient is coming from: Home  REQUESTING/REFERRING PHYSICIAN: Rudene Re, MD  CHIEF COMPLAINT:   Chief Complaint  Patient presents with   Fever    HISTORY OF PRESENT ILLNESS:  Cassandra Brown is a 56 y.o. female with medical history significant for HFpEF, chronic pain, rheumatoid arthritis on prednisone and methotrexate, Wegener's granulomatosis of the lung and asthma, who presented to the ER with acute onset of feeling sick for the last week with multiple symptoms including sore and scratchy throat, fever and chills, nausea, vomiting and diarrhea as well as dyspnea and cough productive of yellowish sputum.  She stated that she had 2 vaccine injections and 2 booster once for COVID-19.  No chest pain or palpitations.  No dysuria, oliguria or hematuria or flank pain.  She denies any abdominal pain.  No melena or bright red blood per rectum.  No other bleeding diathesis.  ED Course: When she came to the ER temperature was 100.5 and later 102.4 with respiratory to 24 and a pulse oximetry of 98% on 3 L of O2 by nasal cannula and later 96% on 2 L.  Labs revealed mild hypokalemia 3.3 and hypochloremia of 96 with otherwise unremarkable CMP.  CBC was within normal.  High-sensitivity troponin I was 18 and procalcitonin was 129.54.  Influenza antigens came back negative.  COVID-19 PCR came back positive.  Blood cultures were drawn  EKG as reviewed by me : EKG showed normal sinus rhythm with a rate of 90 Imaging: 2 view chest x-ray showed the following: Bilateral lung opacities which are at least partially chronic (likely scarring related to history of granulomatosis with polyangiitis), however increased patchy opacities predominantly in the right mid to lower lung may reflect superimposed acute inflammation or  infection.  Chest CTA and CT of the abdomen showed the following: 1. No CT evidence of pulmonary embolism. 2. Multifocal pneumonia, likely viral or atypical in etiology. 3. No acute intra-abdominal or pelvic pathology. 4. Fatty liver. 5. Aortic Atherosclerosis 6.  Emphysema.  The patient was given IV Rocephin and Zithromax, 50 mcg IV fentanyl, DuoNeb's, 1.5 L of IV lactated Ringer followed by 150 mill per hour, 125 mg of IV Solu-Medrol, 4 mg of IV Zofran as well as IV remdesivir.  She will be admitted to a med monitored bed for further evaluation and management.  PAST MEDICAL HISTORY:   Past Medical History:  Diagnosis Date   Allergic rhinitis due to pollen    Angina pectoris (HCC)    CHF (congestive heart failure) (HCC)    Chronic maxillary sinusitis    Chronic pain syndrome    BACK   Chronic right sacroiliac joint pain    Collagen vascular disease (HCC)    Coronary artery abnormality    Dysrhythmia    AFIB,  FREQ PVC   Gastroparesis    GERD (gastroesophageal reflux disease)    History of kidney stones    Leukocytosis    Obesity    Patent foramen ovale    Rheumatoid arthritis ([place])    OSTEOARTHRITIS   Sleep apnea    Spinal stenosis of lumbar region    Unequal leg length    Vasculitis ([place])    Wegener's disease, pulmonary     PAST SURGICAL HISTORY:   Past Surgical  History:  Procedure Laterality Date   APPENDECTOMY     CHOLECYSTECTOMY     JOINT REPLACEMENT Right    knee   KNEE SURGERY Left    NASAL HEMORRHAGE CONTROL N/A 05/13/2021   Procedure: EPISTAXIS CONTROL;  Surgeon: Carloyn Manner, MD;  Location: [facility] ORS;  Service: ENT;  Laterality: N/A;   PULSE GENERATOR IMPLANT Left 06/23/2020   Procedure: LEFT FLANK PULSE GENERATOR IMPLANT;  Surgeon: Deetta Perla, MD;  Location: [facility];  Service: Neurosurgery;  Laterality: Left;   SHOULDER SURGERY Bilateral    THORACIC LAMINECTOMY FOR SPINAL CORD STIMULATOR Bilateral 06/23/2020   Procedure: THORACIC SPINAL CORD  STIMULATOR;  Surgeon: Deetta Perla, MD;  Location: [facility];  Service: Neurosurgery;  Laterality: Bilateral;   VIDEO BRONCHOSCOPY WITH ENDOBRONCHIAL NAVIGATION N/A 02/27/2021   Procedure: VIDEO BRONCHOSCOPY WITH ENDOBRONCHIAL NAVIGATION;  Surgeon: Ottie Glazier, MD;  Location: [facility];  Service: Thoracic;  Laterality: N/A;   VIDEO BRONCHOSCOPY WITH ENDOBRONCHIAL ULTRASOUND N/A 02/27/2021   Procedure: VIDEO BRONCHOSCOPY WITH ENDOBRONCHIAL ULTRASOUND;  Surgeon: Ottie Glazier, MD;  Location: [facility];  Service: Thoracic;  Laterality: N/A;    SOCIAL HISTORY:   Social History   Tobacco Use   Smoking status: Former    Packs/day: 3.00    Years: 33.00    Pack years: 99.00    Types: Cigarettes    Quit date: 02/06/2011    Years since quitting: 10.9   Smokeless tobacco: Never  Substance Use Topics   Alcohol use: Not Currently    FAMILY HISTORY:  No family history on file.  She does not remember any medical problems with her family.  DRUG ALLERGIES:   Allergies  Allergen Reactions   Cyclobenzaprine Hives   Ibuprofen Hives   Morphine Itching and Swelling    Other reaction(s): itching/swelling    Meperidine Itching and Swelling   Sulfa Antibiotics Swelling   Onion Itching    Can use onion powder    REVIEW OF SYSTEMS:   ROS As per history of present illness. All pertinent systems were reviewed above. Constitutional, HEENT, cardiovascular, respiratory, GI, GU, musculoskeletal, neuro, psychiatric, endocrine, integumentary and hematologic systems were reviewed and are otherwise negative/unremarkable except for positive findings mentioned above in the HPI.   MEDICATIONS AT HOME:   Prior to Admission medications   Medication Sig Start Date End Date Taking? Authorizing Provider  ADVAIR HFA 230-21 MCG/ACT inhaler Inhale 2 puffs into the lungs 2 (two) times daily. 12/10/21   [provider]  aspirin EC 81 MG tablet Take 81 mg by mouth daily. Swallow whole.    [provider]  BAC 50-325-40 MG tablet Take 1 tablet by mouth every 4 (four) hours as needed for headache. 02/10/21   [provider]  carisoprodol (SOMA) 350 MG tablet Take 350 mg by mouth 3 (three) times daily.    [provider]  cholecalciferol (VITAMIN D3) 25 MCG (1000 UNIT) tablet Take 1,000 Units by mouth daily.    [provider]  clobetasol cream (TEMOVATE) 0.92 % Apply 1 application topically 2 (two) times daily as needed (rash).    [provider]  DUPIXENT 300 MG/2ML SOPN Inject 300 mg into the skin every 14 (fourteen) days. 05/04/21   [provider]  EMGALITY 120 MG/ML SOAJ Inject 120 mg into the skin every 28 (twenty-eight) days. 02/05/21   [provider]  EPINEPHrine 0.3 mg/0.3 mL IJ SOAJ injection Inject 0.3 mg into the muscle as needed. 12/10/21   [provider]  Fluticasone-Umeclidin-Vilant (TRELEGY ELLIPTA) 200-62.5-25 MCG/INH AEPB Inhale 1 puff into the lungs daily. 06/17/21   Tyler Pita, MD  folic acid (FOLVITE) 1 MG tablet Take 1 mg by mouth daily.    [provider]  furosemide (LASIX) 20 MG tablet Take 20 mg by mouth daily. 12/30/20   [provider]  gentamicin ointment (GARAMYCIN) 0.1 % Apply 1 application topically daily as needed (rash). 09/06/19   [provider]  Golimumab (SIMPONI) 50 MG/0.5ML SOAJ Inject 50 mg into the skin every 30 (thirty) days. Patient not taking: Reported on 07/30/2021    [provider]  HYDROcodone-acetaminophen (NORCO) 10-325 MG tablet Take 1 tablet by mouth every 6 (six) hours.    [provider]  HYDROcodone-acetaminophen (NORCO) 10-325 MG tablet Take 1 tablet by mouth every 6 (six) hours as needed. 06/01/21   [provider]  methotrexate 2.5 MG tablet Take 10 mg by mouth once a week. Caution:Chemotherapy. Protect from light.    [provider]  montelukast (SINGULAIR) 10 MG tablet Take 10 mg by mouth at bedtime.     [provider]  naloxone [facility]) nasal spray 4 mg/0.1 mL For opioid overdose or excessive sleepiness after taking narcotics 06/23/20   Zdeb, Altha Harm, NP  NURTEC 75 MG TBDP Take 75 mg by mouth daily as needed for migraine. 01/15/21   [provider]  pantoprazole (PROTONIX) 40 MG tablet Take 40 mg by mouth daily.    [provider]  pentoxifylline (TRENTAL) 400 MG CR tablet Take 400 mg by mouth in the morning and at bedtime.    [provider]  pimecrolimus (ELIDEL) 1 % cream Apply 1 application topically 2 (two) times daily as needed (rash).    [provider]  predniSONE (DELTASONE) 5 MG tablet Take 5 mg by mouth daily with breakfast.    [provider]  pregabalin (LYRICA) 75 MG capsule Take 75 mg by mouth 2 (two) times daily.    [provider]  promethazine (PHENERGAN) 25 MG tablet Take 25 mg by mouth every 6 (six) hours as needed for nausea or vomiting.     [provider]  propranolol ER (INDERAL LA) 80 MG 24 hr capsule Take 80 mg by mouth at bedtime. 02/09/21   [provider]  rosuvastatin (CRESTOR) 10 MG tablet Take 10 mg by mouth at bedtime. 02/18/21   [provider]  sodium chloride (OCEAN) 0.65 % SOLN nasal spray Place 1 spray into both nostrils as needed for congestion.    [provider]  sotalol (BETAPACE) 80 MG tablet Take 40 mg by mouth 2 (two) times daily. 05/29/20   [provider]  Specialty Vitamins Products (CENTRUM PERFORMANCE) TABS Take 1 tablet by mouth daily.    [provider]  SPIRIVA RESPIMAT 1.25 MCG/ACT AERS Inhale 2 puffs into the lungs daily. 12/10/21   [provider]  sucralfate (CARAFATE) 1 g tablet Take 1 g by mouth 2 (two) times daily before a meal. 02/09/21   [provider]  TRELEGY ELLIPTA 100-62.5-25 MCG/INH AEPB Inhale 1 puff into the lungs daily. Patient not taking: Reported on 01/05/2022 01/05/21   [provider]   triamcinolone cream (KENALOG) 0.1 % Apply 1 application topically daily as needed (itching).  01/08/20   [provider]      VITAL SIGNS:  Blood pressure 118/79, pulse 88, temperature 98.2 F (36.8 C), temperature source Oral, resp. rate 20, height 5\' 2"  (1.575 m), weight 79.4 kg, SpO2  98 %.  PHYSICAL EXAMINATION:  Physical Exam  GENERAL:  56 y.o.-year-old Caucasian female patient lying in the bed with mild respiratory distress with conversational dyspnea.   EYES: Pupils equal, round, reactive to light and accommodation. No scleral icterus. Extraocular muscles intact.  HEENT: Head atraumatic, normocephalic. Oropharynx and nasopharynx clear.  NECK:  Supple, no jugular venous distention. No thyroid enlargement, no tenderness.  LUNGS: Diminished bibasilar breath sounds with bibasal crackles. CARDIOVASCULAR: Regular rate and rhythm, S1, S2 normal. No murmurs, rubs, or gallops.  ABDOMEN: Soft, nondistended, nontender. Bowel sounds present. No organomegaly or mass.  EXTREMITIES: No pedal edema, cyanosis, or clubbing.  NEUROLOGIC: Cranial nerves II through XII are intact. Muscle strength 5/5 in all extremities. Sensation intact. Gait not checked.  PSYCHIATRIC: The patient is alert and oriented x 3.  Normal affect and good eye contact. SKIN: No obvious rash, lesion, or ulcer.   LABORATORY PANEL:   CBC Recent Labs  Lab 01/04/22 1444  WBC 4.2  HGB 13.4  HCT 40.4  PLT 301   ------------------------------------------------------------------------------------------------------------------  Chemistries  Recent Labs  Lab 01/04/22 1444  NA 136  K 3.3*  CL 96*  CO2 26  GLUCOSE 88  BUN 10  CREATININE 0.80  CALCIUM 8.6*  AST 59*  ALT 35  ALKPHOS 61  BILITOT 0.8   ------------------------------------------------------------------------------------------------------------------  Cardiac Enzymes No results for input(s): TROPONINI in the last 168  hours. ------------------------------------------------------------------------------------------------------------------  RADIOLOGY:  DG Chest 2 View  Result Date: 01/04/2022 CLINICAL DATA:  Suspected sepsis.  Fever, hypoxia, and hypotension. EXAM: CHEST - 2 VIEW COMPARISON:  Chest radiograph 02/27/2021 and CT 12/17/2021 FINDINGS: The cardiomediastinal silhouette is unchanged with normal heart size. Mild patchy and partly nodular opacities in the right mid and lower lung appear new from the prior radiograph although the interval chest CT demonstrated chronic findings which could account for some but likely not all of these densities. Left lung densities likely predominantly reflect scarring although a superimposed acute process is not excluded. No pleural effusion or pneumothorax is identified. A thoracic spinal cord stimulator and right upper quadrant abdominal surgical clips are noted. IMPRESSION: Bilateral lung opacities which are at least partially chronic (likely scarring related to history of granulomatosis with polyangiitis), however increased patchy opacities predominantly in the right mid to lower lung may reflect superimposed acute inflammation or infection. Electronically Signed   By: Logan Bores M.D.   On: 01/04/2022 15:41   CT Angio Chest PE W and/or Wo Contrast  Result Date: 01/05/2022 CLINICAL DATA:  Epigastric pain. Also concern for pulmonary embolism. EXAM: CT ANGIOGRAPHY CHEST CT ABDOMEN AND PELVIS WITH CONTRAST TECHNIQUE: Multidetector CT imaging of the chest was performed using the standard protocol during bolus administration of intravenous contrast. Multiplanar CT image reconstructions and MIPs were obtained to evaluate the vascular anatomy. Multidetector CT imaging of the abdomen and pelvis was performed using the standard protocol during bolus administration of intravenous contrast. CONTRAST:  167mL OMNIPAQUE IOHEXOL 350 MG/ML SOLN COMPARISON:  CT abdomen pelvis dated 04/08/2008  and chest radiograph dated 01/04/2022. FINDINGS: Evaluation of this exam is limited due to respiratory motion artifact. CTA CHEST FINDINGS Cardiovascular: Borderline cardiomegaly. No pericardial effusion. Amplatzer occlusive device noted. Mild atherosclerotic calcification of the thoracic aorta. No aneurysmal dilatation. The origins of the great vessels of the aortic arch appear patent as visualized. Evaluation of the pulmonary arteries is very limited due to severe respiratory motion artifact and suboptimal opacification and timing of the contrast. No definite pulmonary artery embolus identified. Mediastinum/Nodes: No  hilar or mediastinal adenopathy. The esophagus is grossly unremarkable. No mediastinal fluid collection. Lungs/Pleura: Bilateral streaky densities and clusters of ground-glass density throughout the lungs most consistent with multifocal pneumonia, likely viral or atypical in etiology. Clinical correlation is recommended. Probable small pleural effusion along the fissures bilaterally. No pneumothorax. The central airways are patent. There is background of mild paraseptal emphysema. Musculoskeletal: No acute osseous pathology. Lower thoracic spinal stimulator. Review of the MIP images confirms the above findings. CT ABDOMEN and PELVIS FINDINGS No intra-abdominal free air or free fluid. Hepatobiliary: Fatty liver. No intrahepatic biliary dilatation. The gallbladder is surgically absent. Pancreas: Unremarkable. No pancreatic ductal dilatation or surrounding inflammatory changes. Spleen: Normal in size without focal abnormality. Adrenals/Urinary Tract: The adrenal glands unremarkable. Small right renal inferior pole cyst. There is no hydronephrosis on either side. There is symmetric enhancement and excretion of contrast by both kidneys. The visualized ureters and urinary bladder appear unremarkable. Stomach/Bowel: There is no bowel obstruction or active inflammation. Appendectomy. Vascular/Lymphatic:  Moderate aortoiliac atherosclerotic disease. The IVC is unremarkable. No portal venous gas. There is no adenopathy. Reproductive: The uterus is grossly unremarkable. No adnexal masses. Other: None Musculoskeletal: Degenerative changes of the spine and scoliosis. No acute osseous pathology. Review of the MIP images confirms the above findings. IMPRESSION: 1. No CT evidence of pulmonary embolism. 2. Multifocal pneumonia, likely viral or atypical in etiology. 3. No acute intra-abdominal or pelvic pathology. 4. Fatty liver. 5. Aortic Atherosclerosis (ICD10-I70.0) and Emphysema (ICD10-J43.9). Electronically Signed   By: Anner Crete M.D.   On: 01/05/2022 03:23   CT ABDOMEN PELVIS W CONTRAST  Result Date: 01/05/2022 CLINICAL DATA:  Epigastric pain. Also concern for pulmonary embolism. EXAM: CT ANGIOGRAPHY CHEST CT ABDOMEN AND PELVIS WITH CONTRAST TECHNIQUE: Multidetector CT imaging of the chest was performed using the standard protocol during bolus administration of intravenous contrast. Multiplanar CT image reconstructions and MIPs were obtained to evaluate the vascular anatomy. Multidetector CT imaging of the abdomen and pelvis was performed using the standard protocol during bolus administration of intravenous contrast. CONTRAST:  165mL OMNIPAQUE IOHEXOL 350 MG/ML SOLN COMPARISON:  CT abdomen pelvis dated 04/08/2008 and chest radiograph dated 01/04/2022. FINDINGS: Evaluation of this exam is limited due to respiratory motion artifact. CTA CHEST FINDINGS Cardiovascular: Borderline cardiomegaly. No pericardial effusion. Amplatzer occlusive device noted. Mild atherosclerotic calcification of the thoracic aorta. No aneurysmal dilatation. The origins of the great vessels of the aortic arch appear patent as visualized. Evaluation of the pulmonary arteries is very limited due to severe respiratory motion artifact and suboptimal opacification and timing of the contrast. No definite pulmonary artery embolus identified.  Mediastinum/Nodes: No hilar or mediastinal adenopathy. The esophagus is grossly unremarkable. No mediastinal fluid collection. Lungs/Pleura: Bilateral streaky densities and clusters of ground-glass density throughout the lungs most consistent with multifocal pneumonia, likely viral or atypical in etiology. Clinical correlation is recommended. Probable small pleural effusion along the fissures bilaterally. No pneumothorax. The central airways are patent. There is background of mild paraseptal emphysema. Musculoskeletal: No acute osseous pathology. Lower thoracic spinal stimulator. Review of the MIP images confirms the above findings. CT ABDOMEN and PELVIS FINDINGS No intra-abdominal free air or free fluid. Hepatobiliary: Fatty liver. No intrahepatic biliary dilatation. The gallbladder is surgically absent. Pancreas: Unremarkable. No pancreatic ductal dilatation or surrounding inflammatory changes. Spleen: Normal in size without focal abnormality. Adrenals/Urinary Tract: The adrenal glands unremarkable. Small right renal inferior pole cyst. There is no hydronephrosis on either side. There is symmetric enhancement and excretion of contrast by both  kidneys. The visualized ureters and urinary bladder appear unremarkable. Stomach/Bowel: There is no bowel obstruction or active inflammation. Appendectomy. Vascular/Lymphatic: Moderate aortoiliac atherosclerotic disease. The IVC is unremarkable. No portal venous gas. There is no adenopathy. Reproductive: The uterus is grossly unremarkable. No adnexal masses. Other: None Musculoskeletal: Degenerative changes of the spine and scoliosis. No acute osseous pathology. Review of the MIP images confirms the above findings. IMPRESSION: 1. No CT evidence of pulmonary embolism. 2. Multifocal pneumonia, likely viral or atypical in etiology. 3. No acute intra-abdominal or pelvic pathology. 4. Fatty liver. 5. Aortic Atherosclerosis (ICD10-I70.0) and Emphysema (ICD10-J43.9). Electronically  Signed   By: Anner Crete M.D.   On: 01/05/2022 03:23      IMPRESSION AND PLAN:  Principal Problem:   Acute respiratory failure due to COVID-19 ([place])  1.  Acute hypoxemic respiratory failure secondary to COVID-19. -The patient will be admitted to a medically monitored isolation bed. -O2 protocol will be followed to keep O2 saturation above 93.   2.  Multifocal pneumonia secondary to COVID-19. -The patient will be admitted to an isolation monitored bed with droplet and contact precautions. -Given multifocal pneumonia we will empirically place the patient on IV Rocephin and Zithromax for possible bacterial superinfection specially with significantly elevated Procalcitonin. -The patient will be placed on scheduled Mucinex and as needed Tussionex. -We will avoid nebulization as much as we can, give bronchodilator MDI if needed, and with deterioration of oxygenation try to avoid BiPAP/CPAP if possible.    -Will obtain sputum Gram stain culture and sensitivity and follow blood cultures. -O2 protocol will be followed. -We will follow CRP, ferritin, LDH and D-dimer. -Will follow manual differential for ANC/ALC ratio as well as follow troponin I and daily CBC with manual differential and CMP. - Will place the patient on IV Remdesivir and IV steroid therapy with IV Solu-Medrol with elevated inflammatory markers. -The patient will be placed on vitamin D3, vitamin C, zinc sulfate, p.o. Pepcid and aspirin. -I discussed Baricitinib and the patient agreed to proceed with it.  3.  Sepsis due to COVID-19 and pneumonia. - Lactic acid level was 1.6 and later 1.3 but she had significant elevated procalcitonin. - Management as above. - She will be hydrated with IV normal saline.  4.  Rheumatoid arthritis. - We will continue her prednisone and methotrexate.  5.  Peripheral vascular disease. - We will continue to trend out.  6.  Dyslipidemia. - We will continue statin therapy.  7.  GERD. - We  will continue PPI therapy as well as Carafate.  8.  Asthma without exacerbation. - We will continue Singulair and her inhalers.  DVT prophylaxis: Lovenox. Code Status: full code. Family Communication:  The plan of care was discussed in details with the patient (and family). I answered all questions. The patient agreed to proceed with the above mentioned plan. Further management will depend upon hospital course. Disposition Plan: Back to previous home environment Consults called: none. All the records are reviewed and case discussed with ED provider.  Status is: Inpatient  At the time of the admission, it appears that the appropriate admission status for this patient is inpatient.  This is judged to be reasonable and necessary in order to provide the required intensity of service to ensure the patient's safety given the presenting symptoms, physical exam findings and initial radiographic and laboratory data in the context of comorbid conditions.  The patient requires inpatient status due to high intensity of service, high risk of further deterioration and high  frequency of surveillance required.  I certify that at the time of admission, it is my clinical judgment that the patient will require inpatient hospital care extending more than 2 midnights.                            Dispo: The patient is from: Home              Anticipated d/c is to: Home              Patient currently is not medically stable to d/c.              Difficult to place patient: No      Christel Mormon M.D on 01/05/2022 at 8:43 AM  [facility]   From 7 PM-7 AM, contact night-coverage [email or web address]  CC: Primary care physician; Baxter Hire, MD

## 2022-01-05 NOTE — Progress Notes (Signed)
CODE SEPSIS - PHARMACY COMMUNICATION  **Broad Spectrum Antibiotics should be administered within 1 hour of Sepsis diagnosis**  Time Code Sepsis Called/Page Received: 0344  Antibiotics Ordered: Azithromycin & Ceftriaxone  Time of 1st antibiotic administration: Carrizo Hill, PharmD, [place] 01/05/2022 3:44 AM

## 2022-01-05 NOTE — Progress Notes (Signed)
Sepsis tracking by eLINK 

## 2022-01-05 NOTE — Progress Notes (Signed)
One blood culture drawn yesterday @ 1444, one drawn today @ 0704 was drawn after antibiotics given

## 2022-01-05 NOTE — Progress Notes (Signed)
Notified bedside nurse of need to draw lactic acid.  

## 2022-01-06 DIAGNOSIS — J9601 Acute respiratory failure with hypoxia: Secondary | ICD-10-CM | POA: Diagnosis present

## 2022-01-06 DIAGNOSIS — A419 Sepsis, unspecified organism: Secondary | ICD-10-CM | POA: Diagnosis present

## 2022-01-06 DIAGNOSIS — R1013 Epigastric pain: Secondary | ICD-10-CM | POA: Diagnosis present

## 2022-01-06 LAB — COMPREHENSIVE METABOLIC PANEL
ALT: 23 U/L (ref 0–44)
AST: 29 U/L (ref 15–41)
Albumin: 2.8 g/dL — ABNORMAL LOW (ref 3.5–5.0)
Alkaline Phosphatase: 47 U/L (ref 38–126)
Anion gap: 4 — ABNORMAL LOW (ref 5–15)
BUN: 13 mg/dL (ref 6–20)
CO2: 25 mmol/L (ref 22–32)
Calcium: 8.4 mg/dL — ABNORMAL LOW (ref 8.9–10.3)
Chloride: 112 mmol/L — ABNORMAL HIGH (ref 98–111)
Creatinine, Ser: 0.64 mg/dL (ref 0.44–1.00)
GFR, Estimated: >60 mL/min (ref >60)
Glucose, Bld: 125 mg/dL — ABNORMAL HIGH (ref 70–99)
Potassium: 3.6 mmol/L (ref 3.5–5.1)
Sodium: 141 mmol/L (ref 135–145)
Total Bilirubin: 0.3 mg/dL (ref 0.3–1.2)
Total Protein: 5.9 g/dL — ABNORMAL LOW (ref 6.5–8.1)

## 2022-01-06 LAB — CBC WITH DIFFERENTIAL/PLATELET
Abs Immature Granulocytes: 0.02 10*3/uL (ref 0.00–0.07)
Basophils Absolute: 0 10*3/uL (ref 0.0–0.1)
Basophils Relative: 0 %
Eosinophils Absolute: 0 10*3/uL (ref 0.0–0.5)
Eosinophils Relative: 0 %
HCT: 33.9 % — ABNORMAL LOW (ref 36.0–46.0)
Hemoglobin: 11.3 g/dL — ABNORMAL LOW (ref 12.0–15.0)
Immature Granulocytes: 1 %
Lymphocytes Relative: 15 %
Lymphs Abs: 0.6 10*3/uL — ABNORMAL LOW (ref 0.7–4.0)
MCH: 27.6 pg (ref 26.0–34.0)
MCHC: 33.3 g/dL (ref 30.0–36.0)
MCV: 82.7 fL (ref 80.0–100.0)
Monocytes Absolute: 0.5 10*3/uL (ref 0.1–1.0)
Monocytes Relative: 12 %
Neutro Abs: 3 10*3/uL (ref 1.7–7.7)
Neutrophils Relative %: 72 %
Platelets: 287 10*3/uL (ref 150–400)
RBC: 4.1 MIL/uL (ref 3.87–5.11)
RDW: 14.1 % (ref 11.5–15.5)
WBC: 4 10*3/uL (ref 4.0–10.5)
nRBC: 0 % (ref 0.0–0.2)

## 2022-01-06 LAB — C-REACTIVE PROTEIN: CRP: 11.9 mg/dL — ABNORMAL HIGH (ref <1.0)

## 2022-01-06 LAB — FERRITIN: Ferritin: 638 ng/mL — ABNORMAL HIGH (ref 11–307)

## 2022-01-06 LAB — D-DIMER, QUANTITATIVE: D-Dimer, Quant: 1.59 ug/mL-FEU — ABNORMAL HIGH (ref 0.00–0.50)

## 2022-01-06 MED ORDER — PANTOPRAZOLE SODIUM 40 MG IV SOLR
40.0000 mg | Freq: Two times a day (BID) | INTRAVENOUS | Status: DC
  Administered 2022-01-06 – 2022-01-11 (×11): 40 mg via INTRAVENOUS
  Filled 2022-01-06 (×11): qty 40

## 2022-01-06 MED ORDER — PENTOXIFYLLINE ER 400 MG PO TBCR
400.0000 mg | EXTENDED_RELEASE_TABLET | Freq: Two times a day (BID) | ORAL | Status: DC
  Administered 2022-01-06 – 2022-01-12 (×12): 400 mg via ORAL
  Filled 2022-01-06 (×13): qty 1

## 2022-01-06 MED ORDER — ENSURE ENLIVE PO LIQD
237.0000 mL | Freq: Three times a day (TID) | ORAL | Status: DC
  Administered 2022-01-06: 237 mL via ORAL

## 2022-01-06 MED ORDER — BARICITINIB 2 MG PO TABS
4.0000 mg | ORAL_TABLET | Freq: Every day | ORAL | Status: DC
  Administered 2022-01-09 – 2022-01-10 (×2): 4 mg via ORAL
  Filled 2022-01-06 (×5): qty 2

## 2022-01-06 MED ORDER — ENOXAPARIN SODIUM 300 MG/3ML IJ SOLN
0.5000 mg/kg | INTRAMUSCULAR | Status: DC
  Filled 2022-01-06: qty 0.4

## 2022-01-06 MED ORDER — ENOXAPARIN SODIUM 300 MG/3ML IJ SOLN: 0.5000 mg/kg | INTRAMUSCULAR | Status: DC

## 2022-01-06 MED ORDER — METHOTREXATE 2.5 MG PO TABS
25.0000 mg | ORAL_TABLET | ORAL | Status: DC
  Administered 2022-01-06: 25 mg via ORAL
  Filled 2022-01-06: qty 10

## 2022-01-06 MED ORDER — ENOXAPARIN SODIUM 40 MG/0.4ML IJ SOSY: 40.0000 mg | PREFILLED_SYRINGE | INTRAMUSCULAR | Status: DC

## 2022-01-06 MED ORDER — PHENOL 1.4 % MT LIQD
1.0000 | OROMUCOSAL | Status: DC | PRN
  Administered 2022-01-06: 1 via OROMUCOSAL
  Filled 2022-01-06 (×2): qty 177

## 2022-01-06 NOTE — Assessment & Plan Note (Addendum)
Due to Covid PNA and now acute PEs.   No baseline need for oxygen.    1/16: Still on 4 to 5 L/min O2 --Supplement O2 to keep sats >90% --Wean O2 as tolerated --Incentive spirometry

## 2022-01-06 NOTE — Assessment & Plan Note (Addendum)
Stable, no wheezing to suggest exacerbation.  Acute respiratory issues and hypoxia due to Covid. High risk for exacerbation. On steroids given hypoxia with Covid. As needed albuterol.

## 2022-01-06 NOTE — Assessment & Plan Note (Addendum)
Appears overall euvolemic. Monitor volume status. Prior echo in care everywhere from April 2022 at [facility] showed watchman device well-seated, normal LV and RV systolic function.

## 2022-01-06 NOTE — Assessment & Plan Note (Signed)
Due to RA and OA of multiple joints, lumbar radiculopathy. --Continue home pain regimen

## 2022-01-06 NOTE — Assessment & Plan Note (Signed)
Hbg stable.  Monitor.

## 2022-01-06 NOTE — Assessment & Plan Note (Addendum)
HR controlled.  Status post watchman device placement.   No longer on anticoagulation after watchman.  Did not tolerate anticoagulation due to bleeding (severe epistaxis with GPA). --Telemetry --Continue sotalol -- Transitioning heparin to Eliquis (given DVT/PE's)

## 2022-01-06 NOTE — Hospital Course (Signed)
56 y.o. female with medical history significant for HFpEF, chronic pain, rheumatoid arthritis on prednisone and methotrexate, GPA of the lung and asthma and prone to nose bleeds, who presented to the ER on 01/04/2022  with about one week history of multiple symptoms including sore and scratchy throat, fever and chills, nausea, vomiting and diarrhea as well as dyspnea and cough productive of yellowish sputum.  She stated that she had 2 vaccine injections and 2 booster once for COVID-19.    In the ED patient was febrile, tachypneic, requiring supplemental oxygen.  Covid-19 PCR Positive.  CXR with b/l opacities, partially chronic scarrying due to GPA.   CTA chest/abdomen/pelvis showed:   1. No CT evidence of pulmonary embolism. 2. Multifocal pneumonia, likely viral or atypical in etiology. 3. No acute intra-abdominal or pelvic pathology. 4. Fatty liver. 5. Aortic Atherosclerosis 6.  Emphysema.

## 2022-01-06 NOTE — Assessment & Plan Note (Signed)
Stable, no chest pain. Continue ASA, statin

## 2022-01-06 NOTE — TOC Initial Note (Signed)
Transition of Care [facility]) - Initial/Assessment Note    Patient Details  Name: Cassandra Brown MRN: [id number] Date of Birth: 1966-11-26  Transition of Care [facility]) CM/SW Contact:    Beverly Sessions, RN Phone Number: 01/06/2022, 2:32 PM  Clinical Narrative:                  Transition of Care (TOC) Screening Note   Patient Details  Name: Cassandra Brown Date of Birth: January 16, 1966   Transition of Care [facility]) CM/SW Contact:    Beverly Sessions, RN Phone Number: 01/06/2022, 2:32 PM    Transition of Care Department [facility]) has reviewed patient and no TOC needs have been identified at this time. We will continue to monitor patient advancement through interdisciplinary progression rounds. If new patient transition needs arise, please place a TOC consult.          Patient Goals and CMS Choice        Expected Discharge Plan and Services                                                Prior Living Arrangements/Services                       Activities of Daily Living Home Assistive Devices/Equipment: Cane (specify quad or straight), Eyeglasses (straight) ADL Screening (condition at time of admission) Patient's cognitive ability adequate to safely complete daily activities?: Yes Is the patient deaf or have difficulty hearing?: No Does the patient have difficulty seeing, even when wearing glasses/contacts?: No Does the patient have difficulty concentrating, remembering, or making decisions?: Yes Patient able to express need for assistance with ADLs?: Yes Does the patient have difficulty dressing or bathing?: No Independently performs ADLs?: Yes (appropriate for developmental age) Does the patient have difficulty walking or climbing stairs?: No Weakness of Legs: None Weakness of Arms/Hands: None  Permission Sought/Granted                  Emotional Assessment              Admission diagnosis:  Acute respiratory failure with hypoxia ([place])  [J96.01] Sepsis with acute hypoxic respiratory failure without septic shock, due to unspecified organism ([place]) [A41.9, R65.20, J96.01] Acute respiratory failure due to COVID-19 ([place]) [U07.1, J96.00] COVID-19 [U07.1] Patient Active Problem List   Diagnosis Date Noted   Acute respiratory failure due to COVID-19 ([place]) 01/05/2022   SI joint arthritis 08/24/2021   Piriformis muscle pain 08/24/2021   Epistaxis 05/13/2021   Bleeding nose 05/13/2021   Leukocytosis 05/13/2021   Chronic diastolic CHF (congestive heart failure) ([place]) 05/13/2021   Normocytic anemia 05/13/2021   Chronic radicular lumbar pain 01/29/2020   Status post total right knee replacement 01/20/2020   Frequent PVCs 01/20/2020   Chronic sacroiliac joint pain 02/13/2019   Atrial fibrillation, chronic ([place]) 01/01/2019   Asthma 01/01/2019   Coronary artery disease involving native heart with angina pectoris ([place]) 01/01/2019   Heart failure, unspecified ([place]) 01/01/2019   Obstructive sleep apnea 01/01/2019   Rheumatoid arthritis ([place]) 01/01/2019   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 ([place]) 10/10/2018   Osteoarthritis of multiple joints 09/13/2018   Lumbar radiculopathy, chronic 01/16/2018   Spinal stenosis of lumbar region without neurogenic claudication 01/16/2018   Right hip pain 01/16/2018   Patent foramen ovale 08/17/2017  Chronic pain syndrome 01/20/2017   Spondylosis without myelopathy or radiculopathy, lumbar region 01/20/2017   PCP:  Baxter Hire, MD Pharmacy:   [place] [facility], [place] [address] Phone: [phone] Fax: [phone]     Social Determinants of Health (SDOH) Interventions    Readmission Risk Interventions No flowsheet data found.

## 2022-01-06 NOTE — Assessment & Plan Note (Signed)
Home pain regimen continued. Mobilize, OOB as much as tolerated.

## 2022-01-06 NOTE — Progress Notes (Signed)
Initial Nutrition Assessment  DOCUMENTATION CODES:   Obesity unspecified  INTERVENTION:   Ensure Enlive po TID, each supplement provides 350 kcal and 20 grams of protein  MVI po daily   Liberalize diet   Pt at high refeed risk; recommend monitor potassium, magnesium and phosphorus labs daily until stable  NUTRITION DIAGNOSIS:   Inadequate oral intake related to acute illness as evidenced by meal completion < 50%.  GOAL:   Patient will meet greater than or equal to 90% of their needs  MONITOR:   PO intake, Supplement acceptance, Labs, Weight trends, Skin, I & O's  REASON FOR ASSESSMENT:   Malnutrition Screening Tool    ASSESSMENT:   56 y.o. female with medical history significant for HFpEF, GERD, gastroparesis, chronic pain, rheumatoid arthritis on prednisone and methotrexate, Wegener's granulomatosis of the lung and asthma who is admitted with COVID 19.  Met with pt in room today. Pt reports poor appetite and oral intake for the past 10 days r/t nausea, vomiting and diarrhea. Pt reports that she has only been able to eat sips and bites of meals pta. Pt reports that today is the best she has eaten in two weeks; pt reports eating 1/2 of a egg sandwich for lunch today. Pt does report that she continues to have some nausea and diarrhea. RD discussed with pt the importance of adequate nutrition needed to preserve lean muscle. Pt reports that she does not like Boost but reports that she is willing to try strawberry Ensure in hospital. RD will add supplements to help pt meet her estimated needs. RD will also liberalize pt's diet. Pt is likely at refeed risk.   Medications reviewed and include: aspirin, D3, folic acid, lasix, solu-medrol, MVI, protonix, carafate, azithromycin, ceftriaxone   Labs reviewed: K 3.6 wnl  NUTRITION - FOCUSED PHYSICAL EXAM:  Flowsheet Row Most Recent Value  Orbital Region No depletion  Upper Arm Region No depletion  Thoracic and Lumbar Region No  depletion  Buccal Region No depletion  Temple Region No depletion  Clavicle Bone Region No depletion  Clavicle and Acromion Bone Region No depletion  Scapular Bone Region No depletion  Dorsal Hand No depletion  Patellar Region No depletion  Anterior Thigh Region No depletion  Posterior Calf Region No depletion  Edema (RD Assessment) None  Hair Reviewed  Eyes Reviewed  Mouth Reviewed  Skin Reviewed  Nails Reviewed   Diet Order:   Diet Order             Diet regular Room service appropriate? Yes; Fluid consistency: Thin  Diet effective now                  EDUCATION NEEDS:   Education needs have been addressed  Skin:  Skin Assessment: Reviewed RN Assessment  Last BM:  1/10- diarrhea  Height:   Ht Readings from Last 1 Encounters:  01/04/22 5' 2" (1.575 m)    Weight:   Wt Readings from Last 1 Encounters:  01/06/22 89.7 kg    Ideal Body Weight:  50 kg  BMI:  Body mass index is 36.16 kg/m.  Estimated Nutritional Needs:   Kcal:  2000-2300kcal/day  Protein:  100-115g/day  Fluid:  1.6-1.8L/day  Koleen Distance MS, RD, LDN Please refer to [facility] for RD and/or RD on-call/weekend/after hours pager

## 2022-01-06 NOTE — Assessment & Plan Note (Addendum)
POA.  +Covid-19 PCR and multifocal opacities on imaging, hypoxia and met sepsis on admission with fever, tachypnea.  Elevated procal, suspect concurrent / secondary bacterial infection. Now with acute VTE as complication.   1/13: D-dimer increased significantly up to 6.75 today.  Patient had declined Lovenox and it appears as not been wearing SCDs as ordered 1/14: D-dimer > 20. CTA ordered but pt initially refused, later agreed once high risk of the situation explained in great detail.  Heparin drip ordered first thing this AM, pt also refused this until later this afternoon. Doppler US with LLE DVT.  CTA chest positive for multiple bilateral nonocclusive segmental Pes 1/15: Patient tolerating heparin drip, on 4 to 5 L of oxygen -- Completed course of Rocephin/Zithromax -- Completed remdesivir -- Continue baricitinib -- Initially on IV steroids>> prednisone 50 mg -- On prednisone to 40 mg, wean --Oxygen supplementation per protocol --Monitor inflammatory markers, CBC, CMP --Continue vitamins, ASA, as needed bronchodilator --Airborne and contact precautions

## 2022-01-06 NOTE — Assessment & Plan Note (Addendum)
Patient reports approximately 2-week history of epigastric abdominal pain that is constant, associated nausea with occasional vomiting, altered taste.,  Very poor p.o. intake tolerance. Scheduled for EGD and colonoscopy as outpatient on 02/04/2022. She reports taking oral PPI twice daily at home. CT abdomen pelvis without any acute findings. Suspect due to gastritis and/or peptic ulcer disease. -- Continue Carafate -- Treated with IV PPI BID for a few days -- Resume oral Protonix BID -- Antiemetics as needed -- IV fluids stopped on 1/11 -- Diet as tolerated Given current respiratory infection and oxygen requirement, unable to do procedures at this time.

## 2022-01-06 NOTE — Progress Notes (Signed)
Progress Note   Patient: Cassandra Brown [id number]:[id number] DOB: September 04, 1966 DOA: 01/05/2022     1 DOS: the patient was seen and examined on 01/06/2022   Brief hospital course: 56 y.o. female with medical history significant for HFpEF, chronic pain, rheumatoid arthritis on prednisone and methotrexate, GPA of the lung and asthma and prone to nose bleeds, who presented to the ER on 01/04/2022  with about one week history of multiple symptoms including sore and scratchy throat, fever and chills, nausea, vomiting and diarrhea as well as dyspnea and cough productive of yellowish sputum.  She stated that she had 2 vaccine injections and 2 booster once for COVID-19.    In the ED patient was febrile, tachypneic, requiring supplemental oxygen.  Covid-19 PCR Positive.  CXR with b/l opacities, partially chronic scarrying due to GPA.   CTA chest/abdomen/pelvis showed:   1. No CT evidence of pulmonary embolism. 2. Multifocal pneumonia, likely viral or atypical in etiology. 3. No acute intra-abdominal or pelvic pathology. 4. Fatty liver. 5. Aortic Atherosclerosis 6.  Emphysema.   Assessment and Plan * Pneumonia due to COVID-19 virus- (present on admission) POA.  +Covid-19 PCR and multifocal opacities on imaging, hypoxia and met sepsis on admission with fever, tachypnea.  Elevated procal, suspect concurrent / secondary bacterial infection --Continue Rocephin/Zithromax --Continue Remdesivir, baricitinib --Continue IV steroids --Oxygen supplementation per protocol Monitor inflammatory markers, CBC, CMP --Continue vitamins, ASA, Pepcid for gastric protection --Airborne and contact precautions  Sepsis ([place])- (present on admission) POA with fevers, tachycardia, tachypnea, due to Covid PNA. Sepsis physiology has improved. Treated per protocol and abx and IV fluids on admission. Monitor hemodynamics. Treat infection as outlined.   Acute respiratory failure with hypoxia ([place])- (present on admission) Due to  Covid PNA.  No baseline need for oxygen.   Currently on 4 L/min. --Supplement O2 to keep sats >90% --Wean O2 as tolerated --Incentive spirometry  Asthma- (present on admission) Stable, no wheezing to suggest exacerbation.  Acute respiratory issues and hypoxia due to Covid. High risk for exacerbation. On steroids given hypoxia with Covid.   Atrial fibrillation, chronic ([place])- (present on admission) HR controlled.  Status post watchman device placement.   No longer on anticoagulation after watchman.  Did not tolerate anticoagulation due to bleeding (severe epistaxis with GPA). --Telemetry --Continue sotalol  Chronic diastolic CHF (congestive heart failure) ([place])- (present on admission) Appears overall euvolemic.  Coronary artery disease involving native heart with angina pectoris ([place])- (present on admission) Stable, no chest pain. 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  Chronic pain syndrome- (present on admission) Due to RA and OA of multiple joints, lumbar radiculopathy. --Continue home pain regimen  Normocytic anemia- (present on admission) Hbg stable.  Monitor.  Rheumatoid arthritis ([place])- (present on admission) Stable.  Continue steroid and methotrexate.  (takes prednisone 5 mg daily, currently on IV steroids)  Osteoarthritis of multiple joints- (present on admission) Home pain regimen continued. Mobilize, OOB as much as tolerated.  Epigastric abdominal pain- (present on admission) Patient reports approximately 2-week history of epigastric abdominal pain that is constant, associated nausea with occasional vomiting, altered taste.,  Very poor p.o. intake tolerance. Scheduled for EGD and colonoscopy as outpatient on 02/04/2022. She reports taking oral PPI twice daily at home. CT abdomen pelvis without any acute findings. Suspect due to  gastritis and/or peptic ulcer disease. -- Continue Carafate -- IV PPI twice daily for now -- We will touch  base with GI to see if scopes can be done sooner -- Antiemetics as needed -- DC IV fluids, tolerating water for maintenance hydration -- Diet as tolerated     Subjective: Patient seen awake sitting up in bed today.  She reports being sick since December 27.  She has had ongoing abdominal pain with nausea, diarrhea she reports is bloody at times.  She has some typical red hemorrhoidal bleeding but states her diarrhea has become dark.  She is scheduled for scopes with GI on February 9.  Is able to sip on water and keep it down fine.  Confirms she does not use oxygen at baseline.  Objective Vitals reviewed and notable for oxygen requirement 4 L/min nasal cannula, normal heart rate, no fever since 1:30 AM yesterday 1/10.  General exam: awake, alert, no acute distress HEENT: atraumatic, clear conjunctiva, anicteric sclera, moist mucus membranes, hearing grossly normal  Respiratory system: Bilateral fine crackles, no expiratory wheezes, nnormal respiratory effort at rest, on 4 L/min [place] O2. Cardiovascular system: normal S1/S2, RRR, no pedal edema.   Gastrointestinal system: soft, nondistended Central nervous system: A&O x4. no gross focal neurologic deficits, normal speech Extremities: moves all, no cyanosis, normal tone Skin: dry, intact, normal temperature Psychiatry: normal mood, congruent affect, judgement and insight appear normal   Data Reviewed:  Labs reviewed and notable for chloride 112, glucose 125, albumin 2.8, CRP 11.9 (down from 19.9), ferritin 638, hemoglobin 11.3, lymphocyte count 0.6, D-dimer 1.59 (down from 1.98),  Family Communication: None at bedside, will attempt to call  Disposition: Status is: Inpatient  Remains inpatient appropriate because: Severity of illness with ongoing oxygen requirement in setting of COVID-19 multifocal pneumonia         Time  spent: 35 minutes  Author: Ezekiel Slocumb, DO 01/06/2022 7:19 PM  For on call review [email or web address].

## 2022-01-06 NOTE — Assessment & Plan Note (Addendum)
Stable.  Continue steroid and methotrexate.  (takes prednisone 5 mg daily, currently on 40 mg)

## 2022-01-06 NOTE — Assessment & Plan Note (Signed)
With lung involvement and upper airway, frequent epistaxis.

## 2022-01-06 NOTE — Assessment & Plan Note (Signed)
POA with fevers, tachycardia, tachypnea, due to Covid PNA. Sepsis physiology has improved. Treated per protocol and abx and IV fluids on admission. Monitor hemodynamics. Treat infection as outlined.

## 2022-01-07 DIAGNOSIS — E876 Hypokalemia: Secondary | ICD-10-CM | POA: Clinically undetermined

## 2022-01-07 LAB — PHOSPHORUS: Phosphorus: 2.2 mg/dL — ABNORMAL LOW (ref 2.5–4.6)

## 2022-01-07 LAB — COMPREHENSIVE METABOLIC PANEL
ALT: 20 U/L (ref 0–44)
AST: 27 U/L (ref 15–41)
Albumin: 2.6 g/dL — ABNORMAL LOW (ref 3.5–5.0)
Alkaline Phosphatase: 48 U/L (ref 38–126)
Anion gap: 3 — ABNORMAL LOW (ref 5–15)
BUN: 19 mg/dL (ref 6–20)
CO2: 29 mmol/L (ref 22–32)
Calcium: 8.8 mg/dL — ABNORMAL LOW (ref 8.9–10.3)
Chloride: 112 mmol/L — ABNORMAL HIGH (ref 98–111)
Creatinine, Ser: 0.63 mg/dL (ref 0.44–1.00)
GFR, Estimated: >60 mL/min (ref >60)
Glucose, Bld: 158 mg/dL — ABNORMAL HIGH (ref 70–99)
Potassium: 3.4 mmol/L — ABNORMAL LOW (ref 3.5–5.1)
Sodium: 144 mmol/L (ref 135–145)
Total Bilirubin: 0.4 mg/dL (ref 0.3–1.2)
Total Protein: 5.5 g/dL — ABNORMAL LOW (ref 6.5–8.1)

## 2022-01-07 LAB — CBC WITH DIFFERENTIAL/PLATELET
Abs Immature Granulocytes: 0.07 10*3/uL (ref 0.00–0.07)
Basophils Absolute: 0 10*3/uL (ref 0.0–0.1)
Basophils Relative: 0 %
Eosinophils Absolute: 0 10*3/uL (ref 0.0–0.5)
Eosinophils Relative: 0 %
HCT: 32.8 % — ABNORMAL LOW (ref 36.0–46.0)
Hemoglobin: 11 g/dL — ABNORMAL LOW (ref 12.0–15.0)
Immature Granulocytes: 1 %
Lymphocytes Relative: 6 %
Lymphs Abs: 0.5 10*3/uL — ABNORMAL LOW (ref 0.7–4.0)
MCH: 28.2 pg (ref 26.0–34.0)
MCHC: 33.5 g/dL (ref 30.0–36.0)
MCV: 84.1 fL (ref 80.0–100.0)
Monocytes Absolute: 0.6 10*3/uL (ref 0.1–1.0)
Monocytes Relative: 7 %
Neutro Abs: 7.7 10*3/uL (ref 1.7–7.7)
Neutrophils Relative %: 86 %
Platelets: 317 10*3/uL (ref 150–400)
RBC: 3.9 MIL/uL (ref 3.87–5.11)
RDW: 14.4 % (ref 11.5–15.5)
WBC: 8.9 10*3/uL (ref 4.0–10.5)
nRBC: 0 % (ref 0.0–0.2)

## 2022-01-07 LAB — FERRITIN: Ferritin: 415 ng/mL — ABNORMAL HIGH (ref 11–307)

## 2022-01-07 LAB — MAGNESIUM: Magnesium: 2 mg/dL (ref 1.7–2.4)

## 2022-01-07 LAB — C-REACTIVE PROTEIN: CRP: 4.8 mg/dL — ABNORMAL HIGH (ref <1.0)

## 2022-01-07 LAB — D-DIMER, QUANTITATIVE: D-Dimer, Quant: 1.75 ug/mL-FEU — ABNORMAL HIGH (ref 0.00–0.50)

## 2022-01-07 MED ORDER — ALBUTEROL SULFATE HFA 108 (90 BASE) MCG/ACT IN AERS
1.0000 | INHALATION_SPRAY | RESPIRATORY_TRACT | Status: DC | PRN
  Filled 2022-01-07: qty 6.7

## 2022-01-07 MED ORDER — POTASSIUM PHOSPHATES 15 MMOLE/5ML IV SOLN
15.0000 mmol | Freq: Once | INTRAVENOUS | Status: AC
  Administered 2022-01-07: 15 mmol via INTRAVENOUS
  Filled 2022-01-07: qty 5

## 2022-01-07 NOTE — Assessment & Plan Note (Addendum)
Resolved.  Phos 2.4 on 1/14 was replaced, previously replaced on 1/12. --Monitor level and replace further as needed.

## 2022-01-07 NOTE — Assessment & Plan Note (Addendum)
Persistent the past several days despite replacement.  K 3.4>> 3.0>> 2.8>> 2.6>> 3.5 today. Have been replacing daily --Hold replacement today --Monitor BMP and replace further as needed.

## 2022-01-07 NOTE — Progress Notes (Signed)
Progress Note   Patient: KAILEN NAME HAL:[id number] DOB: 04-Aug-1966 DOA: 01/05/2022     2 DOS: the patient was seen and examined on 01/07/2022   Brief hospital course: 56 y.o. female with medical history significant for HFpEF, chronic pain, rheumatoid arthritis on prednisone and methotrexate, GPA of the lung and asthma and prone to nose bleeds, who presented to the ER on 01/04/2022  with about one week history of multiple symptoms including sore and scratchy throat, fever and chills, nausea, vomiting and diarrhea as well as dyspnea and cough productive of yellowish sputum.  She stated that she had 2 vaccine injections and 2 booster once for COVID-19.    In the ED patient was febrile, tachypneic, requiring supplemental oxygen.  Covid-19 PCR Positive.  CXR with b/l opacities, partially chronic scarrying due to GPA.   CTA chest/abdomen/pelvis showed:   1. No CT evidence of pulmonary embolism. 2. Multifocal pneumonia, likely viral or atypical in etiology. 3. No acute intra-abdominal or pelvic pathology. 4. Fatty liver. 5. Aortic Atherosclerosis 6.  Emphysema.   Assessment and Plan * Pneumonia due to COVID-19 virus- (present on admission) POA.  +Covid-19 PCR and multifocal opacities on imaging, hypoxia and met sepsis on admission with fever, tachypnea.  Elevated procal, suspect concurrent / secondary bacterial infection --Continue Rocephin/Zithromax --Continue Remdesivir, baricitinib --Continue IV steroids --Oxygen supplementation per protocol Monitor inflammatory markers, CBC, CMP --Continue vitamins, ASA, as needed bronchodilator --Airborne and contact precautions  Sepsis ([place])- (present on admission) POA with fevers, tachycardia, tachypnea, due to Covid PNA. Sepsis physiology has improved. Treated per protocol and abx and IV fluids on admission. Monitor hemodynamics. Treat infection as outlined.   Acute respiratory failure with hypoxia ([place])- (present on admission) Due to Covid  PNA.  No baseline need for oxygen.   1/12: Still requiring 4 L/min supplemental O2. --Supplement O2 to keep sats >90% --Wean O2 as tolerated --Incentive spirometry  Asthma- (present on admission) Stable, no wheezing to suggest exacerbation.  Acute respiratory issues and hypoxia due to Covid. High risk for exacerbation. On steroids given hypoxia with Covid. As needed albuterol.   Atrial fibrillation, chronic ([place])- (present on admission) HR controlled.  Status post watchman device placement.   No longer on anticoagulation after watchman.  Did not tolerate anticoagulation due to bleeding (severe epistaxis with GPA). --Telemetry --Continue sotalol  Chronic diastolic CHF (congestive heart failure) ([place])- (present on admission) Appears overall euvolemic. Monitor volume status. Prior echo in care everywhere from April 2022 at [facility] showed watchman device well-seated, normal LV and RV systolic function.  Coronary artery disease involving native heart with angina pectoris ([place])- (present on admission) Stable, no chest pain. 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  Chronic pain syndrome- (present on admission) Due to RA and OA of multiple joints, lumbar radiculopathy. --Continue home pain regimen  Normocytic anemia- (present on admission) Hbg stable.  Monitor.  Rheumatoid arthritis ([place])- (present on admission) Stable.  Continue steroid and methotrexate.  (takes prednisone 5 mg daily, currently on IV steroids)  Osteoarthritis of multiple joints- (present on admission) Home pain regimen continued. Mobilize, OOB as much as tolerated.  Hypokalemia K3.4 today.  Replacing with potassium phosphate given concurrent hypophosphatemia.   Monitor BMP and replace further as needed.  Hypophosphatemia Phos 2.2 today.  Replacing with potassium phosphate IV.   Monitor level and replace further as needed.  Epigastric abdominal pain- (present on admission) Patient reports approximately 2-week  history of epigastric abdominal pain that is constant, associated nausea with occasional vomiting, altered taste.,  Very poor p.o. intake tolerance. Scheduled for EGD and colonoscopy as outpatient on 02/04/2022. She reports taking oral PPI twice daily at home. CT abdomen pelvis without any acute findings. Suspect due to gastritis and/or peptic ulcer disease. -- Continue Carafate -- IV PPI twice daily for now -- Antiemetics as needed -- IV fluids stopped on 1/11 -- Diet as tolerated Given current respiratory infection and oxygen requirement, unable to do procedures at this time.     Subjective: Patient awake sitting up in bed when seen today.  She reports burning chest discomfort secondary to coughing.  She has been up ambulating to the bathroom and reports some dyspnea on exertion with this.  Has not had a BM.  Stomach pain got bad overnight but is somewhat better this morning.  No other acute complaints at this time.  Objective Vitals reviewed and notable for persistent requirement for 4 L/min supplemental oxygen, BPs soft but stable.  General exam: awake, alert, no acute distress, mildly ill-appearing HEENT: atraumatic, clear conjunctiva, anicteric sclera, moist mucus membranes, hearing grossly normal  Respiratory system: Lungs clear bilaterally with poor aeration and diminished bases, no expiratory wheezes, normal respiratory effort at rest, on 4 L/min [place] O2. Cardiovascular system: normal S1/S2, RRR, no pedal edema.   Central nervous system: A&O x3. no gross focal neurologic deficits, normal speech Extremities: moves all, no cyanosis, normal tone Skin: dry, intact, normal temperature Psychiatry: normal mood, congruent affect, judgement and insight appear normal   Data Reviewed:  Labs reviewed and notable for K3.4, glucose Kos 158, chloride 112, Phos  low at 2.2, albumin 2.6, CRP improved to 4.8 from 11.9, hemoglobin 11.0 stable, D-dimer 1.75 up slightly from 1.59.  Family Communication: None at bedside.  Disposition: Status is: Inpatient  Remains inpatient appropriate because: Severity of illness with persistent 4 L/min oxygen requirement and on IV therapies as outlined above         Time spent: 35 minutes  Author: Ezekiel Slocumb, DO 01/07/2022 2:23 PM  For on call review [email or web address].

## 2022-01-08 DIAGNOSIS — F1190 Opioid use, unspecified, uncomplicated: Secondary | ICD-10-CM

## 2022-01-08 LAB — COMPREHENSIVE METABOLIC PANEL
ALT: 21 U/L (ref 0–44)
AST: 28 U/L (ref 15–41)
Albumin: 2.7 g/dL — ABNORMAL LOW (ref 3.5–5.0)
Alkaline Phosphatase: 44 U/L (ref 38–126)
Anion gap: 6 (ref 5–15)
BUN: 18 mg/dL (ref 6–20)
CO2: 27 mmol/L (ref 22–32)
Calcium: 8.3 mg/dL — ABNORMAL LOW (ref 8.9–10.3)
Chloride: 107 mmol/L (ref 98–111)
Creatinine, Ser: 0.68 mg/dL (ref 0.44–1.00)
GFR, Estimated: >60 mL/min (ref >60)
Glucose, Bld: 118 mg/dL — ABNORMAL HIGH (ref 70–99)
Potassium: 3 mmol/L — ABNORMAL LOW (ref 3.5–5.1)
Sodium: 140 mmol/L (ref 135–145)
Total Bilirubin: 0.5 mg/dL (ref 0.3–1.2)
Total Protein: 5.7 g/dL — ABNORMAL LOW (ref 6.5–8.1)

## 2022-01-08 LAB — FERRITIN: Ferritin: 364 ng/mL — ABNORMAL HIGH (ref 11–307)

## 2022-01-08 LAB — CBC WITH DIFFERENTIAL/PLATELET
Abs Immature Granulocytes: 0.04 10*3/uL (ref 0.00–0.07)
Basophils Absolute: 0 10*3/uL (ref 0.0–0.1)
Basophils Relative: 0 %
Eosinophils Absolute: 0 10*3/uL (ref 0.0–0.5)
Eosinophils Relative: 0 %
HCT: 31.2 % — ABNORMAL LOW (ref 36.0–46.0)
Hemoglobin: 10.2 g/dL — ABNORMAL LOW (ref 12.0–15.0)
Immature Granulocytes: 1 %
Lymphocytes Relative: 7 %
Lymphs Abs: 0.4 10*3/uL — ABNORMAL LOW (ref 0.7–4.0)
MCH: 27.5 pg (ref 26.0–34.0)
MCHC: 32.7 g/dL (ref 30.0–36.0)
MCV: 84.1 fL (ref 80.0–100.0)
Monocytes Absolute: 0.2 10*3/uL (ref 0.1–1.0)
Monocytes Relative: 3 %
Neutro Abs: 5.6 10*3/uL (ref 1.7–7.7)
Neutrophils Relative %: 89 %
Platelets: 320 10*3/uL (ref 150–400)
RBC: 3.71 MIL/uL — ABNORMAL LOW (ref 3.87–5.11)
RDW: 14.5 % (ref 11.5–15.5)
WBC: 6.3 10*3/uL (ref 4.0–10.5)
nRBC: 0 % (ref 0.0–0.2)

## 2022-01-08 LAB — C-REACTIVE PROTEIN: CRP: 4.9 mg/dL — ABNORMAL HIGH (ref <1.0)

## 2022-01-08 LAB — PHOSPHORUS: Phosphorus: 2.8 mg/dL (ref 2.5–4.6)

## 2022-01-08 LAB — D-DIMER, QUANTITATIVE: D-Dimer, Quant: 6.75 ug/mL-FEU — ABNORMAL HIGH (ref 0.00–0.50)

## 2022-01-08 MED ORDER — POTASSIUM CHLORIDE CRYS ER 20 MEQ PO TBCR
40.0000 meq | EXTENDED_RELEASE_TABLET | ORAL | Status: AC
  Administered 2022-01-08 (×2): 40 meq via ORAL
  Filled 2022-01-08 (×2): qty 2

## 2022-01-08 MED ORDER — PREDNISONE 20 MG PO TABS
40.0000 mg | ORAL_TABLET | Freq: Every day | ORAL | Status: DC
  Administered 2022-01-09 – 2022-01-12 (×4): 40 mg via ORAL
  Filled 2022-01-08 (×4): qty 2

## 2022-01-08 NOTE — Plan of Care (Signed)
°  Problem: Clinical Measurements: Goal: Ability to maintain clinical measurements within normal limits will improve Outcome: Progressing Goal: Will remain free from infection Outcome: Progressing Goal: Diagnostic test results will improve Outcome: Progressing Goal: Respiratory complications will improve Outcome: Progressing Goal: Cardiovascular complication will be avoided Outcome: Progressing   Problem: Pain Managment: Goal: General experience of comfort will improve Outcome: Progressing   Pt is involved in and agrees with the plan of care. V/S stable. Dyspnea on exertion noted; oxygen sat dropped to 82% with ambulation while on oxygen at 3.5L. Oxygen sat increased to high 90%s once settled in bed.

## 2022-01-08 NOTE — Progress Notes (Signed)
Progress Note   Patient: Cassandra Brown WUJ:[id number] DOB: 04/15/1966 DOA: 01/05/2022     3 DOS: the patient was seen and examined on 01/08/2022   Brief hospital course: 56 y.o. female with medical history significant for HFpEF, chronic pain, rheumatoid arthritis on prednisone and methotrexate, GPA of the lung and asthma and prone to nose bleeds, who presented to the ER on 01/04/2022  with about one week history of multiple symptoms including sore and scratchy throat, fever and chills, nausea, vomiting and diarrhea as well as dyspnea and cough productive of yellowish sputum.  She stated that she had 2 vaccine injections and 2 booster once for COVID-19.    In the ED patient was febrile, tachypneic, requiring supplemental oxygen.  Covid-19 PCR Positive.  CXR with b/l opacities, partially chronic scarrying due to GPA.   CTA chest/abdomen/pelvis showed:   1. No CT evidence of pulmonary embolism. 2. Multifocal pneumonia, likely viral or atypical in etiology. 3. No acute intra-abdominal or pelvic pathology. 4. Fatty liver. 5. Aortic Atherosclerosis 6.  Emphysema.   Assessment and Plan * Pneumonia due to COVID-19 virus- (present on admission) POA.  +Covid-19 PCR and multifocal opacities on imaging, hypoxia and met sepsis on admission with fever, tachypnea.  Elevated procal, suspect concurrent / secondary bacterial infection 1/13: D-dimer increased significantly up to 6.75 today.  Patient had declined Lovenox and it appears as not been wearing SCDs as ordered --Lower extremity Doppler ultrasounds to evaluate for DVT --Continue Rocephin/Zithromax --Continue Remdesivir, baricitinib --Continue IV steroids, transition to Prednisone tomorrow AM --Oxygen supplementation per protocol Monitor inflammatory markers, CBC, CMP --Continue vitamins, ASA, as needed bronchodilator --Airborne and contact precautions  Sepsis ([place])- (present on admission) POA with fevers, tachycardia, tachypnea, due to Covid  PNA. Sepsis physiology has improved. Treated per protocol and abx and IV fluids on admission. Monitor hemodynamics. Treat infection as outlined.   Acute respiratory failure with hypoxia ([place])- (present on admission) Due to Covid PNA.  No baseline need for oxygen.   1/1: Weaning down O2, on 3 L/min this AM. --Supplement O2 to keep sats >90% --Wean O2 as tolerated --Incentive spirometry  Asthma- (present on admission) Stable, no wheezing to suggest exacerbation.  Acute respiratory issues and hypoxia due to Covid. High risk for exacerbation. On steroids given hypoxia with Covid. As needed albuterol.   Atrial fibrillation, chronic ([place])- (present on admission) HR controlled.  Status post watchman device placement.   No longer on anticoagulation after watchman.  Did not tolerate anticoagulation due to bleeding (severe epistaxis with GPA). --Telemetry --Continue sotalol  Chronic diastolic CHF (congestive heart failure) ([place])- (present on admission) Appears overall euvolemic. Monitor volume status. Prior echo in care everywhere from April 2022 at [facility] showed watchman device well-seated, normal LV and RV systolic function.  Coronary artery disease involving native heart with angina pectoris ([place])- (present on admission) Stable, no chest pain. 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  Chronic pain syndrome- (present on admission) Due to RA and OA of multiple joints, lumbar radiculopathy. --Continue home pain regimen  Normocytic anemia- (present on admission) Hbg stable.  Monitor.  Rheumatoid arthritis ([place])- (present on admission) Stable.  Continue steroid and methotrexate.  (takes prednisone 5 mg daily, currently on IV steroids)  Osteoarthritis of multiple joints- (present on admission) Home pain regimen continued. Mobilize, OOB as much as  tolerated.  Hypokalemia K3.4>>3.0 today.  Replacing with PO K-Cl today.   Monitor  BMP and replace further as needed.  Hypophosphatemia Phos 2.2 on 1/12, was replaced with potassium phosphate IV.  Monitor level and replace further as needed.  Epigastric abdominal pain- (present on admission) Patient reports approximately 2-week history of epigastric abdominal pain that is constant, associated nausea with occasional vomiting, altered taste.,  Very poor p.o. intake tolerance. Scheduled for EGD and colonoscopy as outpatient on 02/04/2022. She reports taking oral PPI twice daily at home. CT abdomen pelvis without any acute findings. Suspect due to gastritis and/or peptic ulcer disease. -- Continue Carafate -- IV PPI twice daily for now -- Antiemetics as needed -- IV fluids stopped on 1/11 -- Diet as tolerated Given current respiratory infection and oxygen requirement, unable to do procedures at this time.     Subjective: Pt awake sitting up in bed when seen today.  She reports feeling okay, slowly feels like getting better.  Does have a little dyspnea on exertion when ambulating to the bathroom but says is not too bad.  Hoping she can go home tomorrow after her last dose of remdesivir.  She is still on 3 L of oxygen but weaning down.  She offers no other acute complaints at this time.  We discussed rise in D-dimer today and her high risk of blood clots given avoiding use of Lovenox with her history of severe epistaxis and fear of bleeding and being sedentary in the hospital bed.  She reassures me that she has been ambulating around the room quite well and has little concern that she has any blood clot right now.   Objective Vitals reviewed and notable for weaned from 5 down to 3 L/min nasal cannula O2 this morning.  No fevers.  BP stable but with soft diastolics in the 01K at times.  General exam: awake, appears fatigued, no acute distress HEENT: atraumatic, clear conjunctiva, anicteric  sclera, moist mucus membranes, hearing grossly normal  Respiratory system: normal respiratory effort, on 3 L/min nasal cannula O2, no conversational dyspnea or accessory muscle use seen. Cardiovascular system: normal S1/S2, RRR Central nervous system: A&O x4. no gross focal neurologic deficits, normal speech Extremities: No calf tenderness bilaterally, no edema, normal tone Skin: dry, intact, normal temperature, normal color Psychiatry: normal mood, congruent affect, judgement and insight appear normal   Data Reviewed:  Labs reviewed and notable for potassium 3.0, glucose 118, calcium 8.3, albumin 2.7, ferritin 364 improving, CRP 4.9 stable, hemoglobin 10.2 down slightly from 11.0, lymphocytopenia, D-dimer increased from 1.75 up to 6.75  Family Communication: None at bedside.  Patient able to update.  Disposition: Status is: Inpatient  Remains inpatient appropriate because: Severity of illness on IV therapies as outlined above, persistent need for supplemental oxygen, rising D-dimer with evaluation underway for VTE         Time spent: 35 minutes  Author: Ezekiel Slocumb, DO 01/08/2022 4:27 PM  For on call review [email or web address].

## 2022-01-09 ENCOUNTER — Inpatient Hospital Stay: Payer: Medicare Other

## 2022-01-09 DIAGNOSIS — R7989 Other specified abnormal findings of blood chemistry: Secondary | ICD-10-CM | POA: Diagnosis present

## 2022-01-09 DIAGNOSIS — I82402 Acute embolism and thrombosis of unspecified deep veins of left lower extremity: Secondary | ICD-10-CM | POA: Diagnosis not present

## 2022-01-09 LAB — D-DIMER, QUANTITATIVE: D-Dimer, Quant: >20 ug/mL-FEU — ABNORMAL HIGH (ref 0.00–0.50)

## 2022-01-09 LAB — CBC WITH DIFFERENTIAL/PLATELET
Abs Immature Granulocytes: 0.04 10*3/uL (ref 0.00–0.07)
Basophils Absolute: 0 10*3/uL (ref 0.0–0.1)
Basophils Relative: 0 %
Eosinophils Absolute: 0 10*3/uL (ref 0.0–0.5)
Eosinophils Relative: 0 %
HCT: 30.7 % — ABNORMAL LOW (ref 36.0–46.0)
Hemoglobin: 10.3 g/dL — ABNORMAL LOW (ref 12.0–15.0)
Immature Granulocytes: 1 %
Lymphocytes Relative: 11 %
Lymphs Abs: 0.7 10*3/uL (ref 0.7–4.0)
MCH: 28.2 pg (ref 26.0–34.0)
MCHC: 33.6 g/dL (ref 30.0–36.0)
MCV: 84.1 fL (ref 80.0–100.0)
Monocytes Absolute: 0.3 10*3/uL (ref 0.1–1.0)
Monocytes Relative: 4 %
Neutro Abs: 5.6 10*3/uL (ref 1.7–7.7)
Neutrophils Relative %: 84 %
Platelets: 302 10*3/uL (ref 150–400)
RBC: 3.65 MIL/uL — ABNORMAL LOW (ref 3.87–5.11)
RDW: 14.2 % (ref 11.5–15.5)
WBC: 6.7 10*3/uL (ref 4.0–10.5)
nRBC: 0 % (ref 0.0–0.2)

## 2022-01-09 LAB — COMPREHENSIVE METABOLIC PANEL
ALT: 22 U/L (ref 0–44)
AST: 24 U/L (ref 15–41)
Albumin: 2.6 g/dL — ABNORMAL LOW (ref 3.5–5.0)
Alkaline Phosphatase: 48 U/L (ref 38–126)
Anion gap: 11 (ref 5–15)
BUN: 16 mg/dL (ref 6–20)
CO2: 29 mmol/L (ref 22–32)
Calcium: 8.3 mg/dL — ABNORMAL LOW (ref 8.9–10.3)
Chloride: 103 mmol/L (ref 98–111)
Creatinine, Ser: 0.61 mg/dL (ref 0.44–1.00)
GFR, Estimated: >60 mL/min (ref >60)
Glucose, Bld: 90 mg/dL (ref 70–99)
Potassium: 2.8 mmol/L — ABNORMAL LOW (ref 3.5–5.1)
Sodium: 143 mmol/L (ref 135–145)
Total Bilirubin: 0.4 mg/dL (ref 0.3–1.2)
Total Protein: 5.5 g/dL — ABNORMAL LOW (ref 6.5–8.1)

## 2022-01-09 LAB — MAGNESIUM: Magnesium: 1.9 mg/dL (ref 1.7–2.4)

## 2022-01-09 LAB — CULTURE, BLOOD (ROUTINE X 2)
Culture: NO GROWTH
Special Requests: ADEQUATE

## 2022-01-09 LAB — HEPARIN LEVEL (UNFRACTIONATED)
Heparin Unfractionated: 0.62 IU/mL (ref 0.30–0.70)
Heparin Unfractionated: 0.67 IU/mL (ref 0.30–0.70)

## 2022-01-09 LAB — FERRITIN: Ferritin: 365 ng/mL — ABNORMAL HIGH (ref 11–307)

## 2022-01-09 LAB — PHOSPHORUS: Phosphorus: 2.4 mg/dL — ABNORMAL LOW (ref 2.5–4.6)

## 2022-01-09 LAB — C-REACTIVE PROTEIN: CRP: 8 mg/dL — ABNORMAL HIGH (ref <1.0)

## 2022-01-09 MED ORDER — HEPARIN (PORCINE) 25000 UT/250ML-% IV SOLN
1300.0000 [IU]/h | INTRAVENOUS | Status: DC
  Administered 2022-01-09: 13:00:00 1500 [IU]/h via INTRAVENOUS
  Administered 2022-01-10: 1400 [IU]/h via INTRAVENOUS
  Administered 2022-01-10: 04:00:00 1500 [IU]/h via INTRAVENOUS
  Filled 2022-01-09 (×3): qty 250

## 2022-01-09 MED ORDER — HEPARIN BOLUS VIA INFUSION
4000.0000 [IU] | Freq: Once | INTRAVENOUS | Status: AC
  Administered 2022-01-09: 4000 [IU] via INTRAVENOUS
  Filled 2022-01-09: qty 4000

## 2022-01-09 MED ORDER — K PHOS MONO-SOD PHOS DI & MONO 155-852-130 MG PO TABS
500.0000 mg | ORAL_TABLET | Freq: Three times a day (TID) | ORAL | Status: AC
  Administered 2022-01-09 – 2022-01-10 (×6): 500 mg via ORAL
  Filled 2022-01-09 (×6): qty 2

## 2022-01-09 MED ORDER — IOHEXOL 350 MG/ML SOLN
75.0000 mL | Freq: Once | INTRAVENOUS | Status: AC | PRN
  Administered 2022-01-09: 75 mL via INTRAVENOUS

## 2022-01-09 NOTE — Assessment & Plan Note (Addendum)
D-dimer significantly increased to > 20. Pt had declined Lovenox for VTE prophylaxis due to prior severe epistaxis, prone to bleedings.  SCD's ordered but unclear if being used.  Pt is ambulatory around the room.  Pt counseled regarding high risk of VTE as a common complication of PVGKK-15. Found to have left lower extremity DVT and multiple bilateral nonocclusive segmental PEs. -- Heparin drip >> Eliquis today

## 2022-01-09 NOTE — Progress Notes (Signed)
Patient now agreeable to heparin infusion. Infusion started and new 20G IV catheter obtained for CT scan.

## 2022-01-09 NOTE — Assessment & Plan Note (Addendum)
Doppler U/S of the lower extremities on 1/14 showed an isolated left peroneal (calf) DVT. 2. Negative for right lower extremity DVT. -- Transition heparin >> Eliquis -- Monitor for bleeding given hx

## 2022-01-09 NOTE — Progress Notes (Signed)
Progress Note   Patient: Cassandra Brown [id number] DOB: 27-Mar-1966 DOA: 01/05/2022     4 DOS: the patient was seen and examined on 01/09/2022   Brief hospital course: 56 y.o. female with medical history significant for HFpEF, chronic pain, rheumatoid arthritis on prednisone and methotrexate, GPA of the lung and asthma and prone to nose bleeds, who presented to the ER on 01/04/2022  with about one week history of multiple symptoms including sore and scratchy throat, fever and chills, nausea, vomiting and diarrhea as well as dyspnea and cough productive of yellowish sputum.  She stated that she had 2 vaccine injections and 2 booster once for COVID-19.    In the ED patient was febrile, tachypneic, requiring supplemental oxygen.  Covid-19 PCR Positive.  CXR with b/l opacities, partially chronic scarrying due to GPA.   CTA chest/abdomen/pelvis showed:   1. No CT evidence of pulmonary embolism. 2. Multifocal pneumonia, likely viral or atypical in etiology. 3. No acute intra-abdominal or pelvic pathology. 4. Fatty liver. 5. Aortic Atherosclerosis 6.  Emphysema.   Assessment and Plan * Pneumonia due to COVID-19 virus- (present on admission) POA.  +Covid-19 PCR and multifocal opacities on imaging, hypoxia and met sepsis on admission with fever, tachypnea.  Elevated procal, suspect concurrent / secondary bacterial infection. Now with acute VTE as complication.   1/13: D-dimer increased significantly up to 6.75 today.  Patient had declined Lovenox and it appears as not been wearing SCDs as ordered 1/14: D-dimer > 20. CTA ordered but pt initially refused, later agreed once high risk of the situation explained in great detail.  Heparin drip ordered first thing this AM, pt also refused this until later this afternoon. Doppler US with LLE DVT. High suspicion for PE given her rising oxygen needs in past 24 hours. --Follow CTA chest --Continue Rocephin/Zithromax --Continue Remdesivir,  baricitinib --Initially on IV steroids --Continue Prednisone 50 mg, wean --Oxygen supplementation per protocol --Monitor inflammatory markers, CBC, CMP --Continue vitamins, ASA, as needed bronchodilator --Airborne and contact precautions  Sepsis ([place])- (present on admission) POA with fevers, tachycardia, tachypnea, due to Covid PNA. Sepsis physiology has improved. Treated per protocol and abx and IV fluids on admission. Monitor hemodynamics. Treat infection as outlined.   Acute respiratory failure with hypoxia ([place])- (present on admission) Due to Covid PNA.  No baseline need for oxygen.   1/1: Weaning down O2, on 3 L/min this AM. --Supplement O2 to keep sats >90% --Wean O2 as tolerated --Incentive spirometry  Asthma- (present on admission) Stable, no wheezing to suggest exacerbation.  Acute respiratory issues and hypoxia due to Covid. High risk for exacerbation. On steroids given hypoxia with Covid. As needed albuterol.   Atrial fibrillation, chronic ([place])- (present on admission) HR controlled.  Status post watchman device placement.   No longer on anticoagulation after watchman.  Did not tolerate anticoagulation due to bleeding (severe epistaxis with GPA). --Telemetry --Continue sotalol  Chronic diastolic CHF (congestive heart failure) ([place])- (present on admission) Appears overall euvolemic. Monitor volume status. Prior echo in care everywhere from April 2022 at [facility] showed watchman device well-seated, normal LV and RV systolic function.  Coronary artery disease involving native heart with angina pectoris ([place])- (present on admission) Stable, no chest pain. 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  Chronic pain syndrome- (present on admission) Due to RA and OA of multiple joints, lumbar radiculopathy. --Continue home pain  regimen  Normocytic anemia- (present  on admission) Hbg stable.  Monitor.  Rheumatoid arthritis ([place])- (present on admission) Stable.  Continue steroid and methotrexate.  (takes prednisone 5 mg daily, currently on 50 mg)  Osteoarthritis of multiple joints- (present on admission) Home pain regimen continued. Mobilize, OOB as much as tolerated.  Deep vein thrombosis (DVT) of left lower extremity (HCC) Doppler U/S of the lower extremities on 1/14 showed an isolated left peroneal (calf) DVT. 2. Negative for right lower extremity DVT. --started on heparin drip this AM  Positive D dimer- (present on admission) D-dimer has significantly increased, today > 20. Pt had declined Lovenox for VTE prophylaxis due to prior severe epistaxis, prone to bleedings.  SCD's ordered but unclear if being used.  Pt is ambulatory around the room.  Pt counseled regarding high risk of VTE as a common complication of ZYYQM-25. --LE doppler U/S showed an acute LLE DVT --Heparin drip started --CTA chest pending  Hypokalemia K 2.8 today despite PO replacement yesterday.   Replacing with K-Phos. Monitor BMP and replace further as needed.  Hypophosphatemia Phos 2.4 this AM, last replaced 1/12. K also low, oral K-Phos ordered. Monitor level and replace further as needed.  Epigastric abdominal pain- (present on admission) Patient reports approximately 2-week history of epigastric abdominal pain that is constant, associated nausea with occasional vomiting, altered taste.,  Very poor p.o. intake tolerance. Scheduled for EGD and colonoscopy as outpatient on 02/04/2022. She reports taking oral PPI twice daily at home. CT abdomen pelvis without any acute findings. Suspect due to gastritis and/or peptic ulcer disease. -- Continue Carafate -- IV PPI twice daily for now -- Antiemetics as needed -- IV fluids stopped on 1/11 -- Diet as tolerated Given current respiratory infection and oxygen requirement, unable to do  procedures at this time.     Subjective: Patient awake sitting up in bed when seen this morning.  She had oxygen saturation to 55% on room air when she got up to the bathroom without wearing oxygen.  Her oxygen needs have also increased from 1 to 3 L yesterday to 5 to 6 L this morning with sats in the upper 80s to low 90s.  Patient declining CTA chest ordered earlier this morning.  Had a long conversation with the patient regarding her high risk of blood clots with COVID and her underlying rheumatoid arthritis and GPA.  Patient ultimately agreed to have the scan and the ultrasound of her legs.  We discussed blood thinners and heparin being very short acting with a reversal agent available if she has bleeding.  She initially was declining heparin drip but eventually she did reluctantly agree.   Objective Vitals reviewed and notable for no fevers, intermittent tachypnea, O2 sats 87% on room air around 830 this morning, tried on 1 L/min O2 with desaturation to the 80s.  Blood pressure stable.  General exam: awake, alert, no acute distress HEENT: atraumatic, clear conjunctiva, anicteric sclera, moist mucus membranes, hearing grossly normal  Respiratory system: CTAB, no wheezes, rales or rhonchi, mildly increased respiratory effort.  O2 sats on monitor 85 to 91% on 5 L/min nasal cannula oxygen Cardiovascular system: normal S1/S2, RRR  Central nervous system: A&O x3. no gross focal neurologic deficits, normal speech Extremities: No cyanosis, no edema, normal tone Skin: dry, intact, normal temperature Psychiatry: normal mood, congruent affect, judgement and insight appear questionable   Data Reviewed:  Labs reviewed and notable for potassium dropped to 2.8 from 3.0 yesterday despite replacement, phosphorus low at 2.4, albumin 2.6, ferritin 365, CRP increased  from 4.9-8.0, hemoglobin stable 10.3, D-dimer greater than 20.  Lower extremity venous Doppler ultrasound revealed a left lower extremity DVT  in the peroneal vein (calf).    Family Communication: None at bedside.  Patient wanted to updated mother her on her own.  Disposition: Status is: Inpatient  Remains inpatient appropriate because: Severity of illness with rising oxygen requirements new DVT and high suspicion for PE with CTA pending.  On heparin drip.        Time spent: 50 minutes  Author: Ezekiel Slocumb, DO 01/09/2022 4:05 PM  For on call review [email or web address].

## 2022-01-09 NOTE — Progress Notes (Signed)
ANTICOAGULATION CONSULT NOTE  Pharmacy Consult for heparin infusion Indication: DVT  Patient Measurements: Height: 5\' 2"  (157.5 cm) Weight: 89.7 kg (197 lb 11.2 oz) IBW/kg (Calculated) : 50.1  Vital Signs: Temp: 98.6 F (37 C) (01/14 0827) Temp Source: Oral (01/14 0454) BP: 140/64 (01/14 0827) Pulse Rate: 77 (01/14 0827)  Labs: Recent Labs    01/07/22 0444 01/08/22 0503 01/09/22 0607  HGB 11.0* 10.2* 10.3*  HCT 32.8* 31.2* 30.7*  PLT 317 320 302  CREATININE 0.63 0.68 0.61    Estimated Creatinine Clearance: 81.7 mL/min (by C-G formula based on SCr of 0.61 mg/dL).   Medical History: Past Medical History:  Diagnosis Date   Allergic rhinitis due to pollen    Angina pectoris (HCC)    CHF (congestive heart failure) (HCC)    Chronic maxillary sinusitis    Chronic pain syndrome    BACK   Chronic right sacroiliac joint pain    Collagen vascular disease (HCC)    Coronary artery abnormality    Dysrhythmia    AFIB,  FREQ PVC   Gastroparesis    GERD (gastroesophageal reflux disease)    History of kidney stones    Leukocytosis    Obesity    Patent foramen ovale    Rheumatoid arthritis (HCC)    OSTEOARTHRITIS   Sleep apnea    Spinal stenosis of lumbar region    Unequal leg length    Vasculitis (HCC)    Wegener's disease, pulmonary     Medications:  Scheduled:   aspirin EC  81 mg Oral Daily   baricitinib  4 mg Oral Daily   carisoprodol  350 mg Oral TID   cholecalciferol  1,000 Units Oral Daily   feeding supplement  237 mL Oral TID BM   folic acid  1 mg Oral Daily   furosemide  20 mg Oral Daily   guaiFENesin  600 mg Oral BID   HYDROcodone-acetaminophen  1 tablet Oral Q6H   methotrexate  25 mg Oral Weekly   montelukast  10 mg Oral QHS   multivitamin with minerals  1 tablet Oral Daily   pantoprazole (PROTONIX) IV  40 mg Intravenous Q12H   pentoxifylline  400 mg Oral BID   phosphorus  500 mg Oral TID   predniSONE  40 mg Oral Q breakfast   pregabalin  75 mg  Oral BID   propranolol ER  80 mg Oral QHS   rosuvastatin  10 mg Oral QHS   sotalol  40 mg Oral BID   sucralfate  1 g Oral BID AC   tiotropium  1 capsule Inhalation Daily    Assessment: 56 y.o. female with medical history significant for HFpEF, GERD, gastroparesis, chronic pain, rheumatoid arthritis on prednisone and methotrexate, Wegener's granulomatosis of the lung and asthma who is admitted with COVID 19. Pt refusing CTA, but D-dimer spiked to >20.  She refused Lovenox shots and not wearning SCD's. Treating empirically for VTE since she refuses evaluation.  Goal of Therapy:  Heparin level 0.3-0.7 units/ml Monitor platelets by anticoagulation protocol: Yes   Plan:  Give 4000 units bolus x 1 Start heparin infusion at 1500 units/hr Check anti-Xa level in 6 hours and daily while on heparin Continue to monitor H&H and platelets  Dallie Piles 01/09/2022,9:07 AM

## 2022-01-09 NOTE — Plan of Care (Signed)
°  Problem: Clinical Measurements: Goal: Ability to maintain clinical measurements within normal limits will improve Outcome: Progressing Goal: Will remain free from infection Outcome: Progressing Goal: Diagnostic test results will improve Outcome: Progressing Goal: Respiratory complications will improve Outcome: Progressing Goal: Cardiovascular complication will be avoided Outcome: Progressing   Problem: Pain Managment: Goal: General experience of comfort will improve Outcome: Progressing   Pt desat while sleeping, increased oxygen at 3lpm/[place] from 1lpm. V/S stable. No BM for 3 days, offered to take milk of mag per order but pt refused.

## 2022-01-09 NOTE — Progress Notes (Signed)
Patient is refusing heparin infusion at this time. MD notified and aware.

## 2022-01-09 NOTE — Progress Notes (Signed)
ANTICOAGULATION CONSULT NOTE  Pharmacy Consult for heparin infusion Indication: DVT  Patient Measurements: Height: 5\' 2"  (157.5 cm) Weight: 89.7 kg (197 lb 11.2 oz) IBW/kg (Calculated) : 50.1  Vital Signs: Temp: 98.6 F (37 C) (01/14 0827) BP: 140/64 (01/14 0827) Pulse Rate: 77 (01/14 0827)  Labs: Recent Labs    01/07/22 0444 01/08/22 0503 01/09/22 0607 01/09/22 1655  HGB 11.0* 10.2* 10.3*  --   HCT 32.8* 31.2* 30.7*  --   PLT 317 320 302  --   HEPARINUNFRC  --   --   --  0.62  CREATININE 0.63 0.68 0.61  --      Estimated Creatinine Clearance: 81.7 mL/min (by C-G formula based on SCr of 0.61 mg/dL).   Medical History: Past Medical History:  Diagnosis Date   Allergic rhinitis due to pollen    Angina pectoris (HCC)    CHF (congestive heart failure) (HCC)    Chronic maxillary sinusitis    Chronic pain syndrome    BACK   Chronic right sacroiliac joint pain    Collagen vascular disease (HCC)    Coronary artery abnormality    Dysrhythmia    AFIB,  FREQ PVC   Gastroparesis    GERD (gastroesophageal reflux disease)    History of kidney stones    Leukocytosis    Obesity    Patent foramen ovale    Rheumatoid arthritis (HCC)    OSTEOARTHRITIS   Sleep apnea    Spinal stenosis of lumbar region    Unequal leg length    Vasculitis (HCC)    Wegener's disease, pulmonary     Medications:  Scheduled:   aspirin EC  81 mg Oral Daily   baricitinib  4 mg Oral Daily   carisoprodol  350 mg Oral TID   cholecalciferol  1,000 Units Oral Daily   feeding supplement  237 mL Oral TID BM   folic acid  1 mg Oral Daily   furosemide  20 mg Oral Daily   guaiFENesin  600 mg Oral BID   HYDROcodone-acetaminophen  1 tablet Oral Q6H   methotrexate  25 mg Oral Weekly   montelukast  10 mg Oral QHS   multivitamin with minerals  1 tablet Oral Daily   pantoprazole (PROTONIX) IV  40 mg Intravenous Q12H   pentoxifylline  400 mg Oral BID   phosphorus  500 mg Oral TID   predniSONE  40 mg  Oral Q breakfast   pregabalin  75 mg Oral BID   propranolol ER  80 mg Oral QHS   rosuvastatin  10 mg Oral QHS   sotalol  40 mg Oral BID   sucralfate  1 g Oral BID AC   tiotropium  1 capsule Inhalation Daily    Assessment: 56 y.o. female with medical history significant for HFpEF, GERD, gastroparesis, chronic pain, rheumatoid arthritis on prednisone and methotrexate, Wegener's granulomatosis of the lung and asthma who is admitted with COVID 19. Pt refusing CTA, but D-dimer spiked to >20.  She refused Lovenox shots and not wearing SCD's. Treating empirically for VTE since she refuses evaluation.  Date Time HL Rate/Comment 1/14 1655 0.62 1500 un/hr     Baseline Labs: aPTT - 42s INR -  1.1 Hgb - 13.4>11.3> (slow down-trend over 6d)>10.3 Plts - 302 (stable/WNL) D-dimer: 6.75 > 20+    Goal of Therapy:  Heparin level 0.3-0.7 units/ml Monitor platelets by anticoagulation protocol: Yes   Plan:  HL therapeutic x1. Continue heparin infusion at 1500 units/hr Check anti-Xa  level in 6 hours to confirm and daily once consecutively therapeutic Continue to monitor H&H and platelets daily while on heparin  Lorna Dibble, PharmD, [place] Pharmacist 01/09/2022 5:36 PM

## 2022-01-09 NOTE — Progress Notes (Signed)
ANTICOAGULATION CONSULT NOTE  Pharmacy Consult for heparin infusion Indication: DVT  Patient Measurements: Height: 5\' 2"  (157.5 cm) Weight: 89.7 kg (197 lb 11.2 oz) IBW/kg (Calculated) : 50.1  Vital Signs: Temp: 98.5 F (36.9 C) (01/14 2139) BP: 127/73 (01/14 2139) Pulse Rate: 72 (01/14 2139)  Labs: Recent Labs    01/07/22 0444 01/08/22 0503 01/09/22 0607 01/09/22 1655 01/09/22 2310  HGB 11.0* 10.2* 10.3*  --   --   HCT 32.8* 31.2* 30.7*  --   --   PLT 317 320 302  --   --   HEPARINUNFRC  --   --   --  0.62 0.67  CREATININE 0.63 0.68 0.61  --   --      Estimated Creatinine Clearance: 81.7 mL/min (by C-G formula based on SCr of 0.61 mg/dL).   Medical History: Past Medical History:  Diagnosis Date   Allergic rhinitis due to pollen    Angina pectoris (HCC)    CHF (congestive heart failure) (HCC)    Chronic maxillary sinusitis    Chronic pain syndrome    BACK   Chronic right sacroiliac joint pain    Collagen vascular disease (HCC)    Coronary artery abnormality    Dysrhythmia    AFIB,  FREQ PVC   Gastroparesis    GERD (gastroesophageal reflux disease)    History of kidney stones    Leukocytosis    Obesity    Patent foramen ovale    Rheumatoid arthritis (HCC)    OSTEOARTHRITIS   Sleep apnea    Spinal stenosis of lumbar region    Unequal leg length    Vasculitis (HCC)    Wegener's disease, pulmonary     Medications:  Scheduled:   aspirin EC  81 mg Oral Daily   baricitinib  4 mg Oral Daily   carisoprodol  350 mg Oral TID   cholecalciferol  1,000 Units Oral Daily   feeding supplement  237 mL Oral TID BM   folic acid  1 mg Oral Daily   furosemide  20 mg Oral Daily   guaiFENesin  600 mg Oral BID   HYDROcodone-acetaminophen  1 tablet Oral Q6H   methotrexate  25 mg Oral Weekly   montelukast  10 mg Oral QHS   multivitamin with minerals  1 tablet Oral Daily   pantoprazole (PROTONIX) IV  40 mg Intravenous Q12H   pentoxifylline  400 mg Oral BID    phosphorus  500 mg Oral TID   predniSONE  40 mg Oral Q breakfast   pregabalin  75 mg Oral BID   propranolol ER  80 mg Oral QHS   rosuvastatin  10 mg Oral QHS   sotalol  40 mg Oral BID   sucralfate  1 g Oral BID AC   tiotropium  1 capsule Inhalation Daily    Assessment: 56 y.o. female with medical history significant for HFpEF, GERD, gastroparesis, chronic pain, rheumatoid arthritis on prednisone and methotrexate, Wegener's granulomatosis of the lung and asthma who is admitted with COVID 19. Pt refusing CTA, but D-dimer spiked to >20.  She refused Lovenox shots and not wearing SCD's. Treating empirically for VTE since she refuses evaluation.  Date Time HL Rate/Comment 1/14 1655 0.62 1500 un/hr  1/14 2310 0.67    Baseline Labs: aPTT - 42s INR -  1.1 Hgb - 13.4>11.3> (slow down-trend over 6d)>10.3 Plts - 302 (stable/WNL) D-dimer: 6.75 > 20+    Goal of Therapy:  Heparin level 0.3-0.7 units/ml Monitor platelets by  anticoagulation protocol: Yes   Plan:  HL therapeutic x2. Continue heparin infusion at 1500 units/hr Check HL daily w/ AM labs while therapeutic on heparin Continue to monitor H&H and platelets daily while on heparin  Renda Rolls, PharmD, [facility] 01/09/2022 11:38 PM

## 2022-01-10 LAB — CBC WITH DIFFERENTIAL/PLATELET
Abs Immature Granulocytes: 0.05 10*3/uL (ref 0.00–0.07)
Basophils Absolute: 0 10*3/uL (ref 0.0–0.1)
Basophils Relative: 0 %
Eosinophils Absolute: 0.1 10*3/uL (ref 0.0–0.5)
Eosinophils Relative: 1 %
HCT: 30.1 % — ABNORMAL LOW (ref 36.0–46.0)
Hemoglobin: 10.1 g/dL — ABNORMAL LOW (ref 12.0–15.0)
Immature Granulocytes: 1 %
Lymphocytes Relative: 18 %
Lymphs Abs: 1.1 10*3/uL (ref 0.7–4.0)
MCH: 28 pg (ref 26.0–34.0)
MCHC: 33.6 g/dL (ref 30.0–36.0)
MCV: 83.4 fL (ref 80.0–100.0)
Monocytes Absolute: 0.4 10*3/uL (ref 0.1–1.0)
Monocytes Relative: 7 %
Neutro Abs: 4.6 10*3/uL (ref 1.7–7.7)
Neutrophils Relative %: 73 %
Platelets: 269 10*3/uL (ref 150–400)
RBC: 3.61 MIL/uL — ABNORMAL LOW (ref 3.87–5.11)
RDW: 14.1 % (ref 11.5–15.5)
WBC: 6.3 10*3/uL (ref 4.0–10.5)
nRBC: 0 % (ref 0.0–0.2)

## 2022-01-10 LAB — COMPREHENSIVE METABOLIC PANEL
ALT: 25 U/L (ref 0–44)
AST: 25 U/L (ref 15–41)
Albumin: 2.3 g/dL — ABNORMAL LOW (ref 3.5–5.0)
Alkaline Phosphatase: 47 U/L (ref 38–126)
Anion gap: 10 (ref 5–15)
BUN: 11 mg/dL (ref 6–20)
CO2: 33 mmol/L — ABNORMAL HIGH (ref 22–32)
Calcium: 8 mg/dL — ABNORMAL LOW (ref 8.9–10.3)
Chloride: 101 mmol/L (ref 98–111)
Creatinine, Ser: 0.65 mg/dL (ref 0.44–1.00)
GFR, Estimated: >60 mL/min (ref >60)
Glucose, Bld: 94 mg/dL (ref 70–99)
Potassium: 2.6 mmol/L — CL (ref 3.5–5.1)
Sodium: 144 mmol/L (ref 135–145)
Total Bilirubin: 0.5 mg/dL (ref 0.3–1.2)
Total Protein: 5.3 g/dL — ABNORMAL LOW (ref 6.5–8.1)

## 2022-01-10 LAB — CULTURE, BLOOD (ROUTINE X 2)
Culture: NO GROWTH
Special Requests: ADEQUATE

## 2022-01-10 LAB — HEPARIN LEVEL (UNFRACTIONATED)
Heparin Unfractionated: 0.75 IU/mL — ABNORMAL HIGH (ref 0.30–0.70)
Heparin Unfractionated: 0.58 IU/mL (ref 0.30–0.70)
Heparin Unfractionated: 0.62 IU/mL (ref 0.30–0.70)

## 2022-01-10 LAB — C-REACTIVE PROTEIN: CRP: 17.3 mg/dL — ABNORMAL HIGH (ref <1.0)

## 2022-01-10 LAB — FERRITIN: Ferritin: 406 ng/mL — ABNORMAL HIGH (ref 11–307)

## 2022-01-10 LAB — D-DIMER, QUANTITATIVE: D-Dimer, Quant: >20 ug/mL-FEU — ABNORMAL HIGH (ref 0.00–0.50)

## 2022-01-10 LAB — PHOSPHORUS: Phosphorus: 3.6 mg/dL (ref 2.5–4.6)

## 2022-01-10 LAB — MAGNESIUM: Magnesium: 2 mg/dL (ref 1.7–2.4)

## 2022-01-10 MED ORDER — POTASSIUM CHLORIDE CRYS ER 20 MEQ PO TBCR
40.0000 meq | EXTENDED_RELEASE_TABLET | ORAL | Status: AC
  Administered 2022-01-10 (×3): 40 meq via ORAL
  Filled 2022-01-10 (×3): qty 2

## 2022-01-10 MED ORDER — BARICITINIB 2 MG PO TABS
4.0000 mg | ORAL_TABLET | Freq: Every day | ORAL | Status: DC
  Administered 2022-01-11 – 2022-01-12 (×2): 4 mg via ORAL
  Filled 2022-01-10 (×2): qty 2

## 2022-01-10 NOTE — Progress Notes (Signed)
ANTICOAGULATION CONSULT NOTE  Pharmacy Consult for heparin infusion Indication: DVT  Patient Measurements: Height: 5\' 2"  (157.5 cm) Weight: 89.7 kg (197 lb 11.2 oz) IBW/kg (Calculated) : 50.1  Vital Signs: Temp: 98.2 F (36.8 C) (01/15 0540) BP: 129/75 (01/15 0540) Pulse Rate: 61 (01/15 0540)  Labs: Recent Labs    01/08/22 0503 01/09/22 0607 01/09/22 1655 01/09/22 2310 01/10/22 0554  HGB 10.2* 10.3*  --   --  10.1*  HCT 31.2* 30.7*  --   --  30.1*  PLT 320 302  --   --  269  HEPARINUNFRC  --   --  0.62 0.67 0.75*  CREATININE 0.68 0.61  --   --  0.65     Estimated Creatinine Clearance: 81.7 mL/min (by C-G formula based on SCr of 0.65 mg/dL).   Medical History: Past Medical History:  Diagnosis Date   Allergic rhinitis due to pollen    Angina pectoris (HCC)    CHF (congestive heart failure) (HCC)    Chronic maxillary sinusitis    Chronic pain syndrome    BACK   Chronic right sacroiliac joint pain    Collagen vascular disease (HCC)    Coronary artery abnormality    Dysrhythmia    AFIB,  FREQ PVC   Gastroparesis    GERD (gastroesophageal reflux disease)    History of kidney stones    Leukocytosis    Obesity    Patent foramen ovale    Rheumatoid arthritis (HCC)    OSTEOARTHRITIS   Sleep apnea    Spinal stenosis of lumbar region    Unequal leg length    Vasculitis (HCC)    Wegener's disease, pulmonary     Medications:  Scheduled:   aspirin EC  81 mg Oral Daily   baricitinib  4 mg Oral Daily   carisoprodol  350 mg Oral TID   cholecalciferol  1,000 Units Oral Daily   feeding supplement  237 mL Oral TID BM   folic acid  1 mg Oral Daily   furosemide  20 mg Oral Daily   guaiFENesin  600 mg Oral BID   HYDROcodone-acetaminophen  1 tablet Oral Q6H   methotrexate  25 mg Oral Weekly   montelukast  10 mg Oral QHS   multivitamin with minerals  1 tablet Oral Daily   pantoprazole (PROTONIX) IV  40 mg Intravenous Q12H   pentoxifylline  400 mg Oral BID    phosphorus  500 mg Oral TID   potassium chloride  40 mEq Oral Q4H   predniSONE  40 mg Oral Q breakfast   pregabalin  75 mg Oral BID   propranolol ER  80 mg Oral QHS   rosuvastatin  10 mg Oral QHS   sotalol  40 mg Oral BID   sucralfate  1 g Oral BID AC   tiotropium  1 capsule Inhalation Daily    Assessment: 56 y.o. female with medical history significant for HFpEF, GERD, gastroparesis, chronic pain, rheumatoid arthritis on prednisone and methotrexate, Wegener's granulomatosis of the lung and asthma who is admitted with COVID 19. Pt refusing CTA, but D-dimer spiked to >20.  She refused Lovenox shots and not wearing SCD's. Treating empirically for VTE since she refuses evaluation.  Date Time HL Rate/Comment 1/14 1655 0.62 1500 un/hr  1/15 0554 0.75 Decrease to 1400 u/hr      Baseline Labs: aPTT - 42s INR -  1.1 Hgb - 13.4>11.3> (slow down-trend over 6d)>10.3 Plts - 302 (stable/WNL) D-dimer: 6.75 > 20+  Goal of Therapy:  Heparin level 0.3-0.7 units/ml Monitor platelets by anticoagulation protocol: Yes   Plan:  1/15 0554 HL=0.75 HL supratherapeutic. Decrease heparin infusion to 1400 units/hr Check anti-Xa level in 6 hours to confirm and daily once consecutively therapeutic Continue to monitor H&H and platelets daily while on heparin  Chinita Greenland PharmD Clinical Pharmacist 01/10/2022

## 2022-01-10 NOTE — Progress Notes (Signed)
Progress Note   Patient: Cassandra Brown NKN:[id number] DOB: [birth year] DOA: 01/05/2022     5 DOS: the patient was seen and examined on 01/10/2022   Brief hospital course: 56 y.o. female with medical history significant for HFpEF, chronic pain, rheumatoid arthritis on prednisone and methotrexate, GPA of the lung and asthma and prone to nose bleeds, who presented to the ER on 01/04/2022  with about one week history of multiple symptoms including sore and scratchy throat, fever and chills, nausea, vomiting and diarrhea as well as dyspnea and cough productive of yellowish sputum.  She stated that she had 2 vaccine injections and 2 booster once for COVID-19.    In the ED patient was febrile, tachypneic, requiring supplemental oxygen.  Covid-19 PCR Positive.  CXR with b/l opacities, partially chronic scarrying due to GPA.   CTA chest/abdomen/pelvis showed:   1. No CT evidence of pulmonary embolism. 2. Multifocal pneumonia, likely viral or atypical in etiology. 3. No acute intra-abdominal or pelvic pathology. 4. Fatty liver. 5. Aortic Atherosclerosis 6.  Emphysema.   Assessment and Plan * Pneumonia due to COVID-19 virus- (present on admission) POA.  +Covid-19 PCR and multifocal opacities on imaging, hypoxia and met sepsis on admission with fever, tachypnea.  Elevated procal, suspect concurrent / secondary bacterial infection. Now with acute VTE as complication.   1/13: D-dimer increased significantly up to 6.75 today.  Patient had declined Lovenox and it appears as not been wearing SCDs as ordered 1/14: D-dimer > 20. CTA ordered but pt initially refused, later agreed once high risk of the situation explained in great detail.  Heparin drip ordered first thing this AM, pt also refused this until later this afternoon. Doppler US with LLE DVT.  CTA chest positive for multiple bilateral nonocclusive segmental Pes 1/15: Patient tolerating heparin drip, on 4 to 5 L of oxygen -- Completed course of  Rocephin/Zithromax -- Completed remdesivir -- Continue baricitinib -- Initially on IV steroids>> prednisone 50 mg -- Reduce prednisone to 40 mg, wean --Oxygen supplementation per protocol --Monitor inflammatory markers, CBC, CMP --Continue vitamins, ASA, as needed bronchodilator --Airborne and contact precautions  Sepsis ([place])- (present on admission) POA with fevers, tachycardia, tachypnea, due to Covid PNA. Sepsis physiology has improved. Treated per protocol and abx and IV fluids on admission. Monitor hemodynamics. Treat infection as outlined.   Acute respiratory failure with hypoxia ([place])- (present on admission) Due to Covid PNA and now acute PEs.   No baseline need for oxygen.    1/15: Requiring 4 to 5 L/min O2 --Supplement O2 to keep sats >90% --Wean O2 as tolerated --Incentive spirometry  Asthma- (present on admission) Stable, no wheezing to suggest exacerbation.  Acute respiratory issues and hypoxia due to Covid. High risk for exacerbation. On steroids given hypoxia with Covid. As needed albuterol.   Atrial fibrillation, chronic ([place])- (present on admission) HR controlled.  Status post watchman device placement.   No longer on anticoagulation after watchman.  Did not tolerate anticoagulation due to bleeding (severe epistaxis with GPA). --Telemetry --Continue sotalol -- Now on heparin drip due to PE/DVT  Chronic diastolic CHF (congestive heart failure) ([place])- (present on admission) Appears overall euvolemic. Monitor volume status. Prior echo in care everywhere from April 2022 at [facility] showed watchman device well-seated, normal LV and RV systolic function.  Coronary artery disease involving native heart with angina pectoris ([place])- (present on admission) Stable, no chest pain. 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  Chronic pain  syndrome- (present on admission) Due to RA and OA of multiple joints, lumbar radiculopathy. --Continue home pain regimen  Normocytic anemia- (present on admission) Hbg stable.  Monitor.  Rheumatoid arthritis ([place])- (present on admission) Stable.  Continue steroid and methotrexate.  (takes prednisone 5 mg daily, currently on 40 mg)  Osteoarthritis of multiple joints- (present on admission) Home pain regimen continued. Mobilize, OOB as much as tolerated.  Deep vein thrombosis (DVT) of left lower extremity (HCC) Doppler U/S of the lower extremities on 1/14 showed an isolated left peroneal (calf) DVT. 2. Negative for right lower extremity DVT. -- Continue heparin drip -- Transition to Eliquis in 24 to 48 hours of tolerating heparin without bleeding  Positive D dimer- (present on admission) D-dimer has significantly increased, today > 20. Pt had declined Lovenox for VTE prophylaxis due to prior severe epistaxis, prone to bleedings.  SCD's ordered but unclear if being used.  Pt is ambulatory around the room.  Pt counseled regarding high risk of VTE as a common complication of [id number]. Found to have left lower extremity DVT and multiple bilateral nonocclusive segmental PEs. -- Heparin drip  Hypokalemia Persistent the past several days despite replacement.  K 3.4>> 3.0>> 2.8>> 2.6 today. Have been replacing daily. --ordered K 40 mEq x 3 today --Monitor BMP and replace further as needed.  Hypophosphatemia Resolved.  Phos 2.4 on 1/14 was replaced, previously replaced on 1/12. --Monitor level and replace further as needed.  Epigastric abdominal pain- (present on admission) Patient reports approximately 2-week history of epigastric abdominal pain that is constant, associated nausea with occasional vomiting, altered taste.,  Very poor p.o. intake tolerance. Scheduled for EGD and colonoscopy as outpatient on 02/04/2022. She reports taking oral PPI twice daily at home. CT abdomen pelvis  without any acute findings. Suspect due to gastritis and/or peptic ulcer disease. -- Continue Carafate -- IV PPI twice daily for now -- Antiemetics as needed -- IV fluids stopped on 1/11 -- Diet as tolerated Given current respiratory infection and oxygen requirement, unable to do procedures at this time.     Subjective: Patient was sleeping comfortably when I entered the room this morning.  She woken reported she continues to feel lousy.  Still with some shortness of breath on exertion.  Ongoing stomach issues but overall these have been unchanged.  Tolerating p.o. intake.  Hopes to go home very soon.  We discussed transitioning from heparin to Eliquis which she reports having tolerated better than other anticoagulants in the past.  Objective Vitals reviewed and notable for oxygen requirement of 4 L/min, mild tachypnea.  No fevers.  BP stable.  General exam: awake, alert, no acute distress HEENT: atraumatic, clear conjunctiva, anicteric sclera, moist mucus membranes, hearing grossly normal  Respiratory system: CTAB, no wheezes, rales or rhonchi, normal respiratory effort at rest, 4 L/min nasal cannula O2. Cardiovascular system: normal S1/S2,  RRR, no JVD, murmurs, rubs, gallops, no pedal edema.   Central nervous system: A&O x3. no gross focal neurologic deficits, normal speech Extremities: moves all, no edema, normal tone Skin: dry, intact, normal temperature, normal color, No rashes, lesions or ulcers seen on visualized skin Psychiatry: normal mood, congruent affect, judgement and insight appear normal   Data Reviewed:  Labs reviewed and notable for potassium level continues to decline despite replacement, today 2.6, CO2 33, calcium 8.0, albumin 2.3, CRP increased to 17.3 from 8.0, D-dimer above 20.  Family Communication: None at bedside.  Patient has been updating  her mother.  Disposition: Status is: Inpatient  Remains inpatient appropriate because: Severity of illness requiring  supplemental oxygen and IV heparin as outlined above         Time spent: 35 minutes  Author: Ezekiel Slocumb, DO 01/10/2022 3:25 PM  For on call review [email or web address].

## 2022-01-10 NOTE — Progress Notes (Signed)
ANTICOAGULATION CONSULT NOTE  Pharmacy Consult for heparin infusion Indication: DVT  Patient Measurements: Height: 5\' 2"  (157.5 cm) Weight: 89.7 kg (197 lb 11.2 oz) IBW/kg (Calculated) : 50.1  Vital Signs: Temp: 97.7 F (36.5 C) (01/15 2122) BP: 134/57 (01/15 2122) Pulse Rate: 57 (01/15 2122)  Labs: Recent Labs    01/08/22 0503 01/09/22 0607 01/09/22 1655 01/10/22 0554 01/10/22 1416 01/10/22 2047  HGB 10.2* 10.3*  --  10.1*  --   --   HCT 31.2* 30.7*  --  30.1*  --   --   PLT 320 302  --  269  --   --   HEPARINUNFRC  --   --    < > 0.75* 0.58 0.62  CREATININE 0.68 0.61  --  0.65  --   --    < > = values in this interval not displayed.     Estimated Creatinine Clearance: 81.7 mL/min (by C-G formula based on SCr of 0.65 mg/dL).   Medical History: Past Medical History:  Diagnosis Date   Allergic rhinitis due to pollen    Angina pectoris (HCC)    CHF (congestive heart failure) (HCC)    Chronic maxillary sinusitis    Chronic pain syndrome    BACK   Chronic right sacroiliac joint pain    Collagen vascular disease (HCC)    Coronary artery abnormality    Dysrhythmia    AFIB,  FREQ PVC   Gastroparesis    GERD (gastroesophageal reflux disease)    History of kidney stones    Leukocytosis    Obesity    Patent foramen ovale    Rheumatoid arthritis (HCC)    OSTEOARTHRITIS   Sleep apnea    Spinal stenosis of lumbar region    Unequal leg length    Vasculitis (HCC)    Wegener's disease, pulmonary     Medications:  Scheduled:   aspirin EC  81 mg Oral Daily   [START ON 01/11/2022] baricitinib  4 mg Oral Daily   carisoprodol  350 mg Oral TID   cholecalciferol  1,000 Units Oral Daily   feeding supplement  237 mL Oral TID BM   folic acid  1 mg Oral Daily   furosemide  20 mg Oral Daily   guaiFENesin  600 mg Oral BID   HYDROcodone-acetaminophen  1 tablet Oral Q6H   methotrexate  25 mg Oral Weekly   montelukast  10 mg Oral QHS   multivitamin with minerals  1  tablet Oral Daily   pantoprazole (PROTONIX) IV  40 mg Intravenous Q12H   pentoxifylline  400 mg Oral BID   predniSONE  40 mg Oral Q breakfast   pregabalin  75 mg Oral BID   propranolol ER  80 mg Oral QHS   rosuvastatin  10 mg Oral QHS   sotalol  40 mg Oral BID   sucralfate  1 g Oral BID AC   tiotropium  1 capsule Inhalation Daily    Assessment: 56 y.o. female with medical history significant for HFpEF, GERD, gastroparesis, chronic pain, rheumatoid arthritis on prednisone and methotrexate, Wegener's granulomatosis of the lung and asthma who is admitted with COVID 19. Pt refusing CTA, but D-dimer spiked to >20.  She refused Lovenox shots and not wearing SCD's. Treating empirically for VTE since she refuses evaluation.   Baseline Labs: aPTT - 42s INR -  1.1 Hgb - 13.4>11.3> (slow down-trend over 6d)>10.3 Plts - 302 (stable/WNL) D-dimer: 6.75 > 20+  Goal of Therapy:  Heparin level  0.3-0.7 units/ml Monitor platelets by anticoagulation protocol: Yes   Date Time HL Rate/Comment 1/14 1655 0.62 1500 un/hr  1/15 0554 0.75 Suprather, Decrease to 1400 u/hr 1/15 1416 0.58 Thera, Cont 1400 u/hr   1/15 2047 0.62 Therapeutic x 2  Plan:  HL therapeutic x 2 with heparin rate at 1400 units/hr Will continue current rate Check HL with AM labs Continue to monitor H&H and platelets daily while on heparin  Miron Marxen Rodriguez-Guzman PharmD, BCPS 01/10/2022 10:31 PM

## 2022-01-10 NOTE — Progress Notes (Signed)
ANTICOAGULATION CONSULT NOTE  Pharmacy Consult for heparin infusion Indication: DVT  Patient Measurements: Height: 5\' 2"  (157.5 cm) Weight: 89.7 kg (197 lb 11.2 oz) IBW/kg (Calculated) : 50.1  Vital Signs: Temp: 98.2 F (36.8 C) (01/15 0540) BP: 129/75 (01/15 0540) Pulse Rate: 61 (01/15 0540)  Labs: Recent Labs    01/08/22 0503 01/09/22 0607 01/09/22 1655 01/09/22 2310 01/10/22 0554 01/10/22 1416  HGB 10.2* 10.3*  --   --  10.1*  --   HCT 31.2* 30.7*  --   --  30.1*  --   PLT 320 302  --   --  269  --   HEPARINUNFRC  --   --    < > 0.67 0.75* 0.58  CREATININE 0.68 0.61  --   --  0.65  --    < > = values in this interval not displayed.     Estimated Creatinine Clearance: 81.7 mL/min (by C-G formula based on SCr of 0.65 mg/dL).   Medical History: Past Medical History:  Diagnosis Date   Allergic rhinitis due to pollen    Angina pectoris (HCC)    CHF (congestive heart failure) (HCC)    Chronic maxillary sinusitis    Chronic pain syndrome    BACK   Chronic right sacroiliac joint pain    Collagen vascular disease (HCC)    Coronary artery abnormality    Dysrhythmia    AFIB,  FREQ PVC   Gastroparesis    GERD (gastroesophageal reflux disease)    History of kidney stones    Leukocytosis    Obesity    Patent foramen ovale    Rheumatoid arthritis (HCC)    OSTEOARTHRITIS   Sleep apnea    Spinal stenosis of lumbar region    Unequal leg length    Vasculitis (HCC)    Wegener's disease, pulmonary     Medications:  Scheduled:   aspirin EC  81 mg Oral Daily   [START ON 01/11/2022] baricitinib  4 mg Oral Daily   carisoprodol  350 mg Oral TID   cholecalciferol  1,000 Units Oral Daily   feeding supplement  237 mL Oral TID BM   folic acid  1 mg Oral Daily   furosemide  20 mg Oral Daily   guaiFENesin  600 mg Oral BID   HYDROcodone-acetaminophen  1 tablet Oral Q6H   methotrexate  25 mg Oral Weekly   montelukast  10 mg Oral QHS   multivitamin with minerals  1  tablet Oral Daily   pantoprazole (PROTONIX) IV  40 mg Intravenous Q12H   pentoxifylline  400 mg Oral BID   phosphorus  500 mg Oral TID   potassium chloride  40 mEq Oral Q4H   predniSONE  40 mg Oral Q breakfast   pregabalin  75 mg Oral BID   propranolol ER  80 mg Oral QHS   rosuvastatin  10 mg Oral QHS   sotalol  40 mg Oral BID   sucralfate  1 g Oral BID AC   tiotropium  1 capsule Inhalation Daily    Assessment: 56 y.o. female with medical history significant for HFpEF, GERD, gastroparesis, chronic pain, rheumatoid arthritis on prednisone and methotrexate, Wegener's granulomatosis of the lung and asthma who is admitted with COVID 19. Pt refusing CTA, but D-dimer spiked to >20.  She refused Lovenox shots and not wearing SCD's. Treating empirically for VTE since she refuses evaluation.  Date Time HL Rate/Comment 1/14 1655 0.62 1500 un/hr  1/15 0554 0.75 Suprather, Decrease  to 1400 u/hr 1/15 1416 0.58 Thera, Cont 1400 u/hr       Baseline Labs: aPTT - 42s INR -  1.1 Hgb - 13.4>11.3> (slow down-trend over 6d)>10.3 Plts - 302 (stable/WNL) D-dimer: 6.75 > 20+    Goal of Therapy:  Heparin level 0.3-0.7 units/ml Monitor platelets by anticoagulation protocol: Yes   Plan:  1/15 1416 HL=0.58 Therapeutic Continue heparin infusion at 1400 units/hr Check anti-Xa level in 6 hours to confirm and daily once consecutively therapeutic Continue to monitor H&H and platelets daily while on heparin  Chinita Greenland PharmD Clinical Pharmacist 01/10/2022

## 2022-01-10 NOTE — Progress Notes (Signed)
MD notified of pt's low Potassium level of 2.6, new orders obtained.

## 2022-01-11 LAB — CBC
HCT: 30.1 % — ABNORMAL LOW (ref 36.0–46.0)
Hemoglobin: 9.8 g/dL — ABNORMAL LOW (ref 12.0–15.0)
MCH: 27.7 pg (ref 26.0–34.0)
MCHC: 32.6 g/dL (ref 30.0–36.0)
MCV: 85 fL (ref 80.0–100.0)
Platelets: 261 10*3/uL (ref 150–400)
RBC: 3.54 MIL/uL — ABNORMAL LOW (ref 3.87–5.11)
RDW: 14.2 % (ref 11.5–15.5)
WBC: 7.3 10*3/uL (ref 4.0–10.5)
nRBC: 0 % (ref 0.0–0.2)

## 2022-01-11 LAB — BASIC METABOLIC PANEL
Anion gap: 7 (ref 5–15)
BUN: 15 mg/dL (ref 6–20)
CO2: 33 mmol/L — ABNORMAL HIGH (ref 22–32)
Calcium: 8.2 mg/dL — ABNORMAL LOW (ref 8.9–10.3)
Chloride: 103 mmol/L (ref 98–111)
Creatinine, Ser: 0.54 mg/dL (ref 0.44–1.00)
GFR, Estimated: >60 mL/min (ref >60)
Glucose, Bld: 90 mg/dL (ref 70–99)
Potassium: 3.5 mmol/L (ref 3.5–5.1)
Sodium: 143 mmol/L (ref 135–145)

## 2022-01-11 LAB — HEPARIN LEVEL (UNFRACTIONATED): Heparin Unfractionated: 0.78 IU/mL — ABNORMAL HIGH (ref 0.30–0.70)

## 2022-01-11 LAB — C-REACTIVE PROTEIN: CRP: 8.8 mg/dL — ABNORMAL HIGH (ref <1.0)

## 2022-01-11 LAB — PHOSPHORUS: Phosphorus: 3.3 mg/dL (ref 2.5–4.6)

## 2022-01-11 MED ORDER — PANTOPRAZOLE SODIUM 40 MG PO TBEC
40.0000 mg | DELAYED_RELEASE_TABLET | Freq: Two times a day (BID) | ORAL | Status: DC
  Administered 2022-01-12: 40 mg via ORAL
  Filled 2022-01-11: qty 1

## 2022-01-11 MED ORDER — SENNOSIDES-DOCUSATE SODIUM 8.6-50 MG PO TABS
1.0000 | ORAL_TABLET | Freq: Two times a day (BID) | ORAL | Status: DC
  Filled 2022-01-11: qty 1

## 2022-01-11 MED ORDER — APIXABAN 5 MG PO TABS: 5.0000 mg | ORAL_TABLET | Freq: Two times a day (BID) | ORAL | Status: DC

## 2022-01-11 MED ORDER — FLUTICASONE PROPIONATE 50 MCG/ACT NA SUSP
2.0000 | Freq: Every day | NASAL | Status: DC
  Administered 2022-01-11: 2 via NASAL
  Filled 2022-01-11: qty 16

## 2022-01-11 MED ORDER — POLYETHYLENE GLYCOL 3350 17 G PO PACK
17.0000 g | PACK | Freq: Every day | ORAL | Status: DC
  Filled 2022-01-11: qty 1

## 2022-01-11 MED ORDER — APIXABAN 5 MG PO TABS
10.0000 mg | ORAL_TABLET | Freq: Two times a day (BID) | ORAL | Status: DC
  Administered 2022-01-11 – 2022-01-12 (×3): 10 mg via ORAL
  Filled 2022-01-11 (×3): qty 2

## 2022-01-11 MED ORDER — SENNOSIDES-DOCUSATE SODIUM 8.6-50 MG PO TABS: 1.0000 | ORAL_TABLET | Freq: Two times a day (BID) | ORAL | Status: DC | PRN

## 2022-01-11 MED ORDER — POLYETHYLENE GLYCOL 3350 17 G PO PACK: 17.0000 g | PACK | Freq: Every day | ORAL | Status: DC | PRN

## 2022-01-11 MED ORDER — LORATADINE 10 MG PO TABS
10.0000 mg | ORAL_TABLET | Freq: Every day | ORAL | Status: DC
  Administered 2022-01-11 – 2022-01-12 (×2): 10 mg via ORAL
  Filled 2022-01-11 (×2): qty 1

## 2022-01-11 NOTE — Progress Notes (Signed)
ANTICOAGULATION CONSULT NOTE  Pharmacy Consult for heparin infusion Indication: DVT  Patient Measurements: Height: 5\' 2"  (157.5 cm) Weight: 89.7 kg (197 lb 11.2 oz) IBW/kg (Calculated) : 50.1  Vital Signs: Temp: 97.7 F (36.5 C) (01/15 2122) BP: 139/66 (01/16 0642) Pulse Rate: 55 (01/16 0642)  Labs: Recent Labs    01/09/22 0607 01/09/22 1655 01/10/22 0554 01/10/22 1416 01/10/22 2047 01/11/22 0644  HGB 10.3*  --  10.1*  --   --  9.8*  HCT 30.7*  --  30.1*  --   --  30.1*  PLT 302  --  269  --   --  261  HEPARINUNFRC  --    < > 0.75* 0.58 0.62 0.78*  CREATININE 0.61  --  0.65  --   --  0.54   < > = values in this interval not displayed.     Estimated Creatinine Clearance: 81.7 mL/min (by C-G formula based on SCr of 0.54 mg/dL).   Medical History: Past Medical History:  Diagnosis Date   Allergic rhinitis due to pollen    Angina pectoris (HCC)    CHF (congestive heart failure) (HCC)    Chronic maxillary sinusitis    Chronic pain syndrome    BACK   Chronic right sacroiliac joint pain    Collagen vascular disease (HCC)    Coronary artery abnormality    Dysrhythmia    AFIB,  FREQ PVC   Gastroparesis    GERD (gastroesophageal reflux disease)    History of kidney stones    Leukocytosis    Obesity    Patent foramen ovale    Rheumatoid arthritis (HCC)    OSTEOARTHRITIS   Sleep apnea    Spinal stenosis of lumbar region    Unequal leg length    Vasculitis (HCC)    Wegener's disease, pulmonary     Medications:  Scheduled:   aspirin EC  81 mg Oral Daily   baricitinib  4 mg Oral Daily   carisoprodol  350 mg Oral TID   cholecalciferol  1,000 Units Oral Daily   feeding supplement  237 mL Oral TID BM   folic acid  1 mg Oral Daily   furosemide  20 mg Oral Daily   guaiFENesin  600 mg Oral BID   HYDROcodone-acetaminophen  1 tablet Oral Q6H   methotrexate  25 mg Oral Weekly   montelukast  10 mg Oral QHS   multivitamin with minerals  1 tablet Oral Daily    pantoprazole (PROTONIX) IV  40 mg Intravenous Q12H   pentoxifylline  400 mg Oral BID   predniSONE  40 mg Oral Q breakfast   pregabalin  75 mg Oral BID   propranolol ER  80 mg Oral QHS   rosuvastatin  10 mg Oral QHS   sotalol  40 mg Oral BID   sucralfate  1 g Oral BID AC   tiotropium  1 capsule Inhalation Daily    Assessment: 56 y.o. female with medical history significant for HFpEF, GERD, gastroparesis, chronic pain, rheumatoid arthritis on prednisone and methotrexate, Wegener's granulomatosis of the lung and asthma who is admitted with COVID 19. Pt refusing CTA, but D-dimer spiked to >20.  She refused Lovenox shots and not wearing SCD's. Treating empirically for VTE since she refuses evaluation.   Goal of Therapy:  Heparin level 0.3-0.7 units/ml Monitor platelets by anticoagulation protocol: Yes   Date Time HL Rate/Comment 1/14 1655 0.62 1500 un/hr  1/15 0554 0.75 Suprather, Decrease to 1400 u/hr 1/15 1416  0.58 Thera, Cont 1400 u/hr   1/15 2047 0.62 Therapeutic x 2 1/16 0644 0.78 Suprather  Plan:  HL supratherapeutic. Will decrease heparin rate to 1300 units/hr Check HL 6 hours after rate change Continue to monitor H&H and platelets daily while on heparin  Sherilyn Banker, PharmD Clinical Pharmacist  01/11/2022 7:34 AM

## 2022-01-11 NOTE — Progress Notes (Signed)
Progress Note   Patient: Cassandra Brown FTD:[id number] DOB: 03/26/1966 DOA: 01/05/2022     6 DOS: the patient was seen and examined on 01/11/2022   Brief hospital course: 56 y.o. female with medical history significant for HFpEF, chronic pain, rheumatoid arthritis on prednisone and methotrexate, GPA of the lung and asthma and prone to nose bleeds, who presented to the ER on 01/04/2022  with about one week history of multiple symptoms including sore and scratchy throat, fever and chills, nausea, vomiting and diarrhea as well as dyspnea and cough productive of yellowish sputum.  She stated that she had 2 vaccine injections and 2 booster once for COVID-19.    In the ED patient was febrile, tachypneic, requiring supplemental oxygen.  Covid-19 PCR Positive.  CXR with b/l opacities, partially chronic scarrying due to GPA.   CTA chest/abdomen/pelvis showed:   1. No CT evidence of pulmonary embolism. 2. Multifocal pneumonia, likely viral or atypical in etiology. 3. No acute intra-abdominal or pelvic pathology. 4. Fatty liver. 5. Aortic Atherosclerosis 6.  Emphysema.   Assessment and Plan * Pneumonia due to COVID-19 virus- (present on admission) POA.  +Covid-19 PCR and multifocal opacities on imaging, hypoxia and met sepsis on admission with fever, tachypnea.  Elevated procal, suspect concurrent / secondary bacterial infection. Now with acute VTE as complication.   1/13: D-dimer increased significantly up to 6.75 today.  Patient had declined Lovenox and it appears as not been wearing SCDs as ordered 1/14: D-dimer > 20. CTA ordered but pt initially refused, later agreed once high risk of the situation explained in great detail.  Heparin drip ordered first thing this AM, pt also refused this until later this afternoon. Doppler US with LLE DVT.  CTA chest positive for multiple bilateral nonocclusive segmental Pes 1/15: Patient tolerating heparin drip, on 4 to 5 L of oxygen -- Completed course of  Rocephin/Zithromax -- Completed remdesivir -- Continue baricitinib -- Initially on IV steroids>> prednisone 50 mg -- On prednisone to 40 mg, wean --Oxygen supplementation per protocol --Monitor inflammatory markers, CBC, CMP --Continue vitamins, ASA, as needed bronchodilator --Airborne and contact precautions  Sepsis ([place])- (present on admission) POA with fevers, tachycardia, tachypnea, due to Covid PNA. Sepsis physiology has improved. Treated per protocol and abx and IV fluids on admission. Monitor hemodynamics. Treat infection as outlined.   Acute respiratory failure with hypoxia ([place])- (present on admission) Due to Covid PNA and now acute PEs.   No baseline need for oxygen.    1/16: Still on 4 to 5 L/min O2 --Supplement O2 to keep sats >90% --Wean O2 as tolerated --Incentive spirometry  Asthma- (present on admission) Stable, no wheezing to suggest exacerbation.  Acute respiratory issues and hypoxia due to Covid. High risk for exacerbation. On steroids given hypoxia with Covid. As needed albuterol.   Atrial fibrillation, chronic ([place])- (present on admission) HR controlled.  Status post watchman device placement.   No longer on anticoagulation after watchman.  Did not tolerate anticoagulation due to bleeding (severe epistaxis with GPA). --Telemetry --Continue sotalol -- Transitioning heparin to Eliquis (given DVT/PE's)  Chronic diastolic CHF (congestive heart failure) ([place])- (present on admission) Appears overall euvolemic. Monitor volume status. Prior echo in care everywhere from April 2022 at [facility] showed watchman device well-seated, normal LV and RV systolic function.  Coronary artery disease involving native heart with angina pectoris ([place])- (present on admission) Stable, no chest pain. 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  Chronic  pain syndrome- (present on admission) Due to RA and OA of multiple joints, lumbar radiculopathy. --Continue home pain regimen  Normocytic anemia- (present on admission) Hbg stable.  Monitor.  Rheumatoid arthritis ([place])- (present on admission) Stable.  Continue steroid and methotrexate.  (takes prednisone 5 mg daily, currently on 40 mg)  Osteoarthritis of multiple joints- (present on admission) Home pain regimen continued. Mobilize, OOB as much as tolerated.  Deep vein thrombosis (DVT) of left lower extremity (HCC) Doppler U/S of the lower extremities on 1/14 showed an isolated left peroneal (calf) DVT. 2. Negative for right lower extremity DVT. -- Transition heparin >> Eliquis -- Monitor for bleeding given hx  Positive D dimer- (present on admission) D-dimer significantly increased to > 20. Pt had declined Lovenox for VTE prophylaxis due to prior severe epistaxis, prone to bleedings.  SCD's ordered but unclear if being used.  Pt is ambulatory around the room.  Pt counseled regarding high risk of VTE as a common complication of [id number]. Found to have left lower extremity DVT and multiple bilateral nonocclusive segmental PEs. -- Heparin drip >> Eliquis today  Hypokalemia Persistent the past several days despite replacement.  K 3.4>> 3.0>> 2.8>> 2.6>> 3.5 today. Have been replacing daily --Hold replacement today --Monitor BMP and replace further as needed.  Hypophosphatemia Resolved.  Phos 2.4 on 1/14 was replaced, previously replaced on 1/12. --Monitor level and replace further as needed.  Epigastric abdominal pain- (present on admission) Patient reports approximately 2-week history of epigastric abdominal pain that is constant, associated nausea with occasional vomiting, altered taste.,  Very poor p.o. intake tolerance. Scheduled for EGD and colonoscopy as outpatient on 02/04/2022. She reports taking oral PPI twice daily at home. CT abdomen pelvis without any acute  findings. Suspect due to gastritis and/or peptic ulcer disease. -- Continue Carafate -- Treated with IV PPI BID for a few days -- Resume oral Protonix BID -- Antiemetics as needed -- IV fluids stopped on 1/11 -- Diet as tolerated Given current respiratory infection and oxygen requirement, unable to do procedures at this time.     Subjective: Pt feels okay today. Still some dyspnea on exertion although it has improved.  Stomach still bothers her but now eating better than she had been.  No F/C, N/V.  Requests stool softeners be made PRN.  No other acute complaints.  Hopes to go home very soon.  Objective Vitals reviewed and notable for 5 L/min oxygen, HR 55, no fevers, BP stable.    General exam: awake, alert, no acute distress HEENT: moist mucus membranes, hearing grossly normal  Respiratory system: CTAB, no wheezes, rales or rhonchi, normal respiratory effort. Cardiovascular system: normal S1/S2,  RRR, no JVD, murmurs, rubs, gallops,  no pedal edema.   Central nervous system: A&O x4. no gross focal neurologic deficits, normal speech Extremities: moves all , no edema, normal tone Skin: dry, intact, normal temperature Psychiatry: normal mood, congruent affect, judgement and insight appear normal   Data Reviewed:  Labs reviewed and notable for CRP 17.3>>8.8, Hbg stable 9.8  Family Communication: None at bedside. Pt updating her mom.  No new medical updates.  Disposition: Status is: Inpatient  Remains inpatient appropriate because: severity of illness with high oxygen requirement.  Plan to discharge once stable on 3 L/min or less o2 at rest.  Expect 24-48 more hours.         Time spent: 35 minutes  Author: Ezekiel Slocumb, DO 01/11/2022 8:00 PM  For  on call review [email or web address].

## 2022-01-12 LAB — BASIC METABOLIC PANEL
Anion gap: 8 (ref 5–15)
BUN: 11 mg/dL (ref 6–20)
CO2: 33 mmol/L — ABNORMAL HIGH (ref 22–32)
Calcium: 8.9 mg/dL (ref 8.9–10.3)
Chloride: 103 mmol/L (ref 98–111)
Creatinine, Ser: 0.65 mg/dL (ref 0.44–1.00)
GFR, Estimated: >60 mL/min (ref >60)
Glucose, Bld: 92 mg/dL (ref 70–99)
Potassium: 4.5 mmol/L (ref 3.5–5.1)
Sodium: 144 mmol/L (ref 135–145)

## 2022-01-12 LAB — CBC
HCT: 32.4 % — ABNORMAL LOW (ref 36.0–46.0)
Hemoglobin: 10.3 g/dL — ABNORMAL LOW (ref 12.0–15.0)
MCH: 27.5 pg (ref 26.0–34.0)
MCHC: 31.8 g/dL (ref 30.0–36.0)
MCV: 86.4 fL (ref 80.0–100.0)
Platelets: 280 10*3/uL (ref 150–400)
RBC: 3.75 MIL/uL — ABNORMAL LOW (ref 3.87–5.11)
RDW: 14 % (ref 11.5–15.5)
WBC: 7.4 10*3/uL (ref 4.0–10.5)
nRBC: 0 % (ref 0.0–0.2)

## 2022-01-12 LAB — C-REACTIVE PROTEIN: CRP: 6.2 mg/dL — ABNORMAL HIGH (ref <1.0)

## 2022-01-12 MED ORDER — HYDROCOD POLST-CPM POLST ER 10-8 MG/5ML PO SUER: 5.0000 mL | Freq: Two times a day (BID) | ORAL | 0 refills | Status: DC | PRN

## 2022-01-12 MED ORDER — PREDNISONE 10 MG PO TABS
ORAL_TABLET | ORAL | 0 refills | Status: AC
Start: 2022-01-12 — End: 2022-01-18

## 2022-01-12 MED ORDER — ALBUTEROL SULFATE HFA 108 (90 BASE) MCG/ACT IN AERS: 1.0000 | INHALATION_SPRAY | RESPIRATORY_TRACT | 0 refills | Status: AC | PRN

## 2022-01-12 MED ORDER — APIXABAN 5 MG PO TABS: 10.0000 mg | ORAL_TABLET | Freq: Two times a day (BID) | ORAL | 2 refills | Status: DC

## 2022-01-12 MED ORDER — APIXABAN 5 MG PO TABS: ORAL_TABLET | ORAL | 0 refills | Status: DC

## 2022-01-12 MED ORDER — APIXABAN 5 MG PO TABS
ORAL_TABLET | ORAL | 1 refills | Status: DC
Start: 2022-01-12 — End: 2022-01-12

## 2022-01-12 MED ORDER — GUAIFENESIN ER 600 MG PO TB12: 600.0000 mg | ORAL_TABLET | Freq: Two times a day (BID) | ORAL | 0 refills | Status: DC

## 2022-01-12 MED ORDER — ENSURE ENLIVE PO LIQD: 237.0000 mL | Freq: Three times a day (TID) | ORAL | 12 refills | Status: DC

## 2022-01-12 MED ORDER — LORATADINE 10 MG PO TABS: 10.0000 mg | ORAL_TABLET | Freq: Every day | ORAL | 0 refills | Status: DC

## 2022-01-12 MED ORDER — FLUTICASONE PROPIONATE 50 MCG/ACT NA SUSP: 2.0000 | Freq: Every day | NASAL | 2 refills | Status: DC

## 2022-01-12 NOTE — TOC Transition Note (Signed)
Transition of Care [facility]) - CM/SW Discharge Note   Patient Details  Name: Cassandra Brown MRN: [id number] Date of Birth: 1966-01-24  [facility] CM/SW Contact:  Beverly Sessions, RN Phone Number: 01/12/2022, 10:29 AM   Clinical Narrative:     Patient to discharge today Patient qualifies for home O2.  Referral made to [facility] with [employer]. Portable O2 to be delivered to room prior to discharge  Per MD no further needs for DC from [facility]        Patient Goals and CMS Choice        Discharge Placement                       Discharge Plan and Services                                     Social Determinants of Health (SDOH) Interventions     Readmission Risk Interventions Readmission Risk Prevention Plan 01/12/2022  Transportation Screening Complete  Medication Review ([place]) Complete  HRI or [place] Complete  SW Recovery Care/Counseling Consult Complete  Palliative Care Screening Not [place] Not Applicable  Some recent data might be hidden

## 2022-01-12 NOTE — Progress Notes (Signed)
Patient discharging to home with transport provided by parent to home.  Patient received AVS along with education regarding changes to medications and follow-up appointments with all questions and concerns answered by Primary RN.  PIVs removed prior to discharge with home O2 delivered to room.  Patient to be transported by primary RN off the unit.

## 2022-01-12 NOTE — Discharge Summary (Signed)
Physician Discharge Summary   Patient: Cassandra Brown MRN: [id number] DOB: [birth year]  Admit date:     01/05/2022  Discharge date: 01/12/22  Discharge Physician: Ezekiel Slocumb   PCP: Baxter Hire, MD   Recommendations at discharge:    Follow up with PCP in 1-2 weeks CBC/CMP in 1-2 weeks Follow up on PE/DVT and duration of anticoagulation  Discharge Diagnoses Principal Problem:   Pneumonia due to COVID-19 virus Active Problems:   Acute respiratory failure with hypoxia (HCC)   Sepsis (HCC)   Asthma   Atrial fibrillation, chronic (HCC)   Chronic diastolic CHF (congestive heart failure) (HCC)   Coronary artery disease involving native heart with angina pectoris (HCC)   Chronic pain syndrome   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 female with medical history significant for HFpEF, chronic pain, rheumatoid arthritis on prednisone and methotrexate, GPA of the lung and asthma and prone to nose bleeds, who presented to the ER on 01/04/2022  with about one week history of multiple symptoms including sore and scratchy throat, fever and chills, nausea, vomiting and diarrhea as well as dyspnea and cough productive of yellowish sputum.  She stated that she had 2 vaccine injections and 2 booster once for COVID-19.    In the ED patient was febrile, tachypneic, requiring supplemental oxygen.  Covid-19 PCR Positive.  CXR with b/l opacities, partially chronic scarrying due to GPA.   CTA chest/abdomen/pelvis showed:   1. No CT evidence of pulmonary embolism. 2. Multifocal pneumonia, likely viral or atypical in  etiology. 3. No acute intra-abdominal or pelvic pathology. 4. Fatty liver. 5. Aortic Atherosclerosis 6.  Emphysema.   Patient was treated for [id number]-[id number] pneumonia complicated by acute PE and DVT in setting up patient declining VTE prophylaxis as recommended.  Stable and tolerating Eliquis without bleeding issues.  Advised close PCP follow up after discharge.    * Pneumonia due to COVID-19 virus- (present on admission) POA.  +Covid-19 PCR and multifocal opacities on imaging, hypoxia and met sepsis on admission with fever, tachypnea.  Elevated procal, suspect concurrent / secondary bacterial infection. Now with acute VTE as complication.   1/13: D-dimer increased significantly up to 6.75 today.  Patient had declined Lovenox and it appears as not been wearing SCDs as ordered 1/14: D-dimer > 20. CTA ordered but pt initially refused, later agreed once high risk of the situation explained in great detail.  Heparin drip ordered first thing this AM, pt also refused this until later this afternoon. Doppler US with LLE DVT.  CTA chest positive for multiple bilateral nonocclusive segmental Pes 1/15: Patient tolerating heparin drip, on 4 to 5 L of oxygen -- Completed course of Rocephin/Zithromax -- Completed remdesivir -- Continue baricitinib -- Initially on IV steroids>> prednisone 50 mg -- On prednisone to 40 mg, wean --Oxygen supplementation per protocol --Monitor inflammatory markers, CBC, CMP --Continue vitamins, ASA, as needed bronchodilator --Airborne and contact precautions  Sepsis ([place])- (present on admission) POA with fevers, tachycardia, tachypnea, due to Covid PNA. Sepsis physiology has improved. Treated per protocol and abx and IV fluids on admission.  Monitor hemodynamics. Treat infection as outlined.   Acute respiratory failure with hypoxia ([place])- (present on admission) Due to Covid PNA and now acute PEs.   No baseline need for oxygen.    1/16: Still on 4 to 5 L/min  O2 --Supplement O2 to keep sats >90% --Wean O2 as tolerated --Incentive spirometry  Asthma- (present on admission) Stable, no wheezing to suggest exacerbation.  Acute respiratory issues and hypoxia due to Covid. High risk for exacerbation. On steroids given hypoxia with Covid. As needed albuterol.   Atrial fibrillation, chronic ([employer])- (present on admission) HR controlled.  Status post watchman device placement.   No longer on anticoagulation after watchman.  Did not tolerate anticoagulation due to bleeding (severe epistaxis with GPA). --Telemetry --Continue sotalol -- Transitioning heparin to Eliquis (given DVT/PE's)  Chronic diastolic CHF (congestive heart failure) ([place])- (present on admission) Appears overall euvolemic. Monitor volume status. Prior echo in care everywhere from April 2022 at [facility] showed watchman device well-seated, normal LV and RV systolic function.  Coronary artery disease involving native heart with angina pectoris ([place])- (present on admission) Stable, no chest pain. 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  Chronic pain syndrome- (present on admission) Due to RA and OA of multiple joints, lumbar radiculopathy. --Continue home pain regimen  Normocytic anemia- (present on admission) Hbg stable.  Monitor.  Rheumatoid arthritis ([place])- (present on admission) Stable.  Continue steroid and methotrexate.  (takes prednisone 5 mg daily, currently on 40 mg)  Osteoarthritis of multiple joints- (present on admission) Home pain regimen continued. Mobilize, OOB as much as tolerated.  Deep vein thrombosis (DVT) of left lower extremity (HCC) Doppler U/S of the lower extremities on 1/14 showed an isolated left peroneal (calf) DVT. 2. Negative for right lower extremity DVT. -- Transition heparin >> Eliquis -- Monitor for bleeding given  hx  Positive D dimer- (present on admission) D-dimer significantly increased to > 20. Pt had declined Lovenox for VTE prophylaxis due to prior severe epistaxis, prone to bleedings.  SCD's ordered but unclear if being used.  Pt is ambulatory around the room.  Pt counseled regarding high risk of VTE as a common complication of [id number]. Found to have left lower extremity DVT and multiple bilateral nonocclusive segmental PEs. -- Heparin drip >> Eliquis today  Hypokalemia Persistent the past several days despite replacement.  K 3.4>> 3.0>> 2.8>> 2.6>> 3.5 today. Have been replacing daily --Hold replacement today --Monitor BMP and replace further as needed.  Hypophosphatemia Resolved.  Phos 2.4 on 1/14 was replaced, previously replaced on 1/12. --Monitor level and replace further as needed.  Epigastric abdominal pain- (present on admission) Patient reports approximately 2-week history of epigastric abdominal pain that is constant, associated nausea with occasional vomiting, altered taste.,  Very poor p.o. intake tolerance. Scheduled for EGD and colonoscopy as outpatient on 02/04/2022. She reports taking oral PPI twice daily at home. CT abdomen pelvis without any acute findings. Suspect due to gastritis and/or peptic ulcer disease. -- Continue Carafate -- Treated with IV PPI BID for a few days -- Resume oral Protonix BID -- Antiemetics as needed -- IV fluids stopped on 1/11 -- Diet as tolerated Given current respiratory infection and oxygen requirement, unable to do procedures at this time.        Consultants: none Procedures performed: none  Disposition: Home Diet recommendation: Regular diet  DISCHARGE MEDICATION: Allergies as of 01/12/2022  Reactions   Cyclobenzaprine Hives   Ibuprofen Hives   Morphine Itching, Swelling   Other reaction(s): itching/swelling   Meperidine Itching, Swelling   Sulfa Antibiotics Swelling   Onion Itching   Can use onion powder         Medication List     TAKE these medications    Advair HFA 230-21 MCG/ACT inhaler Generic drug: fluticasone-salmeterol Inhale 2 puffs into the lungs 2 (two) times daily.   albuterol 108 (90 Base) MCG/ACT inhaler Commonly known as: VENTOLIN HFA Inhale 1-2 puffs into the lungs every 4 (four) hours as needed for wheezing or shortness of breath.   apixaban 5 MG Tabs tablet Commonly known as: Eliquis Take 2 tablets (74m) twice daily for 7 days, then 1 tablet (564m twice daily   aspirin EC 81 MG tablet Take 81 mg by mouth daily. Swallow whole.   Bac 50-325-40 MG tablet Generic drug: butalbital-acetaminophen-caffeine Take 1 tablet by mouth every 4 (four) hours as needed for headache.   carisoprodol 350 MG tablet Commonly known as: SOMA Take 350 mg by mouth 3 (three) times daily.   Centrum Performance Tabs Take 1 tablet by mouth daily.   chlorpheniramine-HYDROcodone 10-8 MG/5ML Suer Commonly known as: TUSSIONEX Take 5 mLs by mouth every 12 (twelve) hours as needed for cough.   cholecalciferol 25 MCG (1000 UNIT) tablet Commonly known as: VITAMIN D3 Take 1,000 Units by mouth daily.   clobetasol cream 0.05 % Commonly known as: TEMOVATE Apply 1 application topically 2 (two) times daily as needed (rash).   Dupixent 300 MG/2ML Sopn Generic drug: Dupilumab Inject 300 mg into the skin every 14 (fourteen) days.   Emgality 120 MG/ML Soaj Generic drug: Galcanezumab-gnlm Inject 120 mg into the skin every 28 (twenty-eight) days.   EPINEPHrine 0.3 mg/0.3 mL Soaj injection Commonly known as: EPI-PEN Inject 0.3 mg into the muscle as needed.   feeding supplement Liqd Take 237 mLs by mouth 3 (three) times daily between meals.   fluticasone 50 MCG/ACT nasal spray Commonly known as: FLONASE Place 2 sprays into both nostrils daily. Start taking on: January 18, 207893 folic acid 1 MG tablet Commonly known as: FOLVITE Take 1 mg by mouth daily.   furosemide 20 MG tablet Commonly  known as: LASIX Take 20 mg by mouth daily.   gentamicin ointment 0.1 % Commonly known as: GARAMYCIN Apply 1 application topically daily as needed (rash).   guaiFENesin 600 MG 12 hr tablet Commonly known as: MUCINEX Take 1 tablet (600 mg total) by mouth 2 (two) times daily.   HYDROcodone-acetaminophen 10-325 MG tablet Commonly known as: NORCO Take 1 tablet by mouth every 6 (six) hours as needed.   loratadine 10 MG tablet Commonly known as: CLARITIN Take 1 tablet (10 mg total) by mouth daily. Start taking on: January 13, 2022   methotrexate 2.5 MG tablet Take 25 mg by mouth once a week. Caution:Chemotherapy. Protect from light.   montelukast 10 MG tablet Commonly known as: SINGULAIR Take 10 mg by mouth at bedtime.   naloxone 4 MG/0.1ML Liqd nasal spray kit Commonly known as: NARCAN For opioid overdose or excessive sleepiness after taking narcotics   Nurtec 75 MG Tbdp Generic drug: Rimegepant Sulfate Take 75 mg by mouth daily as needed for migraine.   pantoprazole 40 MG tablet Commonly known as: PROTONIX Take 40 mg by mouth 2 (two) times daily.   pentoxifylline 400 MG CR tablet Commonly known as: TRENTAL Take 400 mg by mouth in the morning and  at bedtime.   pimecrolimus 1 % cream Commonly known as: ELIDEL Apply 1 application topically 2 (two) times daily as needed (rash).   predniSONE 10 MG tablet Commonly known as: DELTASONE Take 3 tablets (30 mg total) by mouth daily with breakfast for 2 days, THEN 2 tablets (20 mg total) daily with breakfast for 2 days, THEN 1 tablet (10 mg total) daily with breakfast for 2 days. Then resume usual 5 mg daily. Start taking on: January 12, 2022 What changed:  medication strength See the new instructions.   pregabalin 75 MG capsule Commonly known as: LYRICA Take 75 mg by mouth 2 (two) times daily.   promethazine 25 MG tablet Commonly known as: PHENERGAN Take 25 mg by mouth every 6 (six) hours as needed for nausea or  vomiting.   propranolol ER 80 MG 24 hr capsule Commonly known as: INDERAL LA Take 80 mg by mouth at bedtime.   rosuvastatin 10 MG tablet Commonly known as: CRESTOR Take 10 mg by mouth at bedtime.   sodium chloride 0.65 % Soln nasal spray Commonly known as: OCEAN Place 1 spray into both nostrils as needed for congestion.   sotalol 80 MG tablet Commonly known as: BETAPACE Take 40 mg by mouth 2 (two) times daily.   Spiriva Respimat 1.25 MCG/ACT Aers Generic drug: Tiotropium Bromide Monohydrate Inhale 2 puffs into the lungs daily.   sucralfate 1 g tablet Commonly known as: CARAFATE Take 1 g by mouth 2 (two) times daily before a meal.   triamcinolone cream 0.1 % Commonly known as: KENALOG Apply 1 application topically daily as needed (itching).               Durable Medical Equipment  (From admission, onward)           Start     Ordered   01/12/22 0948  For home use only DME oxygen  Once       Question Answer Comment  Length of Need 6 Months   Mode or (Route) Nasal cannula   Liters per Minute 4   Frequency Continuous (stationary and portable oxygen unit needed)   Oxygen delivery system Gas      01/12/22 0947            Follow-up Information     Baxter Hire, MD. Schedule an appointment as soon as possible for a visit in 1 week(s).   Specialty: Internal Medicine Why: Hospital follow up for [id number] complicated by acute PE's and DVT now on Eliquis Contact information: [place] [address] [phone]         Ottie Glazier, MD. Go on 01/25/2022.   Specialty: Pulmonary Disease Why: 10:15am appointment Contact information: [place] [address] [phone]                 Discharge Exam: Danley Danker Weights   01/04/22 1442 01/06/22 1430  Weight: 79.4 kg 89.7 kg   General exam: awake, alert, no acute distress HEENT: atraumatic, clear conjunctiva, anicteric sclera, moist mucus membranes,  hearing grossly normal  Respiratory system: CTAB, no wheezes, rales or rhonchi, normal respiratory effort. Cardiovascular system: normal S1/S2, RRR, no JVD, murmurs, rubs, gallops, no pedal edema.   Gastrointestinal system: soft, NT, ND, no HSM felt, +bowel sounds. Central nervous system: A&O x3. no gross focal neurologic deficits, normal speech Extremities: moves all, no edema, normal tone Skin: dry, intact, normal temperature Psychiatry: normal mood, congruent affect, judgement and insight appear normal  Condition at discharge: stable  The results of significant diagnostics from this hospitalization (including imaging, microbiology, ancillary and laboratory) are listed below for reference.   Imaging Studies: DG Chest 2 View  Result Date: 01/04/2022 CLINICAL DATA:  Suspected sepsis.  Fever, hypoxia, and hypotension. EXAM: CHEST - 2 VIEW COMPARISON:  Chest radiograph 02/27/2021 and CT 12/17/2021 FINDINGS: The cardiomediastinal silhouette is unchanged with normal heart size. Mild patchy and partly nodular opacities in the right mid and lower lung appear new from the prior radiograph although the interval chest CT demonstrated chronic findings which could account for some but likely not all of these densities. Left lung densities likely predominantly reflect scarring although a superimposed acute process is not excluded. No pleural effusion or pneumothorax is identified. A thoracic spinal cord stimulator and right upper quadrant abdominal surgical clips are noted. IMPRESSION: Bilateral lung opacities which are at least partially chronic (likely scarring related to history of granulomatosis with polyangiitis), however increased patchy opacities predominantly in the right mid to lower lung may reflect superimposed acute inflammation or infection. Electronically Signed   By: Logan Bores M.D.   On: 01/04/2022 15:41   CT CHEST WO CONTRAST  Result Date: 12/18/2021 CLINICAL DATA:  History of  granulomatosis with polyangiitis. Pulmonary cavitary vasculitis. EXAM: CT CHEST WITHOUT CONTRAST TECHNIQUE: Multidetector CT imaging of the chest was performed following the standard protocol without IV contrast. COMPARISON:  01/21/2021. FINDINGS: Cardiovascular: Atherosclerotic calcification of the aorta, aortic valve and coronary arteries. Left atrial appendage occlusion device. Heart is at the upper limits of normal in size. No pericardial effusion. Mediastinum/Nodes: No pathologically enlarged mediastinal or axillary lymph nodes. Hilar regions are difficult to definitively evaluate without IV contrast. Esophagus is grossly unremarkable. Lungs/Pleura: Centrilobular and paraseptal emphysema. Marked interval improvement in previously seen thick-walled cavitary lesions in the lungs bilaterally with residual areas of nodular consolidation, linear opacification and mild architectural distortion. No new pulmonary nodules. No pleural fluid. Airway is unremarkable. Upper Abdomen: Visualized portions of the liver, adrenal glands, kidneys, spleen, pancreas, stomach and bowel are grossly unremarkable. Cholecystectomy. Musculoskeletal: Spinal stimulator wires are in place. Mild degenerative changes in the spine. IMPRESSION: 1. Marked interval improvement in previously seen areas of thick-walled cavitation in the lungs bilaterally, now with residual areas of nodular consolidation, linear volume loss and mild architectural distortion, favoring postinflammatory scarring related to granulomatosis with polyangiitis. 2. Aortic atherosclerosis (ICD10-I70.0). Coronary artery calcification. 3.  Emphysema (ICD10-J43.9). Electronically Signed   By: Lorin Picket M.D.   On: 12/18/2021 10:46   CT Angio Chest Pulmonary Embolism (PE) W or WO Contrast  Result Date: 01/09/2022 CLINICAL DATA:  We discussed rise in D-dimer today and her high risk of blood clots given avoiding use of Lovenox with her history of severe epistaxis and fear  of bleeding and being sedentary in the hospital bed. She reassures me that she has been ambulating around the room quite well and has little concern that she has any blood clot right now. EXAM: CT ANGIOGRAPHY CHEST WITH CONTRAST TECHNIQUE: Multidetector CT imaging of the chest was performed using the standard protocol during bolus administration of intravenous contrast. Multiplanar CT image reconstructions and MIPs were obtained to evaluate the vascular anatomy. RADIATION DOSE REDUCTION: This exam was performed according to the departmental dose-optimization program which includes automated exposure control, adjustment of the mA and/or kV according to patient size and/or use of iterative reconstruction technique. CONTRAST:  28m OMNIPAQUE IOHEXOL 350 MG/ML SOLN COMPARISON:  01/05/2022. FINDINGS: Cardiovascular: There are bilateral pulmonary emboli.  There is a nonocclusive pulmonary embolus extending from the left lower lobe pulmonary artery into a posterior basal segmental branch, also straddling into the superior segmental branch. There are multiple additional bilateral segmental pulmonary emboli involving both upper lobes, both lower lobes and a single branch to the right middle lobe. Heart top-normal in size. No pericardial effusion. Great vessels are normal in caliber. Minor aortic atherosclerosis. No dissection. Mediastinum/Nodes: No neck base, mediastinal or hilar masses or enlarged lymph nodes. Trachea and esophagus are unremarkable. Lungs/Pleura: Bilateral multiple bilateral patchy lung opacities that are predominantly ground-glass. There are associated interstitial opacities, most prominent in the lower lobes. Peripheral nodule lies along the oblique fissure near the right lung base, centered on image 46, series 7, 1.2 cm. No pleural effusion.  No pneumothorax. Upper Abdomen: No acute abnormality. Musculoskeletal: No fracture or acute finding. No bone lesion. No chest wall mass. Review of the MIP images  confirms the above findings. IMPRESSION: 1. Multiple, bilateral, nonocclusive, predominantly segmental pulmonary emboli, new since the prior CT. No right heart strain. 2. Bilateral, predominantly ground-glass patchy airspace lung opacities. This has mildly increased in the right upper lobe since the prior study, but is otherwise unchanged. Lung opacities suspected to be due to multifocal pneumonia including atypical/viral etiologies. 3. 1.5 cm nodule, along the inferior right oblique fissure. This is similar to the chest CT from 01/21/2021. 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 No specific follow-up recommended given the overall stability. Aortic Atherosclerosis (ICD10-I70.0). Electronically Signed   By: Lajean Manes M.D.   On: 01/09/2022 16:21   CT Angio Chest PE W and/or Wo Contrast  Result Date: 01/05/2022 CLINICAL DATA:  Epigastric pain. Also concern for pulmonary embolism. EXAM: CT ANGIOGRAPHY CHEST CT ABDOMEN AND PELVIS WITH CONTRAST TECHNIQUE: Multidetector CT imaging of the chest was performed using the standard protocol during bolus administration of intravenous contrast. Multiplanar CT image reconstructions and MIPs were obtained to evaluate the vascular anatomy. Multidetector CT imaging of the abdomen and pelvis was performed using the standard protocol during bolus administration of intravenous contrast. CONTRAST:  150m OMNIPAQUE IOHEXOL 350 MG/ML SOLN COMPARISON:  CT abdomen pelvis dated 04/08/2008 and chest radiograph dated 01/04/2022. FINDINGS: Evaluation of this exam is limited due to respiratory motion artifact. CTA CHEST FINDINGS Cardiovascular: Borderline cardiomegaly. No pericardial effusion. Amplatzer occlusive device noted. Mild atherosclerotic calcification of the thoracic aorta. No aneurysmal dilatation. The origins of the great vessels of the aortic arch appear patent as visualized. Evaluation of the pulmonary arteries is very limited due to severe  respiratory motion artifact and suboptimal opacification and timing of the contrast. No definite pulmonary artery embolus identified. Mediastinum/Nodes: No hilar or mediastinal adenopathy. The esophagus is grossly unremarkable. No mediastinal fluid collection. Lungs/Pleura: Bilateral streaky densities and clusters of ground-glass density throughout the lungs most consistent with multifocal pneumonia, likely viral or atypical in etiology. Clinical correlation is recommended. Probable small pleural effusion along the fissures bilaterally. No pneumothorax. The central airways are patent. There is background of mild paraseptal emphysema. Musculoskeletal: No acute osseous pathology. Lower thoracic spinal stimulator. Review of the MIP images confirms the above findings. CT ABDOMEN and PELVIS FINDINGS No intra-abdominal free air or free fluid. Hepatobiliary: Fatty liver. No intrahepatic biliary dilatation. The gallbladder is surgically absent. Pancreas: Unremarkable. No pancreatic ductal dilatation or surrounding inflammatory changes. Spleen: Normal in size without focal abnormality. Adrenals/Urinary Tract: The adrenal glands unremarkable. Small right renal inferior pole cyst. There is no hydronephrosis on either side. There is symmetric enhancement  and excretion of contrast by both kidneys. The visualized ureters and urinary bladder appear unremarkable. Stomach/Bowel: There is no bowel obstruction or active inflammation. Appendectomy. Vascular/Lymphatic: Moderate aortoiliac atherosclerotic disease. The IVC is unremarkable. No portal venous gas. There is no adenopathy. Reproductive: The uterus is grossly unremarkable. No adnexal masses. Other: None Musculoskeletal: Degenerative changes of the spine and scoliosis. No acute osseous pathology. Review of the MIP images confirms the above findings. IMPRESSION: 1. No CT evidence of pulmonary embolism. 2. Multifocal pneumonia, likely viral or atypical in etiology. 3. No acute  intra-abdominal or pelvic pathology. 4. Fatty liver. 5. Aortic Atherosclerosis (ICD10-I70.0) and Emphysema (ICD10-J43.9). Electronically Signed   By: Anner Crete M.D.   On: 01/05/2022 03:23   CT ABDOMEN PELVIS W CONTRAST  Result Date: 01/05/2022 CLINICAL DATA:  Epigastric pain. Also concern for pulmonary embolism. EXAM: CT ANGIOGRAPHY CHEST CT ABDOMEN AND PELVIS WITH CONTRAST TECHNIQUE: Multidetector CT imaging of the chest was performed using the standard protocol during bolus administration of intravenous contrast. Multiplanar CT image reconstructions and MIPs were obtained to evaluate the vascular anatomy. Multidetector CT imaging of the abdomen and pelvis was performed using the standard protocol during bolus administration of intravenous contrast. CONTRAST:  158m OMNIPAQUE IOHEXOL 350 MG/ML SOLN COMPARISON:  CT abdomen pelvis dated 04/08/2008 and chest radiograph dated 01/04/2022. FINDINGS: Evaluation of this exam is limited due to respiratory motion artifact. CTA CHEST FINDINGS Cardiovascular: Borderline cardiomegaly. No pericardial effusion. Amplatzer occlusive device noted. Mild atherosclerotic calcification of the thoracic aorta. No aneurysmal dilatation. The origins of the great vessels of the aortic arch appear patent as visualized. Evaluation of the pulmonary arteries is very limited due to severe respiratory motion artifact and suboptimal opacification and timing of the contrast. No definite pulmonary artery embolus identified. Mediastinum/Nodes: No hilar or mediastinal adenopathy. The esophagus is grossly unremarkable. No mediastinal fluid collection. Lungs/Pleura: Bilateral streaky densities and clusters of ground-glass density throughout the lungs most consistent with multifocal pneumonia, likely viral or atypical in etiology. Clinical correlation is recommended. Probable small pleural effusion along the fissures bilaterally. No pneumothorax. The central airways are patent. There is  background of mild paraseptal emphysema. Musculoskeletal: No acute osseous pathology. Lower thoracic spinal stimulator. Review of the MIP images confirms the above findings. CT ABDOMEN and PELVIS FINDINGS No intra-abdominal free air or free fluid. Hepatobiliary: Fatty liver. No intrahepatic biliary dilatation. The gallbladder is surgically absent. Pancreas: Unremarkable. No pancreatic ductal dilatation or surrounding inflammatory changes. Spleen: Normal in size without focal abnormality. Adrenals/Urinary Tract: The adrenal glands unremarkable. Small right renal inferior pole cyst. There is no hydronephrosis on either side. There is symmetric enhancement and excretion of contrast by both kidneys. The visualized ureters and urinary bladder appear unremarkable. Stomach/Bowel: There is no bowel obstruction or active inflammation. Appendectomy. Vascular/Lymphatic: Moderate aortoiliac atherosclerotic disease. The IVC is unremarkable. No portal venous gas. There is no adenopathy. Reproductive: The uterus is grossly unremarkable. No adnexal masses. Other: None Musculoskeletal: Degenerative changes of the spine and scoliosis. No acute osseous pathology. Review of the MIP images confirms the above findings. IMPRESSION: 1. No CT evidence of pulmonary embolism. 2. Multifocal pneumonia, likely viral or atypical in etiology. 3. No acute intra-abdominal or pelvic pathology. 4. Fatty liver. 5. Aortic Atherosclerosis (ICD10-I70.0) and Emphysema (ICD10-J43.9). Electronically Signed   By: AAnner CreteM.D.   On: 01/05/2022 03:23   UKoreaVenous Img Lower Bilateral (DVT)  Result Date: 01/09/2022 CLINICAL DATA:  Positive D-dimer, COVID positive, edema EXAM: BILATERAL LOWER EXTREMITY VENOUS DOPPLER ULTRASOUND TECHNIQUE:  Gray-scale sonography with compression, as well as color and duplex ultrasound, were performed to evaluate the deep venous system(s) from the level of the common femoral vein through the popliteal and proximal calf  veins. COMPARISON:  None. FINDINGS: VENOUS On the right, normal compressibility of the common femoral, superficial femoral, and popliteal veins, as well as the visualized calf veins. Visualized portions of profunda femoral vein and great saphenous vein unremarkable. No filling defects to suggest DVT on grayscale or color Doppler imaging. Doppler waveforms show normal direction of venous flow, normal respiratory phasicity and response to augmentation. On the left, hypoechoic thrombus in a peroneal vein with resultant noncompressibility, and no flow signal on color Doppler. Normal compressibility of the common femoral, superficial femoral, and popliteal veins, as well as the visualized posterior tibial veins. OTHER None. Limitations: none IMPRESSION: 1. POSITIVE for isolated left peroneal (calf) DVT. 2. Negative for right lower extremity DVT. Electronically Signed   By: Lucrezia Europe M.D.   On: 01/09/2022 14:07    Microbiology: Results for orders placed or performed during the hospital encounter of 01/05/22  Culture, blood (Routine x 2)     Status: None   Collection Time: 01/04/22  2:44 PM   Specimen: BLOOD  Result Value Ref Range Status   Specimen Description BLOOD LEFT ANTECUBITAL  Final   Special Requests   Final    BOTTLES DRAWN AEROBIC AND ANAEROBIC Blood Culture adequate volume   Culture   Final    NO GROWTH 5 DAYS Performed at [facility], [address] [place], [place] [address]    Report Status 01/09/2022 FINAL  Final  Resp Panel by RT-PCR (Flu A&B, Covid) Nasopharyngeal Swab     Status: Abnormal   Collection Time: 01/04/22 10:03 PM   Specimen: Nasopharyngeal Swab; Nasopharyngeal(NP) swabs in vial transport medium  Result Value Ref Range Status   SARS Coronavirus 2 by RT PCR POSITIVE (A) NEGATIVE Final    Comment: (NOTE) SARS-CoV-2 target nucleic acids are DETECTED.  The SARS-CoV-2 RNA is generally detectable in upper respiratory specimens during the acute phase of  infection. Positive results are indicative of the presence of the identified virus, but do not rule out bacterial infection or co-infection with other pathogens not detected by the test. Clinical correlation with patient history and other diagnostic information is necessary to determine patient infection status. The expected result is Negative.  Fact Sheet for Patients: EntrepreneurPulse.com.au  Fact Sheet for Healthcare Providers: IncredibleEmployment.be  This test is not yet approved or cleared by the [place] FDA and  has been authorized for detection and/or diagnosis of SARS-CoV-2 by FDA under an Emergency Use Authorization (EUA).  This EUA will remain in effect (meaning this test can be used) for the duration of  the COVID-19 declaration under Section 564(b)(1) of the A ct, 21 U.S.C. section 360bbb-3(b)(1), unless the authorization is terminated or revoked sooner.     Influenza A by PCR NEGATIVE NEGATIVE Final   Influenza B by PCR NEGATIVE NEGATIVE Final    Comment: (NOTE) The Xpert Xpress SARS-CoV-2/FLU/RSV plus assay is intended as an aid in the diagnosis of influenza from Nasopharyngeal swab specimens and should not be used as a sole basis for treatment. Nasal washings and aspirates are unacceptable for Xpert Xpress SARS-CoV-2/FLU/RSV testing.  Fact Sheet for Patients: EntrepreneurPulse.com.au  Fact Sheet for Healthcare Providers: IncredibleEmployment.be  This test is not yet approved or cleared by the [place] FDA and has been authorized for detection and/or diagnosis of SARS-CoV-2 by  FDA under an Emergency Use Authorization (EUA). This EUA will remain in effect (meaning this test can be used) for the duration of the COVID-19 declaration under Section 564(b)(1) of the Act, 21 U.S.C. section 360bbb-3(b)(1), unless the authorization is terminated or revoked.  Performed at [facility], [address] [place], [place] [address]   Culture, blood (Routine X 2) w Reflex to ID Panel     Status: None   Collection Time: 01/05/22  7:04 AM   Specimen: BLOOD  Result Value Ref Range Status   Specimen Description BLOOD [facility]  Final   Special Requests   Final    BOTTLES DRAWN AEROBIC AND ANAEROBIC Blood Culture adequate volume   Culture   Final    NO GROWTH 5 DAYS Performed at [facility], [address] [place], [place] [address]    Report Status 01/10/2022 FINAL  Final    Labs: CBC: Recent Labs  Lab 01/06/22 0501 01/07/22 0444 01/08/22 0503 01/09/22 3335 01/10/22 0554 01/11/22 0644 01/12/22 0502  WBC 4.0 8.9 6.3 6.7 6.3 7.3 7.4  NEUTROABS 3.0 7.7 5.6 5.6 4.6  --   --   HGB 11.3* 11.0* 10.2* 10.3* 10.1* 9.8* 10.3*  HCT 33.9* 32.8* 31.2* 30.7* 30.1* 30.1* 32.4*  MCV 82.7 84.1 84.1 84.1 83.4 85.0 86.4  PLT 287 317 320 302 269 261 456   Basic Metabolic Panel: Recent Labs  Lab 01/07/22 0444 01/08/22 0503 01/09/22 0607 01/10/22 0554 01/11/22 0644 01/12/22 0502  NA 144 140 143 144 143 144  K 3.4* 3.0* 2.8* 2.6* 3.5 4.5  CL 112* 107 103 101 103 103  CO2 29 27 29  33* 33* 33*  GLUCOSE 158* 118* 90 94 90 92  BUN 19 18 16 11 15 11   CREATININE 0.63 0.68 0.61 0.65 0.54 0.65  CALCIUM 8.8* 8.3* 8.3* 8.0* 8.2* 8.9  MG 2.0  --  1.9 2.0  --   --   PHOS 2.2* 2.8 2.4* 3.6 3.3  --    Liver Function Tests: Recent Labs  Lab 01/06/22 0501 01/07/22 0444 01/08/22 0503 01/09/22 0607 01/10/22 0554  AST 29 27 28 24 25   ALT 23 20 21 22 25   ALKPHOS 47 48 44 48 47  BILITOT 0.3 0.4 0.5 0.4 0.5  PROT 5.9* 5.5* 5.7* 5.5* 5.3*  ALBUMIN 2.8* 2.6* 2.7* 2.6* 2.3*   CBG: No results for input(s): GLUCAP in the last 168 hours.  Discharge time spent: greater than 30 minutes.  Signed: Ezekiel Slocumb, DO [facility] 01/12/2022

## 2022-01-12 NOTE — Progress Notes (Signed)
\  SATURATION QUALIFICATIONS: (This note is used to comply with regulatory documentation for home oxygen)  Patient Saturations on Room Air at Rest = 93%  Patient Saturations on Room Air while Ambulating = 84%  Patient Saturations on 0 Liters of oxygen while Ambulating = 84-90%  Please briefly explain why patient needs home oxygen: Patient while ambulating in room had O2 saturation as low as 84%.  Once at rest lying in bed patient requires 4L O2 to adequately recover and maintain O2 sat of above 90%.

## 2022-01-19 ENCOUNTER — Telehealth: Payer: Self-pay

## 2022-01-19 NOTE — Telephone Encounter (Signed)
LM for patient to call office for pre virtual visit appointment.

## 2022-01-20 ENCOUNTER — Encounter: Payer: Self-pay | Admitting: Student in an Organized Health Care Education/Training Program

## 2022-01-20 ENCOUNTER — Other Ambulatory Visit: Payer: Self-pay

## 2022-01-20 ENCOUNTER — Ambulatory Visit
Payer: Medicare Other | Attending: Student in an Organized Health Care Education/Training Program | Admitting: Student in an Organized Health Care Education/Training Program

## 2022-01-20 DIAGNOSIS — M7918 Myalgia, other site: Secondary | ICD-10-CM

## 2022-01-20 DIAGNOSIS — G8929 Other chronic pain: Secondary | ICD-10-CM

## 2022-01-20 DIAGNOSIS — M533 Sacrococcygeal disorders, not elsewhere classified: Secondary | ICD-10-CM

## 2022-01-20 DIAGNOSIS — M47818 Spondylosis without myelopathy or radiculopathy, sacral and sacrococcygeal region: Secondary | ICD-10-CM | POA: Diagnosis not present

## 2022-01-20 DIAGNOSIS — G894 Chronic pain syndrome: Secondary | ICD-10-CM

## 2022-01-20 NOTE — Progress Notes (Signed)
Patient: Cassandra Brown  Service Category: E/M  Provider: Gillis Santa, MD  DOB: Aug 04, 1966  DOS: 01/20/2022  Location: Office  MRN: [id number]  Setting: Ambulatory outpatient  Referring Provider: Baxter Hire, MD  Type: Established Patient  Specialty: Interventional Pain Management  PCP: Baxter Hire, MD  Location: Remote location  Delivery: TeleHealth     Virtual Encounter - Pain Management PROVIDER NOTE: Information contained herein reflects review and annotations entered in association with encounter. Interpretation of such information and data should be left to medically-trained personnel. Information provided to patient can be located elsewhere in the medical record under "Patient Instructions". Document created using STT-dictation technology, any transcriptional errors that may result from process are unintentional.    Contact & Pharmacy Preferred: [phone] Home: [phone] (home) Mobile: [phone] (mobile) E-mail: [email or web address] .[place], [place] [address] [place] [address] Phone: [phone] Fax: [phone]   Pre-screening  Cassandra Brown offered "in-person" vs "virtual" encounter. She indicated preferring virtual for this encounter.   Reason COVID-19*   Social distancing based on CDC and AMA recommendations.   I contacted Cassandra Brown on 01/20/2022 via telephone.      I clearly identified myself as Gillis Santa, MD. I verified that I was speaking with the correct person using two identifiers (Name: Cassandra Brown, and date of birth: 01/24/1966).  Consent I sought verbal advanced consent from Cassandra Brown for virtual visit interactions. I informed Cassandra Brown of possible security and privacy concerns, risks, and limitations associated with providing "not-in-person" medical evaluation and management services. I also informed Cassandra Brown of the availability of "in-person" appointments.  Finally, I informed her that there would be a charge for the virtual visit and that she could be  personally, fully or partially, financially responsible for it. Cassandra Brown expressed understanding and agreed to proceed.   Historic Elements   Cassandra Brown is a 56 y.o. year old, female patient evaluated today after our last contact on 11/25/2021. Cassandra Brown  has a past medical history of Allergic rhinitis due to pollen, Angina pectoris ([place]), CHF (congestive heart failure) ([place]), Chronic maxillary sinusitis, Chronic pain syndrome, Chronic right sacroiliac joint pain, Collagen vascular disease ([place]), Coronary artery abnormality, Dysrhythmia, Gastroparesis, GERD (gastroesophageal reflux disease), History of kidney stones, Leukocytosis, Obesity, Patent foramen ovale, Rheumatoid arthritis ([place]), Sleep apnea, Spinal stenosis of lumbar region, Unequal leg length, Vasculitis ([place]), and Wegener's disease, pulmonary. She also  has a past surgical history that includes Joint replacement (Right); Knee surgery (Left); Shoulder surgery (Bilateral); Appendectomy; Cholecystectomy; Thoracic laminectomy for spinal cord stimulator (Bilateral, 06/23/2020); Pulse generator implant (Left, 06/23/2020); Video bronchoscopy with endobronchial ultrasound (N/A, 02/27/2021); Video bronchoscopy with endobronchial navigation (N/A, 02/27/2021); and Nasal hemorrhage control (N/A, 05/13/2021). Cassandra Brown has a current medication list which includes the following prescription(s): advair hfa, albuterol, apixaban, aspirin ec, bac, carisoprodol, chlorpheniramine-hydrocodone, cholecalciferol, clobetasol cream, dupixent, emgality, epinephrine, feeding supplement, fluticasone, folic acid, furosemide, gentamicin ointment, guaifenesin, hydrocodone-acetaminophen, loratadine, methotrexate, montelukast, naloxone, nurtec, pantoprazole, pentoxifylline, pimecrolimus, pregabalin, promethazine, propranolol er, rosuvastatin, sodium chloride, sotalol, centrum  performance, spiriva respimat, sucralfate, and triamcinolone cream. She  reports that she quit smoking about 10 years ago. Her smoking use included cigarettes. She has a 99.00 pack-year smoking history. She has never used smokeless tobacco. She reports that she does not currently use alcohol. She reports that she does not use drugs. Cassandra Brown is allergic to cyclobenzaprine, ibuprofen, morphine, meperidine, sulfa antibiotics, and onion.  HPI  Today, she is being contacted for a post-procedure assessment.   Post-procedure evaluation     Procedure:          Anesthesia, Analgesia, Anxiolysis:  Type: LEFT Therapeutic Sacroiliac Joint Steroid Injection #3 & LEFT Piriformis TPI #3 (#1 done 09/09/21, #2 10/21/2021) Region: Inferior Lumbosacral Region Level: PIIS (Posterior Inferior Iliac Spine) Laterality: Left  Type: Local Anesthesia Local Anesthetic: Lidocaine 1-2%   Position: Prone           11/25/21  Indications: 1. Chronic sacroiliac joint pain   2. SI joint arthritis   3. Piriformis muscle pain   4. Chronic pain syndrome    Pain Score: Pre-procedure: 6 /10 Post-procedure: 5  (walking and moving)/10      Effectiveness:  Initial hour after procedure: 100 %  Subsequent 4-6 hours post-procedure: 100 %  Analgesia past initial 6 hours: 70 % (lasting 1.5 months)  Ongoing improvement:  Analgesic:  60-70% Function: Back to baseline ROM: Back to baseline   Laboratory Chemistry Profile   Renal Lab Results  Component Value Date   BUN 11 01/12/2022   CREATININE 0.65 01/12/2022   GFRAA >60 06/19/2020   GFRNONAA >60 01/12/2022    Hepatic Lab Results  Component Value Date   AST 25 01/10/2022   ALT 25 01/10/2022   ALBUMIN 2.3 (L) 01/10/2022   ALKPHOS 47 01/10/2022   LIPASE 58 (H) 01/04/2022    Electrolytes Lab Results  Component Value Date   NA 144 01/12/2022   K 4.5 01/12/2022   CL 103 01/12/2022   CALCIUM 8.9 01/12/2022   MG 2.0 01/10/2022   PHOS 3.3 01/11/2022     Bone No results found for: VD25OH, VD125OH2TOT, OR5615PP9, KF2761YJ0, 25OHVITD1, 25OHVITD2, 25OHVITD3, TESTOFREE, TESTOSTERONE  Inflammation (CRP: Acute Phase) (ESR: Chronic Phase) Lab Results  Component Value Date   CRP 6.2 (H) 01/12/2022   LATICACIDVEN 1.3 01/05/2022         Note: Above Lab results reviewed.  Imaging  CT Angio Chest Pulmonary Embolism (PE) W or WO Contrast CLINICAL DATA:  We discussed rise in D-dimer today and her high risk of blood clots given avoiding use of Lovenox with her history of severe epistaxis and fear of bleeding and being sedentary in the hospital bed. She reassures me that she has been ambulating around the room quite well and has little concern that she has any blood clot right now.  EXAM: CT ANGIOGRAPHY CHEST WITH CONTRAST  TECHNIQUE: Multidetector CT imaging of the chest was performed using the standard protocol during bolus administration of intravenous contrast. Multiplanar CT image reconstructions and MIPs were obtained to evaluate the vascular anatomy.  RADIATION DOSE REDUCTION: This exam was performed according to the departmental dose-optimization program which includes automated exposure control, adjustment of the mA and/or kV according to patient size and/or use of iterative reconstruction technique.  CONTRAST:  39m OMNIPAQUE IOHEXOL 350 MG/ML SOLN  COMPARISON:  01/05/2022.  FINDINGS: Cardiovascular: There are bilateral pulmonary emboli. There is a nonocclusive pulmonary embolus extending from the left lower lobe pulmonary artery into a posterior basal segmental branch, also straddling into the superior segmental branch. There are multiple additional bilateral segmental pulmonary emboli involving both upper lobes, both lower lobes and a single branch to the right middle lobe.  Heart top-normal in size. No pericardial effusion. Great vessels are normal in caliber. Minor aortic atherosclerosis. No  dissection.  Mediastinum/Nodes: No neck base, mediastinal or hilar masses or enlarged lymph nodes. Trachea and esophagus are unremarkable.  Lungs/Pleura: Bilateral multiple bilateral patchy lung opacities that are predominantly ground-glass. There are associated interstitial opacities, most prominent in the lower lobes. Peripheral nodule lies along the oblique fissure near the right lung base, centered on image 46, series 7, 1.2 cm.  No pleural effusion.  No pneumothorax.  Upper Abdomen: No acute abnormality.  Musculoskeletal: No fracture or acute finding. No bone lesion. No chest wall mass.  Review of the MIP images confirms the above findings.  IMPRESSION: 1. Multiple, bilateral, nonocclusive, predominantly segmental pulmonary emboli, new since the prior CT. No right heart strain. 2. Bilateral, predominantly ground-glass patchy airspace lung opacities. This has mildly increased in the right upper lobe since the prior study, but is otherwise unchanged. Lung opacities suspected to be due to multifocal pneumonia including atypical/viral etiologies. 3. 1.5 cm nodule, along the inferior right oblique fissure. This is similar to the chest CT from 01/21/2021. 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 No specific follow-up recommended given the overall stability.  Aortic Atherosclerosis (ICD10-I70.0).  Electronically Signed   By: Lajean Manes M.D.   On: 01/09/2022 16:21 US Venous Img Lower Bilateral (DVT) CLINICAL DATA:  Positive D-dimer, COVID positive, edema  EXAM: BILATERAL LOWER EXTREMITY VENOUS DOPPLER ULTRASOUND  TECHNIQUE: Gray-scale sonography with compression, as well as color and duplex ultrasound, were performed to evaluate the deep venous system(s) from the level of the common femoral vein through the popliteal and proximal calf veins.  COMPARISON:  None.  FINDINGS: VENOUS  On the right, normal compressibility of the common  femoral, superficial femoral, and popliteal veins, as well as the visualized calf veins. Visualized portions of profunda femoral vein and great saphenous vein unremarkable. No filling defects to suggest DVT on grayscale or color Doppler imaging. Doppler waveforms show normal direction of venous flow, normal respiratory phasicity and response to augmentation.  On the left, hypoechoic thrombus in a peroneal vein with resultant noncompressibility, and no flow signal on color Doppler. Normal compressibility of the common femoral, superficial femoral, and popliteal veins, as well as the visualized posterior tibial veins.  OTHER  None.  Limitations: none  IMPRESSION: 1. POSITIVE for isolated left peroneal (calf) DVT. 2. Negative for right lower extremity DVT.  Electronically Signed   By: Lucrezia Europe M.D.   On: 01/09/2022 14:07  Assessment  The primary encounter diagnosis was Chronic sacroiliac joint pain. Diagnoses of SI joint arthritis, Piriformis muscle pain, and Chronic pain syndrome were also pertinent to this visit.  Plan of Care   Patient was doing well after her previous left SI joint and left piriformis injection performed at the end of November.  Unfortunately she became ill in December with COVID and was admitted to the hospital for over 2 weeks.  She feels that being sedentary at the hospital really set her back and now she is having return of her left SI joint and left piriformis pain.  We discussed repeating her left SI joint and piriformis injection which she obtained 90% pain relief for the first 4 weeks which reduced to 70% for the remaining 2 weeks.  Orders:  Orders Placed This Encounter  Procedures   SACROILIAC JOINT INJECTION    Standing Status:   Future    Standing Expiration Date:   02/20/2022    Scheduling Instructions:     Left SI joint injection without sedation    Order Specific Question:   Where will this procedure be performed?    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 CPT code: 20552    Standing Status:   Future    Standing Expiration Date:   01/20/2023    Scheduling Instructions:     Type: Myoneural block (TPI) of piriformis muscle.     Left piriformis TPI    Order Specific Question:   Where will this procedure be performed?    Answer:   [facility]   Follow-up plan:   Return in about 26 days (around 02/15/2022) for Left SIJ, left piriformis TPI, without sedation.     Left L4/5 ESI #1 07/13/21, left piriformis, left sacroiliac joint injection 09/09/2021, 10/21/21, 11/25/21    Recent Visits Date Type Provider Dept  11/25/21 Procedure visit Gillis Santa, MD [facility]  11/17/21 Office Visit Gillis Santa, MD [facility]  Showing recent visits within past 90 days and meeting all other requirements Today's Visits Date Type Provider Dept  01/20/22 Office Visit Gillis Santa, MD [facility]  Showing today's visits and meeting all other requirements Future Appointments No visits were found meeting these conditions. Showing future appointments within next 90 days and meeting all other requirements  I discussed the assessment and treatment plan with the patient. The patient was provided an opportunity to ask questions and all were answered. The patient agreed with the plan and demonstrated an understanding of the instructions.  Patient advised to call back or seek an in-person evaluation if the symptoms or condition worsens.  Duration of encounter: 20mnutes.  Note by: BGillis Santa MD Date: 01/20/2022; Time: 1:27 PM

## 2022-01-20 NOTE — Progress Notes (Deleted)
Patient: Cassandra Brown  Service Category: E/M  Provider: Gillis Santa, MD  DOB: Aug 04, 1966  DOS: 01/20/2022  Location: Office  MRN: [id number]  Setting: Ambulatory outpatient  Referring Provider: Baxter Hire, MD  Type: Established Patient  Specialty: Interventional Pain Management  PCP: Baxter Hire, MD  Location: Remote location  Delivery: TeleHealth     Virtual Encounter - Pain Management PROVIDER NOTE: Information contained herein reflects review and annotations entered in association with encounter. Interpretation of such information and data should be left to medically-trained personnel. Information provided to patient can be located elsewhere in the medical record under "Patient Instructions". Document created using STT-dictation technology, any transcriptional errors that may result from process are unintentional.    Contact & Pharmacy Preferred: [phone] Home: [phone] (home) Mobile: [phone] (mobile) E-mail: [email or web address] .[place], [place] - [address] [place] [address] Phone: [phone] Fax: [phone]   Pre-screening  Cassandra Brown offered "in-person" vs "virtual" encounter. She indicated preferring virtual for this encounter.   Reason COVID-19*   Social distancing based on CDC and AMA recommendations.   I contacted Cassandra Brown on 01/20/2022 via telephone.      I clearly identified myself as Gillis Santa, MD. I verified that I was speaking with the correct person using two identifiers (Name: Cassandra Brown, and date of birth: 01/24/1966).  Consent I sought verbal advanced consent from Cassandra Brown for virtual visit interactions. I informed Cassandra Brown of possible security and privacy concerns, risks, and limitations associated with providing "not-in-person" medical evaluation and management services. I also informed Cassandra Brown of the availability of "in-person" appointments.  Finally, I informed her that there would be a charge for the virtual visit and that she could be  personally, fully or partially, financially responsible for it. Cassandra Brown expressed understanding and agreed to proceed.   Historic Elements   Cassandra Brown is a 56 y.o. year old, female patient evaluated today after our last contact on 11/25/2021. Cassandra Brown  has a past medical history of Allergic rhinitis due to pollen, Angina pectoris ([place]), CHF (congestive heart failure) ([place]), Chronic maxillary sinusitis, Chronic pain syndrome, Chronic right sacroiliac joint pain, Collagen vascular disease ([place]), Coronary artery abnormality, Dysrhythmia, Gastroparesis, GERD (gastroesophageal reflux disease), History of kidney stones, Leukocytosis, Obesity, Patent foramen ovale, Rheumatoid arthritis ([place]), Sleep apnea, Spinal stenosis of lumbar region, Unequal leg length, Vasculitis ([place]), and Wegener's disease, pulmonary. She also  has a past surgical history that includes Joint replacement (Right); Knee surgery (Left); Shoulder surgery (Bilateral); Appendectomy; Cholecystectomy; Thoracic laminectomy for spinal cord stimulator (Bilateral, 06/23/2020); Pulse generator implant (Left, 06/23/2020); Video bronchoscopy with endobronchial ultrasound (N/A, 02/27/2021); Video bronchoscopy with endobronchial navigation (N/A, 02/27/2021); and Nasal hemorrhage control (N/A, 05/13/2021). Cassandra Brown has a current medication list which includes the following prescription(s): advair hfa, albuterol, apixaban, aspirin ec, bac, carisoprodol, chlorpheniramine-hydrocodone, cholecalciferol, clobetasol cream, dupixent, emgality, epinephrine, feeding supplement, fluticasone, folic acid, furosemide, gentamicin ointment, guaifenesin, hydrocodone-acetaminophen, loratadine, methotrexate, montelukast, naloxone, nurtec, pantoprazole, pentoxifylline, pimecrolimus, pregabalin, promethazine, propranolol er, rosuvastatin, sodium chloride, sotalol, centrum  performance, spiriva respimat, sucralfate, and triamcinolone cream. She  reports that she quit smoking about 10 years ago. Her smoking use included cigarettes. She has a 99.00 pack-year smoking history. She has never used smokeless tobacco. She reports that she does not currently use alcohol. She reports that she does not use drugs. Cassandra Brown is allergic to cyclobenzaprine, ibuprofen, morphine, meperidine, sulfa antibiotics, and onion.  HPI  Today, she is being contacted for a post-procedure assessment.   Post-procedure evaluation     Procedure:          Anesthesia, Analgesia, Anxiolysis:  Type: LEFT Therapeutic Sacroiliac Joint Steroid Injection #3 & LEFT Piriformis TPI #3 (#1 done 09/09/21, #2 10/21/2021) Region: Inferior Lumbosacral Region Level: PIIS (Posterior Inferior Iliac Spine) Laterality: Left  Type: Local Anesthesia Local Anesthetic: Lidocaine 1-2%   Position: Prone           Indications: 1. Chronic sacroiliac joint pain   2. SI joint arthritis   3. Piriformis muscle pain   4. Chronic pain syndrome    Pain Score: Pre-procedure: 6 /10 Post-procedure: 5  (walking and moving)/10      Effectiveness:  Initial hour after procedure: 100 %  Subsequent 4-6 hours post-procedure: 100 %  Analgesia past initial 6 hours: 70 % (lasting 1.5 months)  Ongoing improvement:  Analgesic:  60-70% Function:  ***  ROM:  ***    Laboratory Chemistry Profile   Renal Lab Results  Component Value Date   BUN 11 01/12/2022   CREATININE 0.65 01/12/2022   GFRAA >60 06/19/2020   GFRNONAA >60 01/12/2022    Hepatic Lab Results  Component Value Date   AST 25 01/10/2022   ALT 25 01/10/2022   ALBUMIN 2.3 (L) 01/10/2022   ALKPHOS 47 01/10/2022   LIPASE 58 (H) 01/04/2022    Electrolytes Lab Results  Component Value Date   NA 144 01/12/2022   K 4.5 01/12/2022   CL 103 01/12/2022   CALCIUM 8.9 01/12/2022   MG 2.0 01/10/2022   PHOS 3.3 01/11/2022    Bone No results found for:  VD25OH, VD125OH2TOT, TK1601UX3, AT5573UK0, 25OHVITD1, 25OHVITD2, 25OHVITD3, TESTOFREE, TESTOSTERONE  Inflammation (CRP: Acute Phase) (ESR: Chronic Phase) Lab Results  Component Value Date   CRP 6.2 (H) 01/12/2022   LATICACIDVEN 1.3 01/05/2022         Note: Above Lab results reviewed.  Imaging  CT Angio Chest Pulmonary Embolism (PE) W or WO Contrast CLINICAL DATA:  We discussed rise in D-dimer today and her high risk of blood clots given avoiding use of Lovenox with her history of severe epistaxis and fear of bleeding and being sedentary in the hospital bed. She reassures me that she has been ambulating around the room quite well and has little concern that she has any blood clot right now.  EXAM: CT ANGIOGRAPHY CHEST WITH CONTRAST  TECHNIQUE: Multidetector CT imaging of the chest was performed using the standard protocol during bolus administration of intravenous contrast. Multiplanar CT image reconstructions and MIPs were obtained to evaluate the vascular anatomy.  RADIATION DOSE REDUCTION: This exam was performed according to the departmental dose-optimization program which includes automated exposure control, adjustment of the mA and/or kV according to patient size and/or use of iterative reconstruction technique.  CONTRAST:  16m OMNIPAQUE IOHEXOL 350 MG/ML SOLN  COMPARISON:  01/05/2022.  FINDINGS: Cardiovascular: There are bilateral pulmonary emboli. There is a nonocclusive pulmonary embolus extending from the left lower lobe pulmonary artery into a posterior basal segmental branch, also straddling into the superior segmental branch. There are multiple additional bilateral segmental pulmonary emboli involving both upper lobes, both lower lobes and a single branch to the right middle lobe.  Heart top-normal in size. No pericardial effusion. Great vessels are normal in caliber. Minor aortic atherosclerosis. No dissection.  Mediastinum/Nodes: No neck base,  mediastinal or hilar masses or enlarged lymph nodes. Trachea and esophagus are unremarkable.  Lungs/Pleura: Bilateral  multiple bilateral patchy lung opacities that are predominantly ground-glass. There are associated interstitial opacities, most prominent in the lower lobes. Peripheral nodule lies along the oblique fissure near the right lung base, centered on image 46, series 7, 1.2 cm.  No pleural effusion.  No pneumothorax.  Upper Abdomen: No acute abnormality.  Musculoskeletal: No fracture or acute finding. No bone lesion. No chest wall mass.  Review of the MIP images confirms the above findings.  IMPRESSION: 1. Multiple, bilateral, nonocclusive, predominantly segmental pulmonary emboli, new since the prior CT. No right heart strain. 2. Bilateral, predominantly ground-glass patchy airspace lung opacities. This has mildly increased in the right upper lobe since the prior study, but is otherwise unchanged. Lung opacities suspected to be due to multifocal pneumonia including atypical/viral etiologies. 3. 1.5 cm nodule, along the inferior right oblique fissure. This is similar to the chest CT from 01/21/2021. 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 No specific follow-up recommended given the overall stability.  Aortic Atherosclerosis (ICD10-I70.0).  Electronically Signed   By: Lajean Manes M.D.   On: 01/09/2022 16:21 US Venous Img Lower Bilateral (DVT) CLINICAL DATA:  Positive D-dimer, COVID positive, edema  EXAM: BILATERAL LOWER EXTREMITY VENOUS DOPPLER ULTRASOUND  TECHNIQUE: Gray-scale sonography with compression, as well as color and duplex ultrasound, were performed to evaluate the deep venous system(s) from the level of the common femoral vein through the popliteal and proximal calf veins.  COMPARISON:  None.  FINDINGS: VENOUS  On the right, normal compressibility of the common femoral, superficial femoral, and popliteal veins,  as well as the visualized calf veins. Visualized portions of profunda femoral vein and great saphenous vein unremarkable. No filling defects to suggest DVT on grayscale or color Doppler imaging. Doppler waveforms show normal direction of venous flow, normal respiratory phasicity and response to augmentation.  On the left, hypoechoic thrombus in a peroneal vein with resultant noncompressibility, and no flow signal on color Doppler. Normal compressibility of the common femoral, superficial femoral, and popliteal veins, as well as the visualized posterior tibial veins.  OTHER  None.  Limitations: none  IMPRESSION: 1. POSITIVE for isolated left peroneal (calf) DVT. 2. Negative for right lower extremity DVT.  Electronically Signed   By: Lucrezia Europe M.D.   On: 01/09/2022 14:07  Assessment  There were no encounter diagnoses.  Plan of Care  Problem-specific:  No problem-specific Assessment & Plan notes found for this encounter.  Cassandra Brown has a current medication list which includes the following long-term medication(s): advair hfa, albuterol, apixaban, fluticasone, furosemide, loratadine, montelukast, pantoprazole, pregabalin, promethazine, propranolol er, rosuvastatin, sodium chloride, sotalol, spiriva respimat, and sucralfate.  Pharmacotherapy (Medications Ordered): No orders of the defined types were placed in this encounter.  Orders:  No orders of the defined types were placed in this encounter.  Follow-up plan:   No follow-ups on file.     Left L4/5 ESI #1 07/13/21, left piriformis, left sacroiliac joint injection 09/09/2021, 10/21/21, 11/25/21      Recent Visits Date Type Provider Dept  11/25/21 Procedure visit Gillis Santa, MD [facility]  11/17/21 Office Visit Gillis Santa, MD [facility]  Showing recent visits within past 90 days and meeting all other requirements Today's Visits Date Type Provider Dept  01/20/22 Office Visit  Gillis Santa, MD [facility]  Showing today's visits and meeting all other requirements Future Appointments No visits were found meeting these conditions. Showing future appointments within next 90 days and meeting all other  requirements  I discussed the assessment and treatment plan with the patient. The patient was provided an opportunity to ask questions and all were answered. The patient agreed with the plan and demonstrated an understanding of the instructions.  Patient advised to call back or seek an in-person evaluation if the symptoms or condition worsens.  Duration of encounter: *** minutes.  Note by: Gillis Santa, MD Date: 01/20/2022; Time: 12:54 PM

## 2022-01-21 NOTE — Progress Notes (Signed)
Spoke with patient re; orders for VV.  She was ordered for a left SIJ injection and left piriformis TPI, no sedation.  She is currently on eliquis for blood clots and just recently released from hospital.  She would like to wait until she is feeling better and hopefully off of the eliquis.  I have notified Blanch Media of this situation.

## 2022-03-01 ENCOUNTER — Other Ambulatory Visit (HOSPITAL_COMMUNITY): Payer: Self-pay | Admitting: Student

## 2022-03-01 ENCOUNTER — Other Ambulatory Visit: Payer: Self-pay | Admitting: Student

## 2022-03-01 DIAGNOSIS — U099 Post covid-19 condition, unspecified: Secondary | ICD-10-CM

## 2022-03-08 ENCOUNTER — Other Ambulatory Visit
Admission: RE | Admit: 2022-03-08 | Discharge: 2022-03-08 | Disposition: A | Discharge status: Home / Self Care | Payer: Medicare Other | Source: Ambulatory Visit | Attending: Physician Assistant | Admitting: Physician Assistant

## 2022-03-08 DIAGNOSIS — I509 Heart failure, unspecified: Secondary | ICD-10-CM | POA: Diagnosis present

## 2022-03-08 DIAGNOSIS — I429 Cardiomyopathy, unspecified: Secondary | ICD-10-CM | POA: Insufficient documentation

## 2022-03-08 DIAGNOSIS — Z79899 Other long term (current) drug therapy: Secondary | ICD-10-CM | POA: Insufficient documentation

## 2022-03-08 LAB — BRAIN NATRIURETIC PEPTIDE: B Natriuretic Peptide: 56.7 pg/mL (ref 0.0–100.0)

## 2022-03-12 ENCOUNTER — Other Ambulatory Visit: Payer: Self-pay

## 2022-03-12 ENCOUNTER — Ambulatory Visit
Admission: RE | Admit: 2022-03-12 | Discharge: 2022-03-12 | Disposition: A | Discharge status: Home / Self Care | Payer: Medicare Other | Source: Ambulatory Visit | Attending: Student | Admitting: Student

## 2022-03-12 DIAGNOSIS — U099 Post covid-19 condition, unspecified: Secondary | ICD-10-CM | POA: Insufficient documentation

## 2022-04-29 ENCOUNTER — Encounter: Payer: Self-pay | Admitting: Gastroenterology

## 2022-04-30 ENCOUNTER — Ambulatory Visit: Payer: Medicare Other | Admitting: Certified Registered"

## 2022-04-30 ENCOUNTER — Encounter
Admission: RE | Disposition: A | Discharge status: Home / Self Care | Payer: Self-pay | Source: Home / Self Care | Attending: Gastroenterology

## 2022-04-30 ENCOUNTER — Encounter: Payer: Self-pay | Admitting: Gastroenterology

## 2022-04-30 ENCOUNTER — Ambulatory Visit
Admission: RE | Admit: 2022-04-30 | Discharge: 2022-04-30 | Disposition: A | Discharge status: Home / Self Care | Payer: Medicare Other | Attending: Gastroenterology | Admitting: Gastroenterology

## 2022-04-30 DIAGNOSIS — K2970 Gastritis, unspecified, without bleeding: Secondary | ICD-10-CM | POA: Insufficient documentation

## 2022-04-30 DIAGNOSIS — K2289 Other specified disease of esophagus: Secondary | ICD-10-CM | POA: Insufficient documentation

## 2022-04-30 DIAGNOSIS — K644 Residual hemorrhoidal skin tags: Secondary | ICD-10-CM | POA: Insufficient documentation

## 2022-04-30 DIAGNOSIS — K2100 Gastro-esophageal reflux disease with esophagitis, without bleeding: Secondary | ICD-10-CM | POA: Insufficient documentation

## 2022-04-30 DIAGNOSIS — D122 Benign neoplasm of ascending colon: Secondary | ICD-10-CM | POA: Insufficient documentation

## 2022-04-30 DIAGNOSIS — G4730 Sleep apnea, unspecified: Secondary | ICD-10-CM | POA: Insufficient documentation

## 2022-04-30 DIAGNOSIS — I4891 Unspecified atrial fibrillation: Secondary | ICD-10-CM | POA: Diagnosis not present

## 2022-04-30 DIAGNOSIS — Z7951 Long term (current) use of inhaled steroids: Secondary | ICD-10-CM | POA: Diagnosis not present

## 2022-04-30 DIAGNOSIS — Z79899 Other long term (current) drug therapy: Secondary | ICD-10-CM | POA: Diagnosis not present

## 2022-04-30 DIAGNOSIS — K648 Other hemorrhoids: Secondary | ICD-10-CM | POA: Insufficient documentation

## 2022-04-30 DIAGNOSIS — Z87891 Personal history of nicotine dependence: Secondary | ICD-10-CM | POA: Diagnosis not present

## 2022-04-30 DIAGNOSIS — Z86010 Personal history of colonic polyps: Secondary | ICD-10-CM | POA: Insufficient documentation

## 2022-04-30 DIAGNOSIS — I509 Heart failure, unspecified: Secondary | ICD-10-CM | POA: Diagnosis not present

## 2022-04-30 DIAGNOSIS — D123 Benign neoplasm of transverse colon: Secondary | ICD-10-CM | POA: Diagnosis not present

## 2022-04-30 HISTORY — DX: Personal history of other diseases of the digestive system: Z87.19

## 2022-04-30 HISTORY — PX: ESOPHAGOGASTRODUODENOSCOPY: SHX5428

## 2022-04-30 HISTORY — DX: Leakage of unspecified cardiac and vascular devices and implants, initial encounter: T82.539A

## 2022-04-30 HISTORY — DX: Unspecified atrial fibrillation: I48.91

## 2022-04-30 HISTORY — PX: COLONOSCOPY: SHX5424

## 2022-04-30 SURGERY — COLONOSCOPY
Anesthesia: General

## 2022-04-30 MED ORDER — LIDOCAINE HCL (CARDIAC) PF 100 MG/5ML IV SOSY
PREFILLED_SYRINGE | INTRAVENOUS | Status: DC | PRN
  Administered 2022-04-30: 100 mg via INTRAVENOUS

## 2022-04-30 MED ORDER — PROPOFOL 10 MG/ML IV BOLUS
INTRAVENOUS | Status: AC
  Filled 2022-04-30: qty 20

## 2022-04-30 MED ORDER — SODIUM CHLORIDE 0.9 % IV SOLN
INTRAVENOUS | Status: DC
  Administered 2022-04-30: 1000 mL via INTRAVENOUS

## 2022-04-30 MED ORDER — PROPOFOL 10 MG/ML IV BOLUS
INTRAVENOUS | Status: DC | PRN
  Administered 2022-04-30: 40 mg via INTRAVENOUS
  Administered 2022-04-30: 50 mg via INTRAVENOUS
  Administered 2022-04-30 (×4): 40 mg via INTRAVENOUS
  Administered 2022-04-30: 50 mg via INTRAVENOUS
  Administered 2022-04-30: 40 mg via INTRAVENOUS
  Administered 2022-04-30: 100 mg via INTRAVENOUS
  Administered 2022-04-30 (×5): 40 mg via INTRAVENOUS

## 2022-04-30 NOTE — Anesthesia Postprocedure Evaluation (Signed)
Anesthesia Post Note ? ?Patient: Cassandra Brown ? ?Procedure(s) Performed: COLONOSCOPY ?ESOPHAGOGASTRODUODENOSCOPY (EGD) ? ?Patient location during evaluation: Endoscopy ?Anesthesia Type: General ?Level of consciousness: awake and alert ?Pain management: pain level controlled ?Vital Signs Assessment: post-procedure vital signs reviewed and stable ?Respiratory status: spontaneous breathing, nonlabored ventilation, respiratory function stable and patient connected to nasal cannula oxygen ?Cardiovascular status: blood pressure returned to baseline and stable ?Postop Assessment: no apparent nausea or vomiting ?Anesthetic complications: no ? ? ?No notable events documented. ? ? ?Last Vitals:  ?Vitals:  ? 04/30/22 1140 04/30/22 1150  ?BP: 122/75 (!) 147/95  ?Pulse: 74 71  ?Resp: 13 14  ?Temp:    ?SpO2: 99% 99%  ?  ?Last Pain:  ?Vitals:  ? 04/30/22 1150  ?TempSrc:   ?PainSc: 9   ? ? ?  ?  ?  ?  ?  ?  ? ?Precious Haws Melrose Kearse ? ? ? ? ?

## 2022-04-30 NOTE — Op Note (Signed)
[facility] ?Gastroenterology ?Patient Name: Cassandra Brown ?Procedure Date: 04/30/2022 10:21 AM ?MRN: [id number] ?Account #: [id number] ?Date of Birth: 06-Nov-1966 ?Admit Type: Outpatient ?Age: 56 ?Room: [facility] ENDO ROOM 1 ?Gender: Female ?Note Status: Finalized ?Instrument Name: Upper Endoscope [id number] ?Procedure:             Upper GI endoscopy ?Indications:           Epigastric abdominal pain, Esophageal reflux ?Providers:             [name] DO, DO ?Referring MD:          Baxter Hire, MD (Referring MD) ?Medicines:             Monitored Anesthesia Care ?Complications:         No immediate complications. Estimated blood loss:  ?                       Minimal. ?Procedure:             Pre-Anesthesia Assessment: ?                       - Prior to the procedure, a History and Physical was  ?                       performed, and patient medications and allergies were  ?                       reviewed. The patient is competent. The risks and  ?                       benefits of the procedure and the sedation options and  ?                       risks were discussed with the patient. All questions  ?                       were answered and informed consent was obtained.  ?                       Patient identification and proposed procedure were  ?                       verified by the physician, the nurse, the anesthetist  ?                       and the technician in the endoscopy suite. Mental  ?                       Status Examination: alert and oriented. Airway  ?                       Examination: normal oropharyngeal airway and neck  ?                       mobility. Respiratory Examination: clear to  ?                       auscultation. CV Examination: RRR, no murmurs, no S3  ?  or S4. Prophylactic Antibiotics: The patient does not  ?                       require prophylactic antibiotics. Prior  ?                       Anticoagulants: The patient has taken  Eliquis  ?                       (apixaban), last dose was 4 days prior to procedure.  ?                       ASA Grade Assessment: III - A patient with severe  ?                       systemic disease. After reviewing the risks and  ?                       benefits, the patient was deemed in satisfactory  ?                       condition to undergo the procedure. The anesthesia  ?                       plan was to use monitored anesthesia care (MAC).  ?                       Immediately prior to administration of medications,  ?                       the patient was re-assessed for adequacy to receive  ?                       sedatives. The heart rate, respiratory rate, oxygen  ?                       saturations, blood pressure, adequacy of pulmonary  ?                       ventilation, and response to care were monitored  ?                       throughout the procedure. The physical status of the  ?                       patient was re-assessed after the procedure. ?                       After obtaining informed consent, the endoscope was  ?                       passed under direct vision. Throughout the procedure,  ?                       the patient's blood pressure, pulse, and oxygen  ?                       saturations were monitored continuously. The Endoscope  ?  was introduced through the mouth, and advanced to the  ?                       second part of duodenum. The upper GI endoscopy was  ?                       accomplished without difficulty. The patient tolerated  ?                       the procedure well. ?Findings: ?     The duodenal bulb, first portion of the duodenum and second portion of  ?     the duodenum were normal. Estimated blood loss: none. ?     Localized mild inflammation characterized by erythema was found in the  ?     gastric antrum. Biopsies were taken with a cold forceps for Helicobacter  ?     pylori testing. Estimated blood loss was minimal. ?     A  single 1 to 2 mm sessile polyp with no bleeding and no stigmata of  ?     recent bleeding was found on the greater curvature of the stomach. The  ?     polyp was removed with a cold biopsy forceps. Resection and retrieval  ?     were complete. Estimated blood loss was minimal. ?     The exam of the stomach was otherwise normal. ?     Esophagogastric landmarks were identified: the gastroesophageal junction  ?     was found at 35 cm from the incisors. ?     The Z-line was irregular. Biopsies were taken with a cold forceps for  ?     histology. Tongue of salmon colored mucosa ~1cm Estimated blood loss was  ?     minimal. ?     Normal mucosa was found in the entire esophagus. The scope was  ?     withdrawn. Dilation was performed with a Maloney dilator with no  ?     resistance at 54 Fr. The dilation site was examined following endoscope  ?     reinsertion and showed no change. Estimated blood loss: none. ?     The exam of the esophagus was otherwise normal. ?Impression:            - Normal duodenal bulb, first portion of the duodenum  ?                       and second portion of the duodenum. ?                       - Gastritis. Biopsied. ?                       - A single gastric polyp. Resected and retrieved. ?                       - Esophagogastric landmarks identified. ?                       - Z-line irregular. Biopsied. ?                       - Normal mucosa was found in the entire esophagus.  ?  Dilated. ?Recommendation:        - Discharge patient to home. ?                       - Resume previous diet. ?                       - Continue present medications. ?                       - Await pathology results. ?                       - Return to GI clinic as previously scheduled. ?                       - The findings and recommendations were discussed with  ?                       the patient. ?Procedure Code(s):     --- Professional --- ?                       [phone],  Esophagogastroduodenoscopy, flexible,  ?                       transoral; with biopsy, single or multiple ?                       43450, Dilation of esophagus, by unguided sound or  ?                       bougie, single or multiple passes ?Diagnosis Code(s):     --- Professional --- ?                       K29.70, Gastritis, unspecified, without bleeding ?                       K31.7, Polyp of stomach and duodenum ?                       K22.8, Other specified diseases of esophagus ?                       R10.13, Epigastric pain ?                       K21.9, Gastro-esophageal reflux disease without  ?                       esophagitis ?CPT copyright 2019 [employer]. All rights reserved. ?The codes documented in this report are preliminary and upon coder review may  ?be revised to meet current compliance requirements. ?Attending Participation: ?     I personally performed the entire procedure. ?Volney American, DO ?[name] DO, DO ?04/30/2022 10:52:17 AM ?This report has been signed electronically. ?Number of Addenda: 0 ?Note Initiated On: 04/30/2022 10:21 AM ?Estimated Blood Loss:  Estimated blood loss was minimal. ?     [facility], [place] ?

## 2022-04-30 NOTE — Op Note (Signed)
[facility] ?Gastroenterology ?Patient Name: Cassandra Brown ?Procedure Date: 04/30/2022 10:21 AM ?MRN: [id number] ?Account #: [id number] ?Date of Birth: 25-Apr-1966 ?Admit Type: Outpatient ?Age: 56 ?Room: [facility] ENDO ROOM 1 ?Gender: Female ?Note Status: Finalized ?Instrument Name: Colonoscope [id number] ?Procedure:             Colonoscopy ?Indications:           High risk colon cancer surveillance: Personal history  ?                       of colonic polyps ?Providers:             [name] DO, DO ?Referring MD:          Baxter Hire, MD (Referring MD) ?Medicines:             Monitored Anesthesia Care ?Complications:         No immediate complications. Estimated blood loss:  ?                       Minimal. ?Procedure:             Pre-Anesthesia Assessment: ?                       - Prior to the procedure, a History and Physical was  ?                       performed, and patient medications and allergies were  ?                       reviewed. The patient is competent. The risks and  ?                       benefits of the procedure and the sedation options and  ?                       risks were discussed with the patient. All questions  ?                       were answered and informed consent was obtained.  ?                       Patient identification and proposed procedure were  ?                       verified by the physician, the nurse, the anesthetist  ?                       and the technician in the endoscopy suite. Mental  ?                       Status Examination: alert and oriented. Airway  ?                       Examination: normal oropharyngeal airway and neck  ?                       mobility. Respiratory Examination: clear to  ?  auscultation. CV Examination: RRR, no murmurs, no S3  ?                       or S4. Prophylactic Antibiotics: The patient does not  ?                       require prophylactic antibiotics. Prior  ?                        Anticoagulants: The patient has taken Eliquis  ?                       (apixaban), last dose was 4 days prior to procedure.  ?                       ASA Grade Assessment: III - A patient with severe  ?                       systemic disease. After reviewing the risks and  ?                       benefits, the patient was deemed in satisfactory  ?                       condition to undergo the procedure. The anesthesia  ?                       plan was to use monitored anesthesia care (MAC).  ?                       Immediately prior to administration of medications,  ?                       the patient was re-assessed for adequacy to receive  ?                       sedatives. The heart rate, respiratory rate, oxygen  ?                       saturations, blood pressure, adequacy of pulmonary  ?                       ventilation, and response to care were monitored  ?                       throughout the procedure. The physical status of the  ?                       patient was re-assessed after the procedure. ?                       After obtaining informed consent, the colonoscope was  ?                       passed under direct vision. Throughout the procedure,  ?                       the patient's blood pressure, pulse, and oxygen  ?  saturations were monitored continuously. The  ?                       Colonoscope was introduced through the anus and  ?                       advanced to the the cecum, identified by appendiceal  ?                       orifice and ileocecal valve. The colonoscopy was  ?                       performed without difficulty. The patient tolerated  ?                       the procedure well. The quality of the bowel  ?                       preparation was evaluated using the BBPS [facility] Bowel  ?                       Preparation Scale) with scores of: Right Colon = 3,  ?                       Transverse Colon = 3 and Left Colon = 3 (entire mucosa  ?                        seen well with no residual staining, small fragments  ?                       of stool or opaque liquid). The total BBPS score  ?                       equals 9. The ileocecal valve, appendiceal orifice,  ?                       and rectum were photographed. ?Findings: ?     Skin tags were found on perianal exam. ?     The digital rectal exam was normal. Pertinent negatives include normal  ?     sphincter tone. ?     A 10 to 13 mm polyp was found in the proximal ascending colon- first  ?     fold outside the cecum. The polyp was sessile. The polyp was removed  ?     with a hot snare. Resection and retrieval were complete. To prevent  ?     bleeding after the polypectomy, two hemostatic clips were successfully  ?     placed (MR conditional). There was no bleeding at the end of the  ?     procedure. Estimated blood loss was minimal. ?     Two sessile polyps were found in the rectum and transverse colon. The  ?     polyps were 3 to 4 mm in size. These polyps were removed with a cold  ?     snare. Resection and retrieval were complete. Estimated blood loss was  ?     minimal. ?     Non-bleeding external and internal hemorrhoids were found during  ?  retroflexion. Estimated blood loss: none. ?     The exam was otherwise without abnormality on direct and retroflexion  ?     views. ?Impression:            - Perianal skin tags found on perianal exam. ?                       - One 10 to 13 mm polyp in the proximal ascending  ?                       colon, removed with a hot snare. Resected and  ?                       retrieved. Clips (MR conditional) were placed. ?                       - Two 3 to 4 mm polyps in the rectum and in the  ?                       transverse colon, removed with a cold snare. Resected  ?                       and retrieved. ?                       - Non-bleeding external and internal hemorrhoids. ?                       - The examination was otherwise normal on direct and  ?                        retroflexion views. ?Recommendation:        - Discharge patient to home. ?                       - Resume previous diet. ?                       - Continue present medications. ?                       - Resume Eliquis (apixaban) at prior dose in 4 days.  ?                       Refer to managing physician for further adjustment of  ?                       therapy. ?                       - Await pathology results. ?                       - Repeat colonoscopy for surveillance based on  ?                       pathology results. ?                       - Return to GI office as previously scheduled. ?                       -  The findings and recommendations were discussed with  ?                       the patient. ?Procedure Code(s):     --- Professional --- ?                       [phone], Colonoscopy, flexible; with removal of  ?                       tumor(s), polyp(s), or other lesion(s) by snare  ?                       technique ?Diagnosis Code(s):     --- Professional --- ?                       Z86.010, Personal history of colonic polyps ?                       K63.5, Polyp of colon ?                       K62.1, Rectal polyp ?                       K64.8, Other hemorrhoids ?                       K64.4, Residual hemorrhoidal skin tags ?CPT copyright 2019 [employer]. All rights reserved. ?The codes documented in this report are preliminary and upon coder review may  ?be revised to meet current compliance requirements. ?Attending Participation: ?     I personally performed the entire procedure. ?Volney American, DO ?[name] DO, DO ?04/30/2022 11:35:39 AM ?This report has been signed electronically. ?Number of Addenda: 0 ?Note Initiated On: 04/30/2022 10:21 AM ?Scope Withdrawal Time: 0 hours 26 minutes 59 seconds  ?Total Procedure Duration: 0 hours 32 minutes 31 seconds  ?Estimated Blood Loss:  Estimated blood loss was minimal. ?     [facility] ?

## 2022-04-30 NOTE — Anesthesia Preprocedure Evaluation (Signed)
Anesthesia Evaluation  ?Patient identified by MRN, date of birth, ID band ?Patient awake ? ? ? ?Reviewed: ?Allergy & Precautions, NPO status , Patient's Chart, lab work & pertinent test results ? ?History of Anesthesia Complications ?Negative for: history of anesthetic complications ? ?Airway ?Mallampati: III ? ?TM Distance: <3 FB ?Neck ROM: full ? ? ? Dental ? ?(+) Upper Dentures, Lower Dentures ?  ?Pulmonary ?neg shortness of breath, asthma , sleep apnea , former smoker,  ?  ?Pulmonary exam normal ? ? ? ? ? ? ? Cardiovascular ?Exercise Tolerance: Good ?(-) angina+ CAD, + Past MI and +CHF  ?+ dysrhythmias Atrial Fibrillation  ? ? ?  ?Neuro/Psych ? Neuromuscular disease negative psych ROS  ? GI/Hepatic ?Neg liver ROS, GERD  Controlled,  ?Endo/Other  ?negative endocrine ROS ? Renal/GU ?negative Renal ROS  ?negative genitourinary ?  ?Musculoskeletal ? ? Abdominal ?  ?Peds ? Hematology ?negative hematology ROS ?(+)   ?Anesthesia Other Findings ?Past Medical History: ?No date: Allergic rhinitis due to pollen ?No date: Angina pectoris ([place]) ?No date: Atrial fibrillation ([place]) ?No date: CHF (congestive heart failure) ([place]) ?No date: Chronic maxillary sinusitis ?No date: Chronic pain syndrome ?    Comment:  BACK ?No date: Chronic right sacroiliac joint pain ?No date: Collagen vascular disease ([place]) ?No date: Coronary artery abnormality ?No date: Dysrhythmia ?    Comment:  AFIB,  FREQ PVC ?No date: Gastroparesis ?No date: GERD (gastroesophageal reflux disease) ?No date: History of GI bleed ?No date: History of kidney stones ?No date: Leakage of Watchman left atrial appendage closure device ?No date: Leukocytosis ?No date: Obesity ?No date: Patent foramen ovale ?No date: Rheumatoid arthritis ([place]) ?    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 ?Past  Surgical History: ?No date: APPENDECTOMY ?No date: BACK SURGERY ?No date: CHOLECYSTECTOMY ?No date: JOINT REPLACEMENT; Right ?    Comment:  knee ?No date: KNEE SURGERY; Left ?05/13/2021: NASAL HEMORRHAGE CONTROL; N/A ?    Comment:  Procedure: EPISTAXIS CONTROL;  Surgeon: Pryor Ochoa,  ?             Jeannie Fend, MD;  Location: [facility] ORS;  Service: ENT;   ?             Laterality: N/A; ?06/23/2020: PULSE GENERATOR IMPLANT; Left ?    Comment:  Procedure: LEFT FLANK PULSE GENERATOR IMPLANT;  Surgeon: ?             Deetta Perla, MD;  Location: [facility] ORS;  Service:  ?             Neurosurgery;  Laterality: Left; ?No date: SHOULDER SURGERY; Bilateral ?06/23/2020: THORACIC LAMINECTOMY FOR SPINAL CORD STIMULATOR; Bilateral ?    Comment:  Procedure: THORACIC SPINAL CORD STIMULATOR;  Surgeon:  ?             Deetta Perla, MD;  Location: [facility] ORS;  Service:  ?             Neurosurgery;  Laterality: Bilateral; ?02/27/2021: VIDEO BRONCHOSCOPY WITH ENDOBRONCHIAL NAVIGATION; N/A ?    Comment:  Procedure: VIDEO BRONCHOSCOPY WITH ENDOBRONCHIAL  ?             NAVIGATION;  Surgeon: Ottie Glazier, MD;  Location:  ?             [facility] ORS;  Service: Thoracic;  Laterality: N/A; ?02/27/2021: VIDEO BRONCHOSCOPY WITH ENDOBRONCHIAL  ULTRASOUND; N/A ?    Comment:  Procedure: VIDEO BRONCHOSCOPY WITH ENDOBRONCHIAL  ?             ULTRASOUND;  Surgeon: Ottie Glazier, MD;  Location:  ?             [facility] ORS;  Service: Thoracic;  Laterality: N/A; ? ?BMI   ? Body Mass Index: 36.84 kg/m?  ?  ? ? Reproductive/Obstetrics ?negative OB ROS ? ?  ? ? ? ? ? ? ? ? ? ? ? ? ? ?  ?  ? ? ? ? ? ? ? ? ?Anesthesia Physical ?Anesthesia Plan ? ?ASA: 3 ? ?Anesthesia Plan: General  ? ?Post-op Pain Management:   ? ?Induction: Intravenous ? ?PONV Risk Score and Plan: Propofol infusion and TIVA ? ?Airway Management Planned: Natural Airway and Nasal Cannula ? ?Additional Equipment:  ? ?Intra-op Plan:  ? ?Post-operative Plan:  ? ?Informed Consent: I have reviewed the patients History  and Physical, chart, labs and discussed the procedure including the risks, benefits and alternatives for the proposed anesthesia with the patient or authorized representative who has indicated his/her understanding and acceptance.  ? ? ? ?Dental Advisory Given ? ?Plan Discussed with: Anesthesiologist, CRNA and Surgeon ? ?Anesthesia Plan Comments: (Patient consented for risks of anesthesia including but not limited to:  ?- adverse reactions to medications ?- risk of airway placement if required ?- damage to eyes, teeth, lips or other oral mucosa ?- nerve damage due to positioning  ?- sore throat or hoarseness ?- Damage to heart, brain, nerves, lungs, other parts of body or loss of life ? ?Patient voiced understanding.)  ? ? ? ? ? ? ?Anesthesia Quick Evaluation ? ?

## 2022-04-30 NOTE — Transfer of Care (Signed)
Immediate Anesthesia Transfer of Care Note ? ?Patient: Cassandra Brown ? ?Procedure(s) Performed: COLONOSCOPY ?ESOPHAGOGASTRODUODENOSCOPY (EGD) ? ?Patient Location: Endoscopy Unit ? ?Anesthesia Type:General ? ?Level of Consciousness: awake, alert  and oriented ? ?Airway & Oxygen Therapy: Patient Spontanous Breathing and Patient connected to nasal cannula oxygen ? ?Post-op Assessment: Report given to RN, Post -op Vital signs reviewed and stable and Patient moving all extremities ? ?Post vital signs: Reviewed and stable ? ?Last Vitals:  ?Vitals Value Taken Time  ?BP 113/65 04/30/22 1132  ?Temp    ?Pulse 77 04/30/22 1132  ?Resp 23 04/30/22 1132  ?SpO2 99 % 04/30/22 1132  ? ? ?Last Pain:  ?Vitals:  ? 04/30/22 1003  ?TempSrc: Temporal  ?PainSc: 8   ?   ? ?  ? ?Complications: No notable events documented. ?

## 2022-04-30 NOTE — H&P (Signed)
? ?Pre-Procedure H&P ?  ?Patient ID: Cassandra Brown is a 56 y.o. female. ? ?Gastroenterology Provider: [name], DO ? ?Referring Provider: Laurine Blazer, PA ?PCP: Baxter Hire, MD ? ?Date: 04/30/2022 ? ?HPI ?[name] is a 56 y.o. female who presents today for Esophagogastroduodenoscopy and Colonoscopy for Acid reflux; dark stools abdominal pain rule out GI bleed. ?Patient with granulomatosis with polyangiitis.  Has a history of epistaxis ?With history of acid reflux and suspected gastroparesis.  She was on Eliquis for atrial fibrillation and is now status post Watchman device. 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 Last dose was on Monday. ?She has had ongoing nausea vomiting and epigastric pain with food.  Mild dysphagia to pills and solids.  No issues with liquids.  No odynophagia. ?She has noted dark stools as well as constipation. Takes otc medications to help with constipation. ?Hemoglobin discomfort 9.7-11.9 with MCV of 83 ferritin 130 creatinine 0.9. ? ?Uses vape daily quit tobacco in 2012 ?2019 EGD with [facility] and underwent empiric dilation [address].  Biopsies of the esophagus were negative for EOE ? ?Also underwent colonoscopy of [facility]ie [facility]a which were reportedly unremarkable ? ?Status postcholecystectomy, appendectomy, and spinal cord stimulator ? ?Past Medical History:  ?Diagnosis Date  ? Allergic rhinitis due to pollen   ? Angina pectoris ([place])   ? Atrial fibrillation ([place])   ? CHF (congestive heart failure) ([place])   ? Chronic maxillary sinusitis   ? Chronic pain syndrome   ? BACK  ? Chronic right sacroiliac joint pain   ? Collagen vascular disease ([place])   ? Coronary artery abnormality   ? Dysrhythmia   ? AFIB,  FREQ PVC  ? Gastroparesis   ? GERD (gastroesophageal reflux disease)   ? History of GI bleed   ? History of kidney stones   ? Leakage of Watchman left atrial appendage closure device   ? Leukocytosis   ? Obesity   ? Patent foramen  ovale   ? Rheumatoid arthritis ([place])   ? OSTEOARTHRITIS  ? Sleep apnea   ? Spinal stenosis of lumbar region   ? Unequal leg length   ? Vasculitis ([place])   ? Wegener's disease, pulmonary   ? ? ?Past Surgical History:  ?Procedure Laterality Date  ? APPENDECTOMY    ? BACK SURGERY    ? CHOLECYSTECTOMY    ? JOINT REPLACEMENT Right   ? knee  ? KNEE SURGERY Left   ? NASAL HEMORRHAGE CONTROL N/A 05/13/2021  ? Procedure: EPISTAXIS CONTROL;  Surgeon: Carloyn Manner, MD;  Location: [facility] ORS;  Service: ENT;  Laterality: N/A;  ? PULSE GENERATOR IMPLANT Left 06/23/2020  ? Procedure: LEFT FLANK PULSE GENERATOR IMPLANT;  Surgeon: Deetta Perla, MD;  Location: [facility] ORS;  Service: Neurosurgery;  Laterality: Left;  ? SHOULDER SURGERY Bilateral   ? THORACIC LAMINECTOMY FOR SPINAL CORD STIMULATOR Bilateral 06/23/2020  ? Procedure: THORACIC SPINAL CORD STIMULATOR;  Surgeon: Deetta Perla, MD;  Location: [facility] ORS;  Service: Neurosurgery;  Laterality: Bilateral;  ? VIDEO BRONCHOSCOPY WITH ENDOBRONCHIAL NAVIGATION N/A 02/27/2021  ? Procedure: VIDEO BRONCHOSCOPY WITH ENDOBRONCHIAL NAVIGATION;  Surgeon: Ottie Glazier, MD;  Location: [facility] ORS;  Service: Thoracic;  Laterality: N/A;  ? VIDEO BRONCHOSCOPY WITH ENDOBRONCHIAL ULTRASOUND N/A 02/27/2021  ? Procedure: VIDEO BRONCHOSCOPY WITH ENDOBRONCHIAL ULTRASOUND;  Surgeon: Ottie Glazier, MD;  Location: [facility] ORS;  Service: Thoracic;  Laterality: N/A;  ? ? ?Family History ?No h/o GI disease or malignancy ? ?Review of Systems  ?  Constitutional:  Negative for activity change, appetite change, chills, diaphoresis, fatigue, fever and unexpected weight change.  ?HENT:  Positive for trouble swallowing (pills/solids). Negative for voice change. Sore throat: epigastric.  ?Respiratory:  Negative for shortness of breath and wheezing.   ?Cardiovascular:  Negative for chest pain, palpitations and leg swelling.  ?Gastrointestinal:  Positive for abdominal pain and blood in stool (dark stool). Negative for  abdominal distention, anal bleeding, constipation, diarrhea, nausea, rectal pain and vomiting.  ?Musculoskeletal:  Negative for arthralgias and myalgias.  ?Skin:  Negative for color change and pallor.  ?Neurological:  Negative for dizziness, syncope and weakness.  ?Psychiatric/Behavioral:  Negative for confusion.   ?All other systems reviewed and are negative.  ? ?Medications ?No current facility-administered medications on file prior to encounter.  ? ?Current Outpatient Medications on File Prior to Encounter  ?Medication Sig Dispense Refill  ? BAC 50-325-40 MG tablet Take 1 tablet by mouth every 4 (four) hours as needed for headache.    ? budesonide (PULMICORT) 0.5 MG/2ML nebulizer solution Take 0.5 mg by nebulization 2 (two) times daily.    ? carisoprodol (SOMA) 350 MG tablet Take 350 mg by mouth 3 (three) times daily.    ? cholecalciferol (VITAMIN D3) 25 MCG (1000 UNIT) tablet Take 1,000 Units by mouth daily.    ? clobetasol cream (TEMOVATE) 7.85 % Apply 1 application topically 2 (two) times daily as needed (rash).    ? DUPIXENT 300 MG/2ML SOPN Inject 300 mg into the skin every 14 (fourteen) days.    ? EMGALITY 120 MG/ML SOAJ Inject 120 mg into the skin every 28 (twenty-eight) days.    ? folic acid (FOLVITE) 1 MG tablet Take 1 mg by mouth daily.    ? furosemide (LASIX) 20 MG tablet Take 20 mg by mouth daily.    ? gentamicin ointment (GARAMYCIN) 0.1 % Apply 1 application topically daily as needed (rash).    ? HYDROcodone-acetaminophen (NORCO) 10-325 MG tablet Take 1 tablet by mouth every 6 (six) hours as needed.    ? methotrexate 2.5 MG tablet Take 25 mg by mouth once a week. Caution:Chemotherapy. Protect from light.    ? montelukast (SINGULAIR) 10 MG tablet Take 10 mg by mouth at bedtime.    ? NURTEC 75 MG TBDP Take 75 mg by mouth daily as needed for migraine.    ? pantoprazole (PROTONIX) 40 MG tablet Take 40 mg by mouth 2 (two) times daily.    ? pentoxifylline (TRENTAL) 400 MG CR tablet Take 400 mg by mouth in  the morning and at bedtime.    ? pimecrolimus (ELIDEL) 1 % cream Apply 1 application topically 2 (two) times daily as needed (rash).    ? pregabalin (LYRICA) 75 MG capsule Take 75 mg by mouth 2 (two) times daily.    ? propranolol ER (INDERAL LA) 80 MG 24 hr capsule Take 80 mg by mouth at bedtime.    ? rosuvastatin (CRESTOR) 10 MG tablet Take 10 mg by mouth at bedtime.    ? sodium chloride (OCEAN) 0.65 % SOLN nasal spray Place 1 spray into both nostrils as needed for congestion.    ? sotalol (BETAPACE) 80 MG tablet Take 40 mg by mouth 2 (two) times daily.    ? Specialty Vitamins Products (CENTRUM PERFORMANCE) TABS Take 1 tablet by mouth daily.    ? sucralfate (CARAFATE) 1 g tablet Take 1 g by mouth 2 (two) times daily before a meal.    ? triamcinolone cream (KENALOG) 0.1 %  Apply 1 application topically daily as needed (itching).     ? aspirin EC 81 MG tablet Take 81 mg by mouth daily. Swallow whole. (Patient not taking: Reported on 04/30/2022)    ? naloxone (NARCAN) nasal spray 4 mg/0.1 mL For opioid overdose or excessive sleepiness after taking narcotics 1 each 0  ? promethazine (PHENERGAN) 25 MG tablet Take 25 mg by mouth every 6 (six) hours as needed for nausea or vomiting.     ? ? ?Pertinent medications related to GI and procedure were reviewed by me with the patient prior to the procedure ? ? ?Current Facility-Administered Medications:  ?  0.9 %  sodium chloride infusion, , Intravenous, Continuous, [name], DO, Last Rate: 20 mL/hr at 04/30/22 1022, 1,000 mL at 04/30/22 1022 ?  ?  ? ?Allergies  ?Allergen Reactions  ? Cyclobenzaprine Hives  ? Ibuprofen Hives  ? Morphine Itching and Swelling  ?  Other reaction(s): itching/swelling ?  ? Meperidine Itching and Swelling  ? Sulfa Antibiotics Swelling  ? Onion Itching  ?  Can use onion powder  ? ?Allergies were reviewed by me prior to the procedure ? ?Objective  ? ?Body mass index is 36.84 kg/m?. ?Vitals:  ? 04/30/22 1003  ?BP: 127/67  ?Pulse: 64  ?Resp: 18   ?Temp: (!) 97.3 ?F (36.3 ?C)  ?TempSrc: Temporal  ?SpO2: 100%  ?Weight: 91.4 kg  ?Height: '5\' 2"'$  (1.575 m)  ? ? ? ?Physical Exam ?Vitals and nursing note reviewed.  ?Constitutional:   ?   General: She is not

## 2022-04-30 NOTE — Interval H&P Note (Signed)
History and Physical Interval Note: Preprocedure H&P from 04/30/22 ? was reviewed and there was no interval change after seeing and examining the patient.  Written consent was obtained from the patient after discussion of risks, benefits, and alternatives. Patient has consented to proceed with Esophagogastroduodenoscopy and Colonoscopy with possible intervention ? ? ?04/30/2022 ?10:33 AM ? ?Cassandra Brown  has presented today for surgery, with the diagnosis of Epigastric pain (R10.13) ?Gastroesophageal reflux disease, unspecified whether esophagitis present (K21.9) ?Nausea (R11.0) ?Personal history of colonic polyps (Z86.010).  The various methods of treatment have been discussed with the patient and family. After consideration of risks, benefits and other options for treatment, the patient has consented to  Procedure(s): ?COLONOSCOPY (N/A) ?ESOPHAGOGASTRODUODENOSCOPY (EGD) (N/A) as a surgical intervention.  The patient's history has been reviewed, patient examined, no change in status, stable for surgery.  I have reviewed the patient's chart and labs.  Questions were answered to the patient's satisfaction.   ? ? ?[name] ? ? ?

## 2022-05-03 ENCOUNTER — Encounter: Payer: Self-pay | Admitting: Gastroenterology

## 2022-05-03 LAB — SURGICAL PATHOLOGY

## 2022-07-16 ENCOUNTER — Other Ambulatory Visit: Payer: Self-pay | Admitting: Pulmonary Disease

## 2022-07-16 DIAGNOSIS — R918 Other nonspecific abnormal finding of lung field: Secondary | ICD-10-CM

## 2022-07-27 ENCOUNTER — Ambulatory Visit
Admission: RE | Admit: 2022-07-27 | Discharge: 2022-07-27 | Disposition: A | Discharge status: Home / Self Care | Payer: Medicare Other | Source: Ambulatory Visit | Attending: Pulmonary Disease | Admitting: Pulmonary Disease

## 2022-07-27 DIAGNOSIS — R918 Other nonspecific abnormal finding of lung field: Secondary | ICD-10-CM

## 2022-07-27 MED ORDER — IOPAMIDOL (ISOVUE-300) INJECTION 61%
100.0000 mL | Freq: Once | INTRAVENOUS | Status: AC | PRN
  Administered 2022-07-27: 100 mL via INTRAVENOUS

## 2022-08-02 ENCOUNTER — Other Ambulatory Visit: Payer: Self-pay | Admitting: Student

## 2022-08-02 DIAGNOSIS — M542 Cervicalgia: Secondary | ICD-10-CM

## 2022-08-11 ENCOUNTER — Other Ambulatory Visit: Payer: Self-pay | Admitting: Student

## 2022-08-11 DIAGNOSIS — M542 Cervicalgia: Secondary | ICD-10-CM

## 2022-08-19 ENCOUNTER — Ambulatory Visit
Admission: RE | Admit: 2022-08-19 | Discharge: 2022-08-19 | Disposition: A | Discharge status: Home / Self Care | Payer: Medicare Other | Source: Ambulatory Visit | Attending: Student | Admitting: Student

## 2022-08-19 ENCOUNTER — Ambulatory Visit (HOSPITAL_BASED_OUTPATIENT_CLINIC_OR_DEPARTMENT_OTHER): Payer: Medicare Other | Admitting: Student in an Organized Health Care Education/Training Program

## 2022-08-19 ENCOUNTER — Encounter: Payer: Self-pay | Admitting: Student in an Organized Health Care Education/Training Program

## 2022-08-19 VITALS — BP 122/70 | HR 59 | Temp 97.8°F | Resp 16 | Ht 62.0 in | Wt 198.0 lb

## 2022-08-19 DIAGNOSIS — G894 Chronic pain syndrome: Secondary | ICD-10-CM

## 2022-08-19 DIAGNOSIS — M47816 Spondylosis without myelopathy or radiculopathy, lumbar region: Secondary | ICD-10-CM | POA: Diagnosis not present

## 2022-08-19 DIAGNOSIS — M542 Cervicalgia: Secondary | ICD-10-CM | POA: Diagnosis present

## 2022-08-19 NOTE — Patient Instructions (Signed)
Radiofrequency Ablation Radiofrequency ablation is a procedure that is performed to relieve pain. The procedure is often used for back, neck, or arm pain. Radiofrequency ablation involves the use of a machine that creates radio waves to make heat. During the procedure, the heat is applied to the nerve that carries the pain signal. The heat damages the nerve and interferes with the pain signal. Pain relief usually starts about 2 weeks after the procedure and lasts for 6 months to 1 year. Tell a health care provider about: Any allergies you have. All medicines you are taking, including vitamins, herbs, eye drops, creams, and over-the-counter medicines. Any problems you or family members have had with anesthetic medicines. Any bleeding problems you have. Any surgeries you have had. Any medical conditions you have. Whether you are pregnant or may be pregnant. What are the risks? Generally, this is a safe procedure. However, problems may occur, including: Pain or soreness at the injection site. Allergic reaction to medicines given during the procedure. Bleeding. Infection at the injection site. Damage to nerves or blood vessels. What happens before the procedure? When to stop eating and drinking Follow instructions from your health care provider about what you may eat and drink before your procedure. These may include: 8 hours before the procedure Stop eating most foods. Do not eat meat, fried foods, or fatty foods. Eat only light foods, such as toast or crackers. All liquids are okay except energy drinks and alcohol. 6 hours before the procedure Stop eating. Drink only clear liquids, such as water, clear fruit juice, black coffee, plain tea, and sports drinks. Do not drink energy drinks or alcohol. 2 hours before the procedure Stop drinking all liquids. You may be allowed to take medicine with small sips of water. If you do not follow your health care provider's instructions, your  procedure may be delayed or canceled. Medicines Ask your health care provider about: Changing or stopping your regular medicines. This is especially important if you are taking diabetes medicines or blood thinners. Taking medicines such as aspirin and ibuprofen. These medicines can thin your blood. Do not take these medicines unless your health care provider tells you to take them. Taking over-the-counter medicines, vitamins, herbs, and supplements. General instructions Ask your health care provider what steps will be taken to help prevent infection. These steps may include: Removing hair at the procedure site. Washing skin with a germ-killing soap. Taking antibiotic medicine. If you will be going home right after the procedure, plan to have a responsible adult: Take you home from the hospital or clinic. You will not be allowed to drive. Care for you for the time you are told. What happens during the procedure?  You will be awake during the procedure. You will need to be able to talk with the health care provider during the procedure. An IV will be inserted into one of your veins. You will be given one or more of the following: A medicine to help you relax (sedative). A medicine to numb the area (local anesthetic). Your health care provider will insert a radiofrequency needle into the area to be treated. This is done with the help of fluoroscopy. A wire that carries the radio waves (electrode) will be put through the radiofrequency needle. An electrical pulse will be sent through the electrode to verify the correct nerve that is causing your pain. You will feel a tingling sensation, and you may have muscle twitching. The tissue around the needle tip will be heated by an  electric current that comes from the radiofrequency machine. This will numb the nerves. The needle will be removed. A bandage (dressing) will be put on the insertion area. The procedure may vary among health care providers  and hospitals. What happens after the procedure? Your blood pressure, heart rate, breathing rate, and blood oxygen level will be monitored until you leave the hospital or clinic. Return to your normal activities as told by your health care provider. Ask your health care provider what activities are safe for you. If you were given a sedative during the procedure, it can affect you for several hours. Do not drive or operate machinery until your health care provider says that it is safe. Summary Radiofrequency ablation is a procedure that is performed to relieve pain. The procedure is often used for back, neck, or arm pain. Radiofrequency ablation involves the use of a machine that creates radio waves to make heat. Plan to have a responsible adult take you home from the hospital or clinic. Do not drive or operate machinery until your health care provider says that it is safe. Return to your normal activities as told by your health care provider. Ask your health care provider what activities are safe for you. This information is not intended to replace advice given to you by your health care provider. Make sure you discuss any questions you have with your health care provider. Document Revised: 06/02/2021 Document Reviewed: 06/02/2021 [employer] Patient Education  [employer]. Pain Management Discharge Instructions  General Discharge Instructions :  If you need to reach your doctor call: Monday-Friday 8:00 am - 4:00 pm at [phone] or toll free [phone].  After clinic hours [phone] to have operator reach doctor.  Bring all of your medication bottles to all your appointments in the pain clinic.  To cancel or reschedule your appointment with Pain Management please remember to call 24 hours in advance to avoid a fee.  Refer to the educational materials which you have been given on: General Risks, I had my Procedure. Discharge Instructions, Post Sedation.  Post Procedure  Instructions:  The drugs you were given will stay in your system until tomorrow, so for the next 24 hours you should not drive, make any legal decisions or drink any alcoholic beverages.  You may eat anything you prefer, but it is better to start with liquids then soups and crackers, and gradually work up to solid foods.  Please notify your doctor immediately if you have any unusual bleeding, trouble breathing or pain that is not related to your normal pain.  Depending on the type of procedure that was done, some parts of your body may feel week and/or numb.  This usually clears up by tonight or the next day.  Walk with the use of an assistive device or accompanied by an adult for the 24 hours.  You may use ice on the affected area for the first 24 hours.  Put ice in a Ziploc bag and cover with a towel and place against area 15 minutes on 15 minutes off.  You may switch to heat after 24 hours.Facet Blocks Patient Information  Description: The facets are joints in the spine between the vertebrae.  Like any joints in the body, facets can become irritated and painful.  Arthritis can also effect the facets.  By injecting steroids and local anesthetic in and around these joints, we can temporarily block the nerve supply to them.  Steroids act directly on irritated nerves and tissues to reduce  selling and inflammation which often leads to decreased pain.  Facet blocks may be done anywhere along the spine from the neck to the low back depending upon the location of your pain.   After numbing the skin with local anesthetic (like Novocaine), a small needle is passed onto the facet joints under x-ray guidance.  You may experience a sensation of pressure while this is being done.  The entire block usually lasts about 15-25 minutes.   Conditions which may be treated by facet blocks:  Low back/buttock pain Neck/shoulder pain Certain types of headaches  Preparation for the injection:  Do not eat any  solid food or dairy products within 8 hours of your appointment. You may drink clear liquid up to 3 hours before appointment.  Clear liquids include water, black coffee, juice or soda.  No milk or cream please. You may take your regular medication, including pain medications, with a sip of water before your appointment.  Diabetics should hold regular insulin (if taken separately) and take 1/2 normal NPH dose the morning of the procedure.  Carry some sugar containing items with you to your appointment. A driver must accompany you and be prepared to drive you home after your procedure. Bring all your current medications with you. An IV may be inserted and sedation may be given at the discretion of the physician. A blood pressure cuff, EKG and other monitors will often be applied during the procedure.  Some patients may need to have extra oxygen administered for a short period. You will be asked to provide medical information, including your allergies and medications, prior to the procedure.  We must know immediately if you are taking blood thinners (like Coumadin/Warfarin) or if you are allergic to IV iodine contrast (dye).  We must know if you could possible be pregnant.  Possible side-effects:  Bleeding from needle site Infection (rare, may require surgery) Nerve injury (rare) Numbness & tingling (temporary) Difficulty urinating (rare, temporary) Spinal headache (a headache worse with upright posture) Light-headedness (temporary) Pain at injection site (serveral days) Decreased blood pressure (rare, temporary) Weakness in arm/leg (temporary) Pressure sensation in back/neck (temporary)   Call if you experience:  Fever/chills associated with headache or increased back/neck pain Headache worsened by an upright position New onset, weakness or numbness of an extremity below the injection site Hives or difficulty breathing (go to the emergency room) Inflammation or drainage at the injection  site(s) Severe back/neck pain greater than usual New symptoms which are concerning to you  Please note:  Although the local anesthetic injected can often make your back or neck feel good for several hours after the injection, the pain will likely return. It takes 3-7 days for steroids to work.  You may not notice any pain relief for at least one week.  If effective, we will often do a series of 2-3 injections spaced 3-6 weeks apart to maximally decrease your pain.  After the initial series, you may be a candidate for a more permanent nerve block of the facets.  If you have any questions, please call #[phone]) [facility]

## 2022-08-19 NOTE — Progress Notes (Signed)
PROVIDER NOTE: Information contained herein reflects review and annotations entered in association with encounter. Interpretation of such information and data should be left to medically-trained personnel. Information provided to patient can be located elsewhere in the medical record under "Patient Instructions". Document created using STT-dictation technology, any transcriptional errors that may result from process are unintentional.    Patient: Cassandra Brown  Service Category: E/M  Provider: Gillis Santa, MD  DOB: [birth year]  DOS: 08/19/2022  Referring Provider: Baxter Hire, MD  MRN: [id number]  Specialty: Interventional Pain Management  PCP: Baxter Hire, MD  Type: Established Patient  Setting: Ambulatory outpatient    Location: Office  Delivery: Face-to-face     HPI  Cassandra Brown, a 56 y.o. year old female, is here today because of her Lumbar facet arthropathy [M47.816]. Cassandra Brown primary complain today is Back Pain (lower) Last encounter: My last encounter with her was on 11/25/2021 Pertinent problems: Cassandra Brown has Chronic pain syndrome and Lumbar spondylosis on their pertinent problem list. Pain Assessment: Severity of Chronic pain is reported as a 8 /10. Location: Back Lower/both legs to the knee. Onset: More than a month ago. Quality: Sharp. Timing: Constant. Modifying factor(s): nothing. Vitals:  height is 5' 2"  (1.575 m) and weight is 198 lb (89.8 kg). Her temporal temperature is 97.8 F (36.6 C). Her blood pressure is 122/70 and her pulse is 59 (abnormal). Her respiration is 16 and oxygen saturation is 100%.   Reason for encounter:   Worsening low back pain, worse with lumbar extension. Her last visit with me was on 11/25/2021 for a left SI joint and left piriformis injection which was helpful for her lower buttock and groin pain. She states that the pain is higher up in her lower lumbar spine.  She does have pain with lumbar extension and facet loading. She has  attempted to complete physician directed home physical therapy/exercise regimen for this but has had limited response in regards to her pain.  ROS  Constitutional: Denies any fever or chills Gastrointestinal: No reported hemesis, hematochezia, vomiting, or acute GI distress Musculoskeletal:  Low back pain Neurological: No reported episodes of acute onset apraxia, aphasia, dysarthria, agnosia, amnesia, paralysis, loss of coordination, or loss of consciousness  Medication Review  Centrum Performance, Dupilumab, EPINEPHrine, Galcanezumab-gnlm, HYDROcodone-acetaminophen, Rimegepant Sulfate, Tiotropium Bromide Monohydrate, albuterol, apixaban, aspirin EC, budesonide, butalbital-acetaminophen-caffeine, carisoprodol, chlorpheniramine-HYDROcodone, cholecalciferol, clobetasol cream, feeding supplement, fluticasone, fluticasone-salmeterol, folic acid, furosemide, gentamicin ointment, guaiFENesin, loratadine, methotrexate, montelukast, naloxone, pantoprazole, pentoxifylline, pimecrolimus, pregabalin, promethazine, propranolol ER, rosuvastatin, sodium chloride, sotalol, sucralfate, and triamcinolone cream  History Review  Allergy: Cassandra Brown is allergic to cyclobenzaprine, ibuprofen, morphine, meperidine, sulfa antibiotics, and onion. Drug: Cassandra Brown  reports no history of drug use. Alcohol:  reports that she does not currently use alcohol. Tobacco:  reports that she quit smoking about 11 years ago. Her smoking use included cigarettes. She has a 99.00 pack-year smoking history. She has never used smokeless tobacco. Social: Cassandra Brown  reports that she quit smoking about 11 years ago. Her smoking use included cigarettes. She has a 99.00 pack-year smoking history. She has never used smokeless tobacco. She reports that she does not currently use alcohol. She reports that she does not use drugs. Medical:  has a past medical history of Allergic rhinitis due to pollen, Angina pectoris ([place]), Atrial fibrillation  ([place]), CHF (congestive heart failure) ([place]), Chronic maxillary sinusitis, Chronic pain syndrome, Chronic right sacroiliac joint pain, Collagen vascular disease ([place]), Coronary artery abnormality, Dysrhythmia, Gastroparesis,  GERD (gastroesophageal reflux disease), History of GI bleed, History of kidney stones, Leakage of Watchman left atrial appendage closure device, Leukocytosis, Obesity, Patent foramen ovale, Rheumatoid arthritis ([place]), Sleep apnea, Spinal stenosis of lumbar region, Unequal leg length, Vasculitis ([place]), and Wegener's disease, pulmonary. Surgical: Cassandra Brown  has a past surgical history that includes Joint replacement (Right); Knee surgery (Left); Shoulder surgery (Bilateral); Appendectomy; Cholecystectomy; Thoracic laminectomy for spinal cord stimulator (Bilateral, 06/23/2020); Pulse generator implant (Left, 06/23/2020); Video bronchoscopy with endobronchial ultrasound (N/A, 02/27/2021); Video bronchoscopy with endobronchial navigation (N/A, 02/27/2021); Nasal hemorrhage control (N/A, 05/13/2021); Back surgery; Colonoscopy (N/A, 04/30/2022); and Esophagogastroduodenoscopy (N/A, 04/30/2022). Family: family history is not on file.  Laboratory Chemistry Profile   Renal Lab Results  Component Value Date   BUN 11 01/12/2022   CREATININE 0.65 01/12/2022   GFRAA >60 06/19/2020   GFRNONAA >60 01/12/2022    Hepatic Lab Results  Component Value Date   AST 25 01/10/2022   ALT 25 01/10/2022   ALBUMIN 2.3 (L) 01/10/2022   ALKPHOS 47 01/10/2022   LIPASE 58 (H) 01/04/2022    Electrolytes Lab Results  Component Value Date   NA 144 01/12/2022   K 4.5 01/12/2022   CL 103 01/12/2022   CALCIUM 8.9 01/12/2022   MG 2.0 01/10/2022   PHOS 3.3 01/11/2022    Bone No results found for: "VD25OH", "VD125OH2TOT", "HT3428JG8", "TL5726OM3", "25OHVITD1", "25OHVITD2", "25OHVITD3", "TESTOFREE", "TESTOSTERONE"  Inflammation (CRP: Acute Phase) (ESR: Chronic Phase) Lab Results  Component Value Date   CRP  6.2 (H) 01/12/2022   LATICACIDVEN 1.3 01/05/2022         Note: Above Lab results reviewed.  Recent Imaging Review  CT CHEST W CONTRAST CLINICAL DATA:  Lung nodules.  EXAM: CT CHEST WITH CONTRAST  TECHNIQUE: Multidetector CT imaging of the chest was performed during intravenous contrast administration.  RADIATION DOSE REDUCTION: This exam was performed according to the departmental dose-optimization program which includes automated exposure control, adjustment of the mA and/or kV according to patient size and/or use of iterative reconstruction technique.  CONTRAST:  133m ISOVUE-300 IOPAMIDOL (ISOVUE-300) INJECTION 61%  COMPARISON:  01/09/2022.  FINDINGS: Cardiovascular: The heart is mildly enlarged and there is no pericardial effusion. Scattered coronary artery calcifications are noted. An occlusion device is noted in the left atrial appendage. There is atherosclerotic calcification of the aorta without evidence of aneurysm. The pulmonary trunk is normal in caliber.  Mediastinum/Nodes: No enlarged mediastinal, hilar, or axillary lymph nodes. Thyroid gland, trachea, and esophagus demonstrate no significant findings. There is a small hiatal hernia.  Lungs/Pleura: Mild emphysematous changes are present in the lungs. Strandy opacities are noted bilaterally, likely sequela of prior infection. And improved from the prior exam. No effusion or pneumothorax. There is a 1.3 cm nodule in the right middle lobe, axial image 89, unchanged. No new nodule is identified.  Upper Abdomen: The gallbladder is surgically absent. A cyst is present in the mid right kidney.  Musculoskeletal: A neurostimulator device is present over the flank region on the left with leads terminating in the thoracic spinal canal. Degenerative changes are present in the thoracic spine. No acute osseous abnormality.  IMPRESSION: 1. Strandy opacities in the lungs bilaterally, significantly decreased from the  prior exam and likely sequela of prior infection. 2. Stable 1.3 cm nodule in the right middle lobe, unchanged from 2022. Patient has history of granulomatosis with polyangiitis. No specific follow-up is recommended given the overall stability. 3. Emphysema. 4. Aortic atherosclerosis and coronary artery calcifications. 5. Right renal cyst.  Electronically Signed   By: Brett Fairy M.D.   On: 07/27/2022 23:20   CLINICAL DATA:  Mid to low back pain radiating into the left buttock for 1 month. No known injury. History of conditional spinal cord stimulator.   EXAM: MRI THORACIC AND LUMBAR SPINE WITHOUT CONTRAST   TECHNIQUE: Multiplanar and multiecho pulse sequences of the thoracic and lumbar spine were obtained without intravenous contrast. There was power outage during the lumbar spine portion of this examination, and the sagittal T1 weighted sequence is incomplete.   COMPARISON:  Chest CT 01/21/2021. Thoracic MRI 02/25/2020 and lumbar MRI 07/31/2019.   FINDINGS: MRI THORACIC SPINE FINDINGS   Alignment:  Physiologic.   Vertebrae: No acute or suspicious osseous findings.   Cord: The thoracic cord appears normal in signal and caliber. Artifact from the thoracic spinal stimulator is noted inferiorly.   Paraspinal and other soft tissues: No significant paraspinal findings. Cavitary lung disease seen on prior chest CT appears mildly improved, although is incompletely visualized and suboptimally evaluated.   Disc levels:   No significant disc space findings at or above T5-6.   T6-7: Stable small left paracentral disc protrusion without mass effect on the cord.   T7-8: Stable mild disc bulging.   T8-9: Normal interspace.   T9-10: Persistent moderate sized left paracentral disc extrusion with mild mass effect on the cord. Stable mild foraminal narrowing bilaterally.   Stable mild disc bulging from T10-11 through T12-L1 without associated spinal stenosis.   MRI  LUMBAR SPINE FINDINGS   Segmentation: Conventional anatomy assumed, with the last open disc space designated L5-S1.Concordant with previous imaging.   Alignment:  Stable mild convex left scoliosis centered at L3.   Vertebrae: No worrisome osseous lesion, acute fracture or pars defect. Asymmetric endplate degenerative changes on the right at L3-4.   Conus medullaris: Extends to the L2 level and appears normal.   Paraspinal and other soft tissues: No significant paraspinal findings.   Disc levels:   L1-2: Stable loss of disc height with annular disc bulging and endplate osteophytes. No significant spinal stenosis or nerve root encroachment.   L2-3: Stable annular disc bulging with endplate osteophytes and mild bilateral facet hypertrophy. Stable mild right-greater-than-left foraminal narrowing.   L3-4: Loss of disc height with annular disc bulging and endplate osteophytes asymmetric to the right. Mild facet and ligamentous hypertrophy. The spinal canal and lateral recesses are patent. Stable moderate right foraminal narrowing.   L4-5: Disc height and hydration are maintained. Mild bilateral facet hypertrophy. No significant spinal stenosis or nerve root encroachment.   L5-S1: Stable mild disc bulging eccentric to the left with asymmetric left-sided facet hypertrophy. Stable chronic moderate left foraminal narrowing.   IMPRESSION: 1. Interval thoracic spinal stimulator placement. 2. Stable thoracic disc protrusions, largest on the left at T9-10 with mild mass effect on the cord. 3. Stable multilevel lumbar spondylosis with chronic moderate left foraminal narrowing at L5-S1, potentially symptomatic. There is moderate right-sided foraminal narrowing at L3-4. 4. No acute findings are evident. 5. The previously demonstrated cavitary lung disease appears improved, although is suboptimally evaluated by this study. Correlate with prior biopsy results.      Note: Reviewed         Physical Exam  General appearance: Well nourished, well developed, and well hydrated. In no apparent acute distress Mental status: Alert, oriented x 3 (person, place, & time)       Respiratory: No evidence of acute respiratory distress Eyes: PERLA Vitals: BP 122/70   Pulse Marland Kitchen)  59   Temp 97.8 F (36.6 C) (Temporal)   Resp 16   Ht 5' 2"  (1.575 m)   Wt 198 lb (89.8 kg)   SpO2 100%   BMI 36.21 kg/m  BMI: Estimated body mass index is 36.21 kg/m as calculated from the following:   Height as of this encounter: 5' 2"  (1.575 m).   Weight as of this encounter: 198 lb (89.8 kg). Ideal: Ideal body weight: 50.1 kg (110 lb 7.2 oz) Adjusted ideal body weight: 66 kg (145 lb 7.5 oz)  Lumbar Spine Area Exam  Skin & Axial Inspection: No masses, redness, or swelling Alignment: Symmetrical Functional ROM: Pain restricted ROM       Stability: No instability detected Muscle Tone/Strength: Functionally intact. No obvious neuro-muscular anomalies detected. Sensory (Neurological): Musculoskeletal pain pattern, articular pain pattern mediated by facets Palpation: No palpable anomalies       Provocative Tests: Hyperextension/rotation test: (+) bilaterally for facet joint pain. Lumbar quadrant test (Kemp's test): (+) bilaterally for facet joint pain.  Gait & Posture Assessment  Ambulation: Limited Gait: Antalgic Posture: Difficulty standing up straight, due to pain   Lower Extremity Exam    Side: Right lower extremity  Side: Left lower extremity  Stability: No instability observed          Stability: No instability observed          Skin & Extremity Inspection: Skin color, temperature, and hair growth are WNL. No peripheral edema or cyanosis. No masses, redness, swelling, asymmetry, or associated skin lesions. No contractures.  Skin & Extremity Inspection: Skin color, temperature, and hair growth are WNL. No peripheral edema or cyanosis. No masses, redness, swelling, asymmetry, or associated skin  lesions. No contractures.  Functional ROM: Restricted ROM                  Functional ROM: Pain restricted ROM                  Muscle Tone/Strength: Functionally intact. No obvious neuro-muscular anomalies detected.  Muscle Tone/Strength: Functionally intact. No obvious neuro-muscular anomalies detected.  Sensory (Neurological): Unimpaired        Sensory (Neurological): Unimpaired        DTR: Patellar: deferred today Achilles: deferred today Plantar: deferred today  DTR: Patellar: deferred today Achilles: deferred today Plantar: deferred today  Palpation: No palpable anomalies  Palpation: No palpable anomalies    Assessment   Diagnosis Status  1. Lumbar facet arthropathy   2. Lumbar spondylosis   3. Chronic pain syndrome    Controlled Controlled Controlled   Updated Problems: Problem  Chronic Pain Syndrome   Cassandra Brown reports low back pain since she injured her back in junior high after playing sports. The pain continued to worsen in her early 20's. She says her pain "sometimes radiates down and will catch on me if I'm walking. Then sometimes I feel like it wants to give way". She feels her pain is worse when walking, sitting for long periods of time, or rolling over in bed. She doesn't feel that anything makes the pain better. She says her back pain keeps her from being able to do activities she enjoys like kayaking and hiking.  She denies having any surgeries for her back pain in the past. She has completed PT and seen a chiropractor in the past without any benefit.   Lumbar Spondylosis    [place] has a history of greater than 3 months of  moderate to severe pain which is resulted in functional impairment.  The patient has tried various conservative therapeutic options such as NSAIDs, Tylenol, muscle relaxants, physical therapy which was inadequately effective.  Patient's pain is predominantly axial with physical exam findings suggestive of facet  arthropathy. Lumbar facet medial branch nerve blocks were discussed with the patient.  Risks and benefits were reviewed.  Patient would like to proceed with bilateral L3, L4, L5 medial branch nerve block.   Orders:  Orders Placed This Encounter  Procedures   LUMBAR FACET(MEDIAL BRANCH NERVE BLOCK) MBNB    Standing Status:   Future    Standing Expiration Date:   11/19/2022    Scheduling Instructions:     Procedure: Lumbar facet block (AKA.: Lumbosacral medial branch nerve block)     Side: Bilateral     Level: L3-4 & L5-S1 Facets  L3, L4, L5, Medial Branch Nerves)     Sedation: Local     Timeframe: ASAA    Order Specific Question:   Where will this procedure be performed?    Answer:   [facility]   Follow-up plan:   Return in about 2 weeks (around 09/02/2022) for B/L L3, 4, 5 MBNB, in clinic NS.     Left L4/5 ESI #1 07/13/21, left piriformis, left sacroiliac joint injection 09/09/2021, 10/21/21, 11/25/21     Recent Visits No visits were found meeting these conditions. Showing recent visits within past 90 days and meeting all other requirements Today's Visits Date Type Provider Dept  08/19/22 Office Visit Gillis Santa, MD [facility]  Showing today's visits and meeting all other requirements Future Appointments No visits were found meeting these conditions. Showing future appointments within next 90 days and meeting all other requirements  I discussed the assessment and treatment plan with the patient. The patient was provided an opportunity to ask questions and all were answered. The patient agreed with the plan and demonstrated an understanding of the instructions.  Patient advised to call back or seek an in-person evaluation if the symptoms or condition worsens.  Duration of encounter:110mnutes.  Total time on encounter, as per AMA guidelines included both the face-to-face and non-face-to-face time personally spent by the physician and/or other qualified health  care professional(s) on the day of the encounter (includes time in activities that require the physician or other qualified health care professional and does not include time in activities normally performed by clinical staff). Physician's time may include the following activities when performed: preparing to see the patient (eg, review of tests, pre-charting review of records) obtaining and/or reviewing separately obtained history performing a medically appropriate examination and/or evaluation counseling and educating the patient/family/caregiver ordering medications, tests, or procedures referring and communicating with other health care professionals (when not separately reported) documenting clinical information in the electronic or other health record independently interpreting results (not separately reported) and communicating results to the patient/ family/caregiver care coordination (not separately reported)  Note by: BGillis Santa MD Date: 08/19/2022; Time: 2:46 PM

## 2022-08-21 IMAGING — MR MR HEAD W/O CM
11 series · 48 of 48 positions shown · non-contrast
Comparison: 08/09/2020

CLINICAL DATA: Headaches with word-finding difficulty for 3 months.

EXAM:
MRI HEAD WITHOUT CONTRAST
TECHNIQUE: Multiplanar, multiecho pulse sequences of the brain and surrounding
structures were obtained without intravenous contrast.

[Series 5: ax dwi_tracew · axial · 3.0mm · 0.71mm/px · z∈[-76,+89]mm · 6 of 56 slices shown]
[im 1/56]
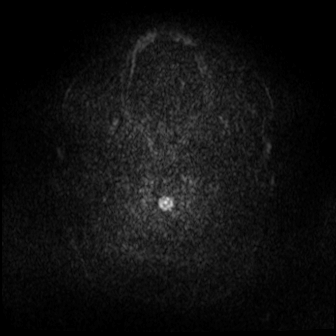
[im 12/56]
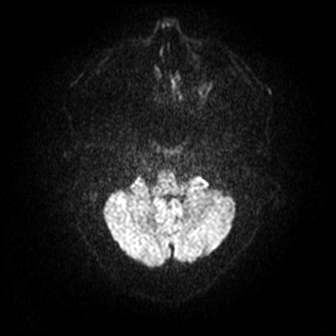
[im 23/56]
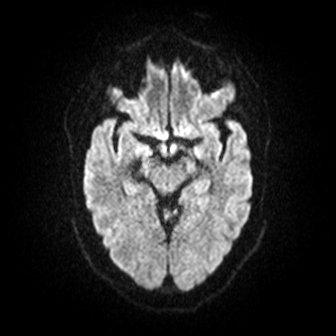
[im 34/56]
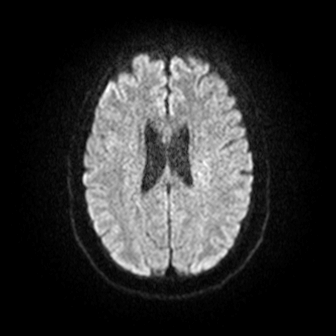
[im 45/56]
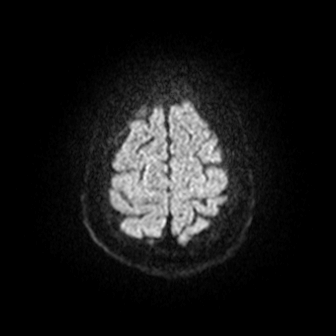
[im 56/56]
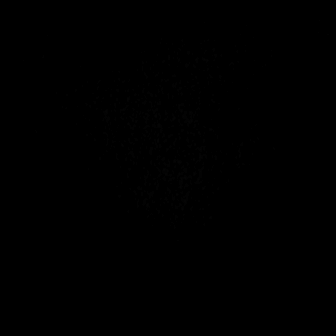

[Series 6: ax dwi_adc · axial · 3.0mm · 0.71mm/px · z∈[-76,+83]mm · 4 of 54 slices shown]
[im 1/54]
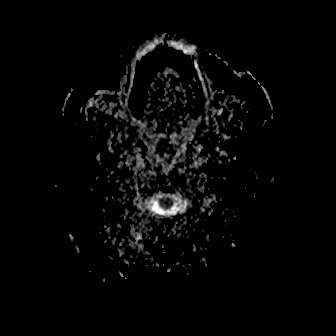
[im 18/54]
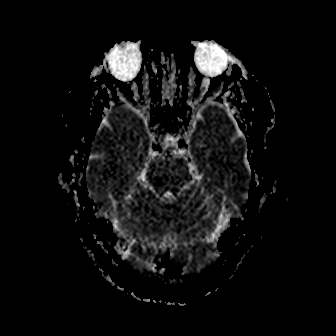
[im 36/54]
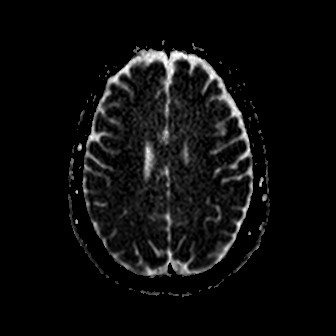
[im 54/54]
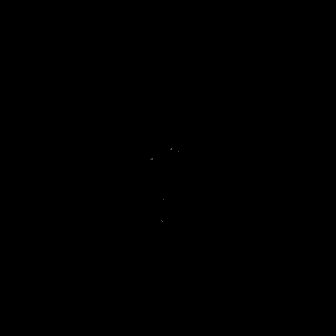

[Series 7: cor dwi_tracew · coronal · 5.0mm · 0.68mm/px · 3 of 38 slices shown]
[im 1/38]
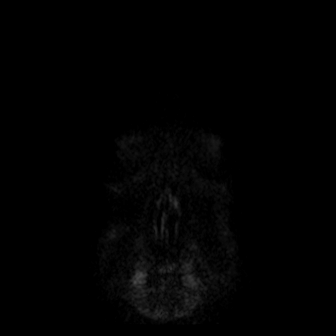
[im 19/38]
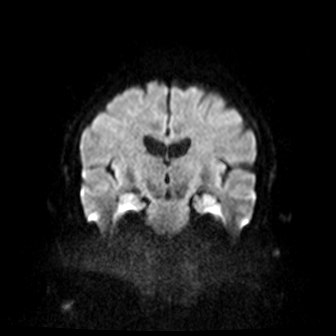
[im 38/38]
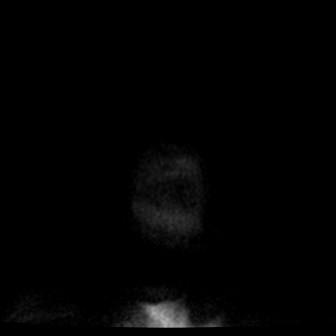

[Series 8: cor dwi_adc · coronal · 5.0mm · 0.68mm/px · 3 of 38 slices shown]
[im 1/38]
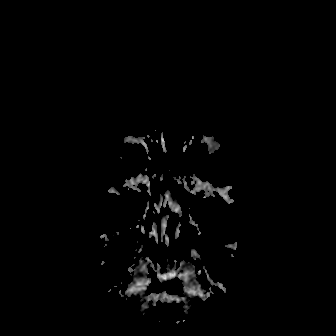
[im 19/38]
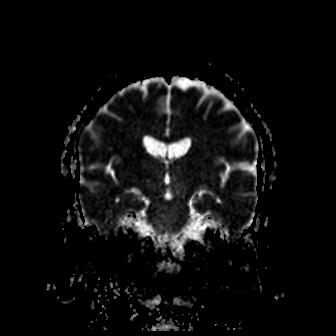
[im 38/38]
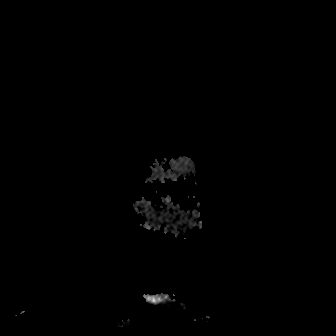

[Series 9: T1 · sagittal · 5.0mm · 0.47mm/px · 2 of 22 slices shown (1 of 2)]
[im 1/22]
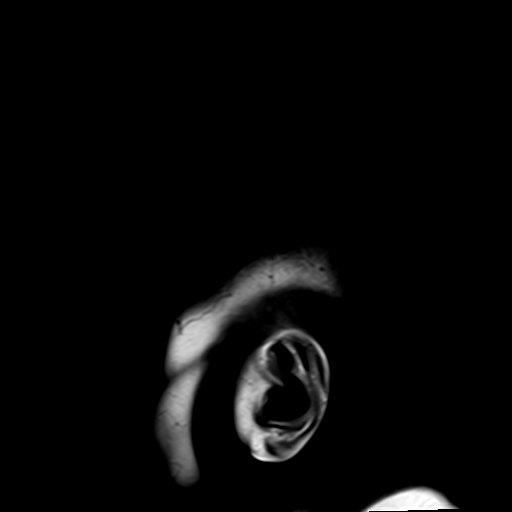
[im 22/22]
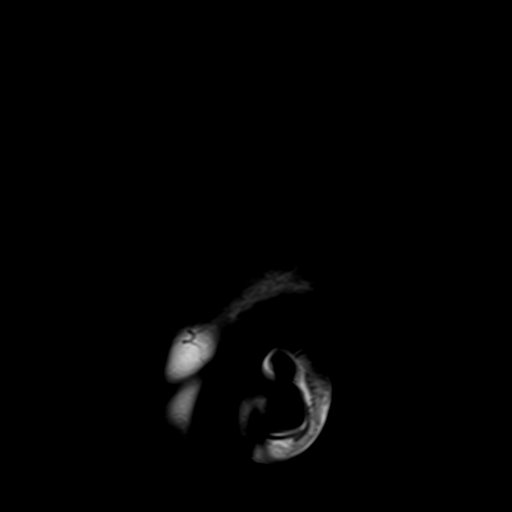

[Series 10: T2 · axial · 5.0mm · 0.86mm/px · z∈[-71,+84]mm · 2 of 27 slices shown (1 of 2)]
[im 1/27]
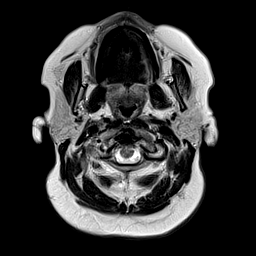
[im 27/27]
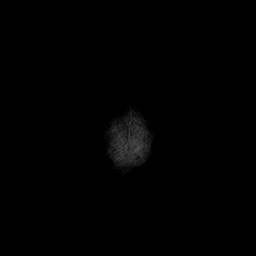

[Series 12: pha_images · axial · 3.0mm · 0.90mm/px · z∈[-69,+83]mm · 4 of 52 slices shown]
[im 1/52]
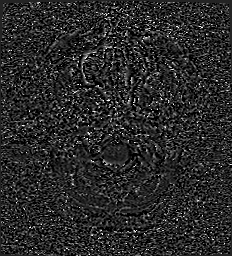
[im 18/52]
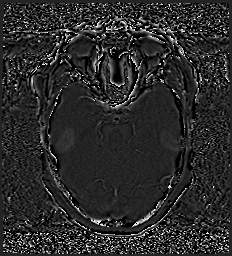
[im 35/52]
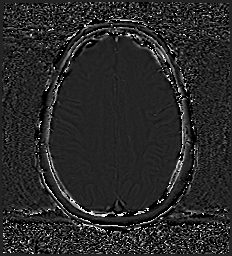
[im 52/52]
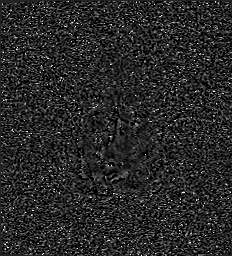

[Series 13: swi_images · axial · 3.0mm · 0.90mm/px · z∈[-69,+83]mm · 4 of 52 slices shown]
[im 1/52]
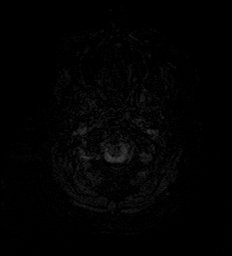
[im 18/52]
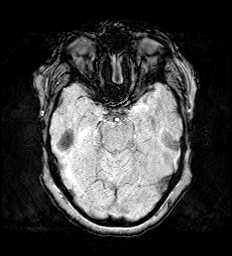
[im 35/52]
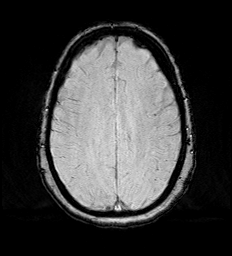
[im 52/52]
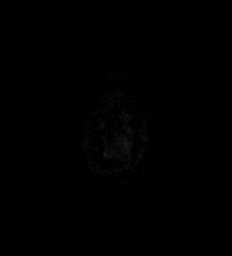

[Series 15: FLAIR · axial · 3.0mm · 0.69mm/px · z∈[-74,+87]mm · 5 of 55 slices shown]
[im 1/55]
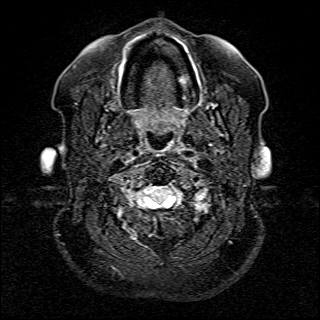
[im 14/55]
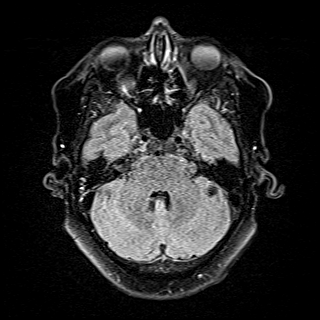
[im 28/55]
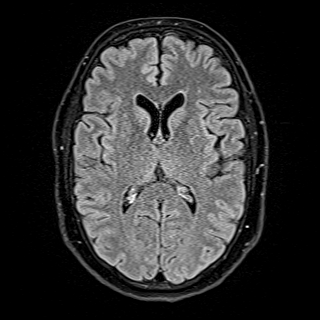
[im 41/55]
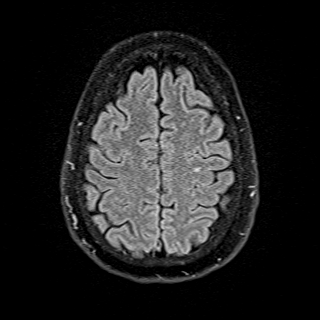
[im 55/55]
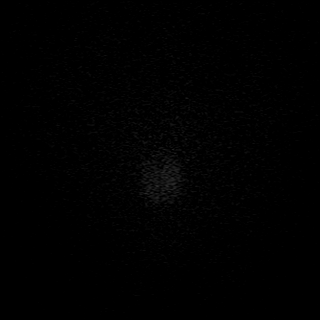

[Series 16: T1 · axial · 1.0mm · 0.98mm/px · z∈[-72,+86]mm · 13 of 160 slices shown (2 of 2)]
[im 1/160]
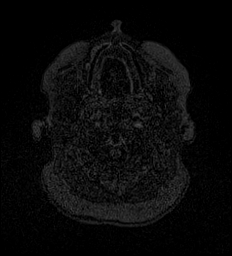
[im 14/160]
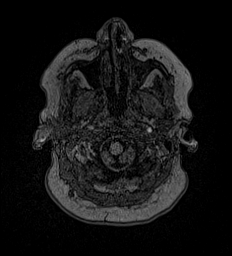
[im 27/160]
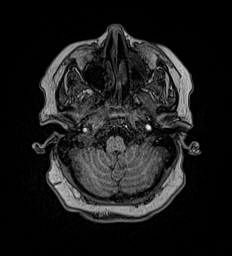
[im 40/160]
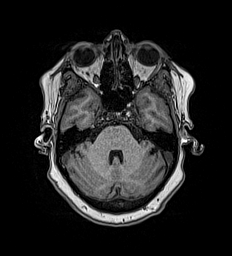
[im 54/160]
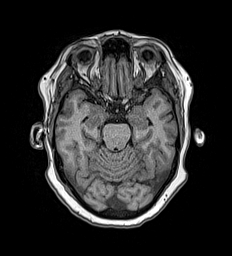
[im 67/160]
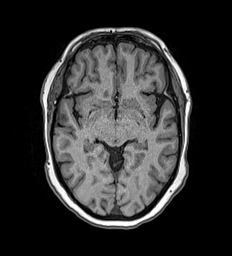
[im 80/160]
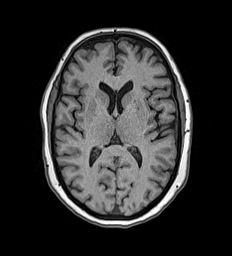
[im 93/160]
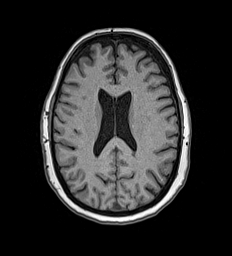
[im 107/160]
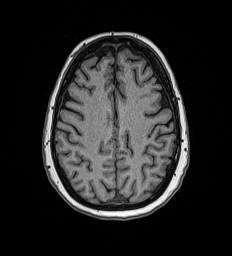
[im 120/160]
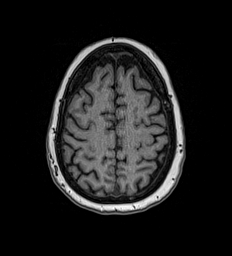
[im 133/160]
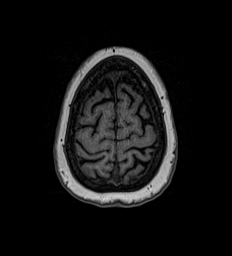
[im 146/160]
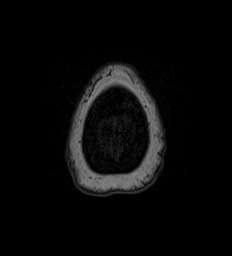
[im 160/160]
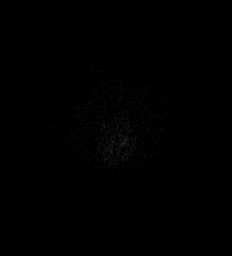

[Series 17: T2 · coronal · 5.0mm · 0.69mm/px · 2 of 30 slices shown (2 of 2)]
[im 1/30]
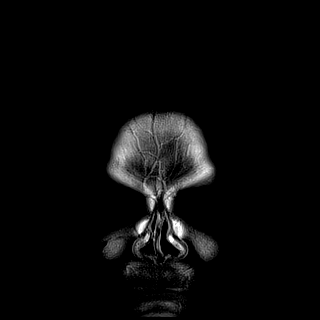
[im 30/30]
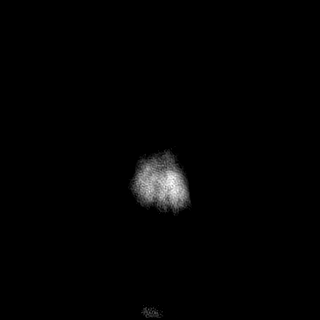

[48 of 48 positions shown; findings below may reference images not displayed]

FINDINGS: Brain: No acute or subacute infarction, hemorrhage, hydrocephalus,
extra-axial collection or mass lesion.

Small FLAIR hyperintensities seemingly randomly scattered in the
cerebral white matter, mild in extent for patient age. Brain volume
is normal.

Vascular: Preserved flow voids.

Skull and upper cervical spine: Normal marrow signal room

Sinuses/Orbits: Left maxillary sinus circumferential mucosal
thickening with atelectasis. Partial right more than left mastoid
opacification.
IMPRESSION: 1. No recent insult or specific cause for symptoms.
2. Small remote white matter insults with limited extent. This can
be seen with chronic small vessel ischemia, migraine, trauma, or
multiple inflammatory processes.
3. Chronic left maxillary sinusitis. Partial mastoid opacification
greater on the right.

## 2022-08-24 ENCOUNTER — Other Ambulatory Visit (HOSPITAL_COMMUNITY): Payer: Self-pay | Admitting: Student

## 2022-08-24 ENCOUNTER — Other Ambulatory Visit: Payer: Self-pay | Admitting: Student

## 2022-08-24 DIAGNOSIS — M542 Cervicalgia: Secondary | ICD-10-CM

## 2022-08-24 DIAGNOSIS — G43719 Chronic migraine without aura, intractable, without status migrainosus: Secondary | ICD-10-CM

## 2022-09-13 ENCOUNTER — Ambulatory Visit
Payer: Medicare Other | Attending: Student in an Organized Health Care Education/Training Program | Admitting: Student in an Organized Health Care Education/Training Program

## 2022-09-13 ENCOUNTER — Ambulatory Visit
Admission: RE | Admit: 2022-09-13 | Discharge: 2022-09-13 | Disposition: A | Discharge status: Home / Self Care | Payer: Medicare Other | Source: Ambulatory Visit | Attending: Student in an Organized Health Care Education/Training Program | Admitting: Student in an Organized Health Care Education/Training Program

## 2022-09-13 ENCOUNTER — Encounter: Payer: Self-pay | Admitting: Student in an Organized Health Care Education/Training Program

## 2022-09-13 DIAGNOSIS — M47816 Spondylosis without myelopathy or radiculopathy, lumbar region: Secondary | ICD-10-CM | POA: Insufficient documentation

## 2022-09-13 DIAGNOSIS — G894 Chronic pain syndrome: Secondary | ICD-10-CM | POA: Insufficient documentation

## 2022-09-13 MED ORDER — LIDOCAINE HCL 2 % IJ SOLN
INTRAMUSCULAR | Status: AC
  Filled 2022-09-13: qty 20

## 2022-09-13 MED ORDER — ROPIVACAINE HCL 2 MG/ML IJ SOLN
9.0000 mL | Freq: Once | INTRAMUSCULAR | Status: AC
  Administered 2022-09-13: 9 mL via PERINEURAL

## 2022-09-13 MED ORDER — DEXAMETHASONE SODIUM PHOSPHATE 10 MG/ML IJ SOLN
10.0000 mg | Freq: Once | INTRAMUSCULAR | Status: AC
  Administered 2022-09-13: 10 mg

## 2022-09-13 MED ORDER — ROPIVACAINE HCL 2 MG/ML IJ SOLN
INTRAMUSCULAR | Status: AC
  Filled 2022-09-13: qty 20

## 2022-09-13 MED ORDER — LIDOCAINE HCL 2 % IJ SOLN
20.0000 mL | Freq: Once | INTRAMUSCULAR | Status: AC
  Administered 2022-09-13: 400 mg

## 2022-09-13 MED ORDER — DEXAMETHASONE SODIUM PHOSPHATE 10 MG/ML IJ SOLN
INTRAMUSCULAR | Status: AC
  Filled 2022-09-13: qty 2

## 2022-09-13 NOTE — Progress Notes (Signed)
Safety precautions to be maintained throughout the outpatient stay will include: orient to surroundings, keep bed in low position, maintain call bell within reach at all times, provide assistance with transfer out of bed and ambulation.  

## 2022-09-13 NOTE — Progress Notes (Signed)
PROVIDER NOTE: Interpretation of information contained herein should be left to medically-trained personnel. Specific patient instructions are provided elsewhere under "Patient Instructions" section of medical record. This document was created in part using STT-dictation technology, any transcriptional errors that may result from this process are unintentional.  Patient: Cassandra Brown Type: Established DOB: [birth year] MRN: [id number] PCP: Baxter Hire, MD  Service: Procedure DOS: 09/13/2022 Setting: Ambulatory Location: Ambulatory outpatient facility Delivery: Face-to-face Provider: Gillis Santa, MD Specialty: Interventional Pain Management Specialty designation: 09 Location: Outpatient facility Ref. Prov.: Gillis Santa, MD    Procedure:           Type: Lumbar Facet, Medial Branch Block(s) #1  Laterality: Bilateral  Level: L3, L4, L5, Medial Branch Level(s). Injecting these levels blocks the L3-4 L4-5lumbar facet joints. Imaging: Fluoroscopic guidance         Anesthesia: Local anesthesia (1-2% Lidocaine) DOS: 09/13/2022 Performed by: Gillis Santa, MD  Primary Purpose: Diagnostic/Therapeutic Indications: Low back pain severe enough to impact quality of life or function. 1. Lumbar facet arthropathy   2. Lumbar spondylosis   3. Chronic pain syndrome    NAS-11 Pain score:   Pre-procedure: 8 /10   Post-procedure: 4  (able to bend and move better than when she came in)/10     Position / Prep / Materials:  Position: Prone  Prep solution: DuraPrep (Iodine Povacrylex [0.7% available iodine] and Isopropyl Alcohol, 74% w/w) Area Prepped: Posterolateral Lumbosacral Spine (Wide prep: From the lower border of the scapula down to the end of the tailbone and from flank to flank.)  Materials:  Tray: Block Needle(s):  Type: Spinal  Gauge (G): 22  Length: 3.5-in Qty: 2     Pre-op H&P Assessment:  Cassandra Brown is a 56 y.o. (year old), female patient, seen today for interventional  treatment. She  has a past surgical history that includes Joint replacement (Right); Knee surgery (Left); Shoulder surgery (Bilateral); Appendectomy; Cholecystectomy; Thoracic laminectomy for spinal cord stimulator (Bilateral, 06/23/2020); Pulse generator implant (Left, 06/23/2020); Video bronchoscopy with endobronchial ultrasound (N/A, 02/27/2021); Video bronchoscopy with endobronchial navigation (N/A, 02/27/2021); Nasal hemorrhage control (N/A, 05/13/2021); Back surgery; Colonoscopy (N/A, 04/30/2022); and Esophagogastroduodenoscopy (N/A, 04/30/2022). Cassandra Brown has a current medication list which includes the following prescription(s): advair hfa, albuterol, bac, carisoprodol, cholecalciferol, clobetasol cream, dupixent, emgality, epinephrine, folic acid, furosemide, gentamicin ointment, hydrocodone-acetaminophen, methotrexate, montelukast, naloxone, nurtec, pantoprazole, pentoxifylline, pimecrolimus, pregabalin, promethazine, propranolol er, rosuvastatin, sodium chloride, sotalol, centrum performance, spiriva respimat, sucralfate, triamcinolone cream, apixaban, aspirin ec, budesonide, chlorpheniramine-hydrocodone, feeding supplement, fluticasone, guaifenesin, and loratadine. Her primarily concern today is the Back Pain (lower) and Neck Pain  Initial Vital Signs:  Pulse/HCG Rate:  (!) 54  Temp: 97.6 F (36.4 C) Resp: 16 BP: 112/67 SpO2: 97 %  BMI: Estimated body mass index is 36.58 kg/m as calculated from the following:   Height as of this encounter: '5\' 2"'$  (1.575 m).   Weight as of this encounter: 200 lb (90.7 kg).  Risk Assessment: Allergies: Reviewed. She is allergic to cyclobenzaprine, ibuprofen, morphine, meperidine, sulfa antibiotics, and onion.  Allergy Precautions: None required Coagulopathies: Reviewed. None identified.  Blood-thinner therapy: None at this time Active Infection(s): Reviewed. None identified. Cassandra Brown is afebrile  Site Confirmation: Cassandra Brown was asked to confirm the  procedure and laterality before marking the site Procedure checklist: Completed Consent: Before the procedure and under the influence of no sedative(s), amnesic(s), or anxiolytics, the patient was informed of the treatment options, risks and possible complications. To fulfill our ethical and legal  obligations, as recommended by the American Medical Association's Code of Ethics, I have informed the patient of my clinical impression; the nature and purpose of the treatment or procedure; the risks, benefits, and possible complications of the intervention; the alternatives, including doing nothing; the risk(s) and benefit(s) of the alternative treatment(s) or procedure(s); and the risk(s) and benefit(s) of doing nothing. The patient was provided information about the general risks and possible complications associated with the procedure. These may include, but are not limited to: failure to achieve desired goals, infection, bleeding, organ or nerve damage, allergic reactions, paralysis, and death. In addition, the patient was informed of those risks and complications associated to Spine-related procedures, such as failure to decrease pain; infection (i.e.: Meningitis, epidural or intraspinal abscess); bleeding (i.e.: epidural hematoma, subarachnoid hemorrhage, or any other type of intraspinal or peri-dural bleeding); organ or nerve damage (i.e.: Any type of peripheral nerve, nerve root, or spinal cord injury) with subsequent damage to sensory, motor, and/or autonomic systems, resulting in permanent pain, numbness, and/or weakness of one or several areas of the body; allergic reactions; (i.e.: anaphylactic reaction); and/or death. Furthermore, the patient was informed of those risks and complications associated with the medications. These include, but are not limited to: allergic reactions (i.e.: anaphylactic or anaphylactoid reaction(s)); adrenal axis suppression; blood sugar elevation that in diabetics may result  in ketoacidosis or comma; water retention that in patients with history of congestive heart failure may result in shortness of breath, pulmonary edema, and decompensation with resultant heart failure; weight gain; swelling or edema; medication-induced neural toxicity; particulate matter embolism and blood vessel occlusion with resultant organ, and/or nervous system infarction; and/or aseptic necrosis of one or more joints. Finally, the patient was informed that Medicine is not an exact science; therefore, there is also the possibility of unforeseen or unpredictable risks and/or possible complications that may result in a catastrophic outcome. The patient indicated having understood very clearly. We have given the patient no guarantees and we have made no promises. Enough time was given to the patient to ask questions, all of which were answered to the patient's satisfaction. Ms. Tejera has indicated that she wanted to continue with the procedure. Attestation: I, the ordering provider, attest that I have discussed with the patient the benefits, risks, side-effects, alternatives, likelihood of achieving goals, and potential problems during recovery for the procedure that I have provided informed consent. Date  Time: 09/13/2022 10:49 AM  Pre-Procedure Preparation:  Monitoring: As per clinic protocol. Respiration, ETCO2, SpO2, BP, heart rate and rhythm monitor placed and checked for adequate function Safety Precautions: Patient was assessed for positional comfort and pressure points before starting the procedure. Time-out: I initiated and conducted the "Time-out" before starting the procedure, as per protocol. The patient was asked to participate by confirming the accuracy of the "Time Out" information. Verification of the correct person, site, and procedure were performed and confirmed by me, the nursing staff, and the patient. "Time-out" conducted as per Joint Commission's Universal Protocol  (UP.01.01.01). Time: 1127  Description of Procedure:          Laterality: Bilateral. The procedure was performed in identical fashion on both sides. Targeted Levels:   L3, L4, L5, Medial Branch Level(s)  Safety Precautions: Aspiration looking for blood return was conducted prior to all injections. At no point did we inject any substances, as a needle was being advanced. Before injecting, the patient was told to immediately notify me if she was experiencing any new onset of "ringing in the  ears, or metallic taste in the mouth". No attempts were made at seeking any paresthesias. Safe injection practices and needle disposal techniques used. Medications properly checked for expiration dates. SDV (single dose vial) medications used. After the completion of the procedure, all disposable equipment used was discarded in the proper designated medical waste containers. Local Anesthesia: Protocol guidelines were followed. The patient was positioned over the fluoroscopy table. The area was prepped in the usual manner. The time-out was completed. The target area was identified using fluoroscopy. A 12-in long, straight, sterile hemostat was used with fluoroscopic guidance to locate the targets for each level blocked. Once located, the skin was marked with an approved surgical skin marker. Once all sites were marked, the skin (epidermis, dermis, and hypodermis), as well as deeper tissues (fat, connective tissue and muscle) were infiltrated with a small amount of a short-acting local anesthetic, loaded on a 10cc syringe with a 25G, 1.5-in  Needle. An appropriate amount of time was allowed for local anesthetics to take effect before proceeding to the next step. Local Anesthetic: Lidocaine 2.0% The unused portion of the local anesthetic was discarded in the proper designated containers. Technical description of process:   L3 Medial Branch Nerve Block (MBB): The target area for the L3 medial branch is at the junction of  the postero-lateral aspect of the superior articular process and the superior, posterior, and medial edge of the transverse process of L4. Under fluoroscopic guidance, a Quincke needle was inserted until contact was made with os over the superior postero-lateral aspect of the pedicular shadow (target area). After negative aspiration for blood, 11m of the nerve block solution was injected without difficulty or complication. The needle was removed intact. L4 Medial Branch Nerve Block (MBB): The target area for the L4 medial branch is at the junction of the postero-lateral aspect of the superior articular process and the superior, posterior, and medial edge of the transverse process of L5. Under fluoroscopic guidance, a Quincke needle was inserted until contact was made with os over the superior postero-lateral aspect of the pedicular shadow (target area). After negative aspiration for blood, 253mof the nerve block solution was injected without difficulty or complication. The needle was removed intact. L5 Medial Branch Nerve Block (MBB): The target area for the L5 medial branch is at the junction of the postero-lateral aspect of the superior articular process and the superior, posterior, and medial edge of the sacral ala. Under fluoroscopic guidance, a Quincke needle was inserted until contact was made with os over the superior postero-lateral aspect of the pedicular shadow (target area). After negative aspiration for blood, 31m66mf the nerve block solution was injected without difficulty or complication. The needle was removed intact.   12 cc solution made of 10 cc of 0.2% ropivacaine, 2 cc of Decadron 10 mg/cc.  2 cc injected at each level above bilaterally.   Once the entire procedure was completed, the treated area was cleaned, making sure to leave some of the prepping solution back to take advantage of its long term bactericidal properties.         Illustration of the posterior view of the lumbar spine  and the posterior neural structures. Laminae of L2 through S1 are labeled. DPRL5, dorsal primary ramus of L5; DPRS1, dorsal primary ramus of S1; DPR3, dorsal primary ramus of L3; FJ, facet (zygapophyseal) joint L3-L4; I, inferior articular process of L4; LB1, lateral branch of dorsal primary ramus of L1; IAB, inferior articular branches from L3 medial branch (supplies  L4-L5 facet joint); IBP, intermediate branch plexus; MB3, medial branch of dorsal primary ramus of L3; NR3, third lumbar nerve root; S, superior articular process of L5; SAB, superior articular branches from L4 (supplies L4-5 facet joint also); TP3, transverse process of L3.  Vitals:   09/13/22 1056 09/13/22 1125 09/13/22 1135  BP: 112/67 131/82 133/80  Pulse: (!) 54 (!) 55 (!) 56  Resp: '16 16 16  '$ Temp: 97.6 F (36.4 C)    TempSrc: Temporal    SpO2: 97% 95% 96%  Weight: 200 lb (90.7 kg)    Height: '5\' 2"'$  (1.575 m)       Start Time: 1127 hrs. End Time: 1134 hrs.  Imaging Guidance (Spinal):          Type of Imaging Technique: Fluoroscopy Guidance (Spinal) Indication(s): Assistance in needle guidance and placement for procedures requiring needle placement in or near specific anatomical locations not easily accessible without such assistance. Exposure Time: Please see nurses notes. Contrast: None used. Fluoroscopic Guidance: I was personally present during the use of fluoroscopy. "Tunnel Vision Technique" used to obtain the best possible view of the target area. Parallax error corrected before commencing the procedure. "Direction-depth-direction" technique used to introduce the needle under continuous pulsed fluoroscopy. Once target was reached, antero-posterior, oblique, and lateral fluoroscopic projection used confirm needle placement in all planes. Images permanently stored in EMR. Interpretation: No contrast injected. I personally interpreted the imaging intraoperatively. Adequate needle placement confirmed in multiple planes.  Permanent images saved into the patient's record.  Antibiotic Prophylaxis:   Anti-infectives (From admission, onward)    None      Indication(s): None identified  Post-operative Assessment:  Post-procedure Vital Signs:  Pulse/HCG Rate:  (!) 56  Temp: 97.6 F (36.4 C) Resp: 16 BP: 133/80 SpO2: 96 %  EBL: None  Complications: No immediate post-treatment complications observed by team, or reported by patient.  Note: The patient tolerated the entire procedure well. A repeat set of vitals were taken after the procedure and the patient was kept under observation following institutional policy, for this type of procedure. Post-procedural neurological assessment was performed, showing return to baseline, prior to discharge. The patient was provided with post-procedure discharge instructions, including a section on how to identify potential problems. Should any problems arise concerning this procedure, the patient was given instructions to immediately contact us, at any time, without hesitation. In any case, we plan to contact the patient by telephone for a follow-up status report regarding this interventional procedure.  Comments:  No additional relevant information.  5 out of 5 strength bilateral lower extremity: Plantar flexion, dorsiflexion, knee flexion, knee extension.   Plan of Care  Orders:  Orders Placed This Encounter  Procedures   DG PAIN [facility] C-ARM 1-60 MIN NO REPORT    Intraoperative interpretation by procedural physician at [facility].    Standing Status:   Standing    Number of Occurrences:   1    Order Specific Question:   Reason for exam:    Answer:   Assistance in needle guidance and placement for procedures requiring needle placement in or near specific anatomical locations not easily accessible without such assistance.    Medications ordered for procedure: Meds ordered this encounter  Medications   lidocaine (XYLOCAINE) 2 % (with pres)  injection 400 mg   dexamethasone (DECADRON) injection 10 mg   dexamethasone (DECADRON) injection 10 mg   ropivacaine (PF) 2 mg/mL (0.2%) (NAROPIN) injection 9 mL   ropivacaine (PF) 2 mg/mL (0.2%) (  NAROPIN) injection 9 mL   Medications administered: We administered lidocaine, dexamethasone, dexamethasone, ropivacaine (PF) 2 mg/mL (0.2%), and ropivacaine (PF) 2 mg/mL (0.2%).  See the medical record for exact dosing, route, and time of administration.  Follow-up plan:   Return in about 4 weeks (around 10/11/2022) for PPE  .       Left L4/5 ESI #1 07/13/21, left piriformis, left sacroiliac joint injection 09/09/2021, 10/21/21, 11/25/21. B/L L3,4,5 MBNB #1 09/13/22     Recent Visits Date Type Provider Dept  08/19/22 Office Visit Gillis Santa, MD [facility]  Showing recent visits within past 90 days and meeting all other requirements Today's Visits Date Type Provider Dept  09/13/22 Procedure visit Gillis Santa, MD [facility]  Showing today's visits and meeting all other requirements Future Appointments Date Type Provider Dept  10/13/22 Appointment Gillis Santa, MD [facility]  Showing future appointments within next 90 days and meeting all other requirements  Disposition: Discharge home  Discharge (Date  Time): 09/13/2022; 1143 hrs.   Primary Care Physician: Baxter Hire, MD Location: [facility] Note by: Gillis Santa, MD Date: 09/13/2022; Time: 12:00 PM  Disclaimer:  Medicine is not an exact science. The only guarantee in medicine is that nothing is guaranteed. It is important to note that the decision to proceed with this intervention was based on the information collected from the patient. The Data and conclusions were drawn from the patient's questionnaire, the interview, and the physical examination. Because the information was provided in large part by the patient, it cannot be guaranteed that it has not been  purposely or unconsciously manipulated. Every effort has been made to obtain as much relevant data as possible for this evaluation. It is important to note that the conclusions that lead to this procedure are derived in large part from the available data. Always take into account that the treatment will also be dependent on availability of resources and existing treatment guidelines, considered by other Pain Management Practitioners as being common knowledge and practice, at the time of the intervention. For Medico-Legal purposes, it is also important to point out that variation in procedural techniques and pharmacological choices are the acceptable norm. The indications, contraindications, technique, and results of the above procedure should only be interpreted and judged by a Board-Certified Interventional Pain Specialist with extensive familiarity and expertise in the same exact procedure and technique.

## 2022-09-13 NOTE — Patient Instructions (Signed)

## 2022-09-14 ENCOUNTER — Telehealth: Payer: Self-pay

## 2022-09-14 NOTE — Telephone Encounter (Signed)
Post Procedure phone call.  LM

## 2022-09-28 ENCOUNTER — Other Ambulatory Visit: Payer: Self-pay | Admitting: Ophthalmology

## 2022-09-28 DIAGNOSIS — H5340 Unspecified visual field defects: Secondary | ICD-10-CM

## 2022-09-28 DIAGNOSIS — H538 Other visual disturbances: Secondary | ICD-10-CM

## 2022-10-04 ENCOUNTER — Other Ambulatory Visit (HOSPITAL_COMMUNITY): Payer: Self-pay | Admitting: Gastroenterology

## 2022-10-04 ENCOUNTER — Other Ambulatory Visit: Payer: Self-pay | Admitting: Gastroenterology

## 2022-10-04 DIAGNOSIS — R1310 Dysphagia, unspecified: Secondary | ICD-10-CM

## 2022-10-04 DIAGNOSIS — K219 Gastro-esophageal reflux disease without esophagitis: Secondary | ICD-10-CM

## 2022-10-05 ENCOUNTER — Ambulatory Visit
Admission: RE | Admit: 2022-10-05 | Discharge: 2022-10-05 | Disposition: A | Discharge status: Home / Self Care | Payer: Medicare Other | Source: Ambulatory Visit | Attending: Ophthalmology | Admitting: Ophthalmology

## 2022-10-05 DIAGNOSIS — H538 Other visual disturbances: Secondary | ICD-10-CM | POA: Insufficient documentation

## 2022-10-05 DIAGNOSIS — H5340 Unspecified visual field defects: Secondary | ICD-10-CM | POA: Insufficient documentation

## 2022-10-05 MED ORDER — GADOBUTROL 1 MMOL/ML IV SOLN
9.0000 mL | Freq: Once | INTRAVENOUS | Status: AC | PRN
  Administered 2022-10-05: 9 mL via INTRAVENOUS

## 2022-10-12 ENCOUNTER — Ambulatory Visit
Admission: RE | Admit: 2022-10-12 | Discharge: 2022-10-12 | Disposition: A | Discharge status: Home / Self Care | Payer: Medicare Other | Source: Ambulatory Visit | Attending: Gastroenterology | Admitting: Gastroenterology

## 2022-10-12 DIAGNOSIS — K219 Gastro-esophageal reflux disease without esophagitis: Secondary | ICD-10-CM | POA: Diagnosis present

## 2022-10-12 DIAGNOSIS — R1310 Dysphagia, unspecified: Secondary | ICD-10-CM | POA: Insufficient documentation

## 2022-10-13 ENCOUNTER — Encounter: Payer: Self-pay | Admitting: Student in an Organized Health Care Education/Training Program

## 2022-10-13 ENCOUNTER — Ambulatory Visit
Payer: Medicare Other | Attending: Student in an Organized Health Care Education/Training Program | Admitting: Student in an Organized Health Care Education/Training Program

## 2022-10-13 ENCOUNTER — Other Ambulatory Visit: Payer: Self-pay

## 2022-10-13 VITALS — BP 120/69 | HR 59 | Temp 97.4°F | Resp 18 | Ht 62.0 in | Wt 205.0 lb

## 2022-10-13 DIAGNOSIS — Z9689 Presence of other specified functional implants: Secondary | ICD-10-CM | POA: Diagnosis present

## 2022-10-13 DIAGNOSIS — M5416 Radiculopathy, lumbar region: Secondary | ICD-10-CM | POA: Diagnosis present

## 2022-10-13 DIAGNOSIS — M5124 Other intervertebral disc displacement, thoracic region: Secondary | ICD-10-CM | POA: Diagnosis present

## 2022-10-13 DIAGNOSIS — G8929 Other chronic pain: Secondary | ICD-10-CM | POA: Insufficient documentation

## 2022-10-13 DIAGNOSIS — M533 Sacrococcygeal disorders, not elsewhere classified: Secondary | ICD-10-CM | POA: Insufficient documentation

## 2022-10-13 DIAGNOSIS — M5417 Radiculopathy, lumbosacral region: Secondary | ICD-10-CM | POA: Diagnosis present

## 2022-10-13 DIAGNOSIS — G894 Chronic pain syndrome: Secondary | ICD-10-CM | POA: Diagnosis present

## 2022-10-13 DIAGNOSIS — M542 Cervicalgia: Secondary | ICD-10-CM | POA: Insufficient documentation

## 2022-10-13 DIAGNOSIS — M47816 Spondylosis without myelopathy or radiculopathy, lumbar region: Secondary | ICD-10-CM | POA: Insufficient documentation

## 2022-10-13 DIAGNOSIS — M5134 Other intervertebral disc degeneration, thoracic region: Secondary | ICD-10-CM | POA: Insufficient documentation

## 2022-10-13 DIAGNOSIS — M47818 Spondylosis without myelopathy or radiculopathy, sacral and sacrococcygeal region: Secondary | ICD-10-CM | POA: Diagnosis present

## 2022-10-13 DIAGNOSIS — M5412 Radiculopathy, cervical region: Secondary | ICD-10-CM | POA: Diagnosis present

## 2022-10-13 NOTE — Progress Notes (Signed)
Safety precautions to be maintained throughout the outpatient stay will include: orient to surroundings, keep bed in low position, maintain call bell within reach at all times, provide assistance with transfer out of bed and ambulation.  

## 2022-10-13 NOTE — Progress Notes (Signed)
PROVIDER NOTE: Information contained herein reflects review and annotations entered in association with encounter. Interpretation of such information and data should be left to medically-trained personnel. Information provided to patient can be located elsewhere in the medical record under "Patient Instructions". Document created using STT-dictation technology, any transcriptional errors that may result from process are unintentional.    Patient: Cassandra Brown  Service Category: E/M  Provider: Gillis Santa, MD  DOB: April 08, 1966  DOS: 10/13/2022  Referring Provider: Baxter Hire, MD  MRN: [id number]  Specialty: Interventional Pain Management  PCP: Baxter Hire, MD  Type: Established Patient  Setting: Ambulatory outpatient    Location: Office  Delivery: Face-to-face     HPI  [name], a 56 y.o. year old female, is here today because of her Lumbar radiculopathy, chronic [M54.16]. [name] primary complain today is Back Pain (low) Last encounter: My last encounter with her was on 09/13/2022. Pertinent problems: [name] has Chronic pain syndrome; Chronic sacroiliac joint pain; Lumbar radiculopathy, chronic; Spinal stenosis of lumbar region without neurogenic claudication; Status post total right knee replacement; Osteoarthritis of multiple joints; Lumbar facet arthropathy; SI joint arthritis; Chronic, continuous use of opioids; Lumbosacral radiculopathy at L5 (left); Protrusion of thoracic intervertebral disc; and Spinal cord stimulator status (Medtronic, implant: Dr Lacinda Axon) on their pertinent problem list. Pain Assessment: Severity of Chronic pain is reported as a 8 /10. Location: Back Lower/legs posteriorly a times. Onset: More than a month ago. Quality: Sharp, Constant. Timing: Constant. Modifying factor(s): medications. Vitals:  height is 5' 2" (1.575 m) and weight is 205 lb (93 kg). Her temporal temperature is 97.4 F (36.3 C) (abnormal). Her blood pressure is 120/69 and her  pulse is 59 (abnormal). Her respiration is 18 and oxygen saturation is 99%.   Reason for encounter: evaluation of worsening, or previously known (established) problem.  Also for post procedure assessment after lumbar facet medial branch nerve blocks.   Post-procedure evaluation   Type: Lumbar Facet, Medial Branch Block(s) #1  Laterality: Bilateral  Level: L3, L4, L5, Medial Branch Level(s). Injecting these levels blocks the L3-4 L4-5lumbar facet joints. Imaging: Fluoroscopic guidance         Anesthesia: Local anesthesia (1-2% Lidocaine) DOS: 09/13/2022 Performed by: Gillis Santa, MD  Primary Purpose: Diagnostic/Therapeutic Indications: Low back pain severe enough to impact quality of life or function. 1. Lumbar facet arthropathy   2. Lumbar spondylosis   3. Chronic pain syndrome    NAS-11 Pain score:   Pre-procedure: 8 /10   Post-procedure: 4  (able to bend and move better than when she came in)/10      Effectiveness:  Initial hour after procedure: 0 %  Subsequent 4-6 hours post-procedure: 70 %  Analgesia past initial 6 hours: 0 %  Ongoing improvement:  Analgesic:  0% Function: No benefit ROM: No benefit   ROS  Constitutional: Denies any fever or chills Gastrointestinal: No reported hemesis, hematochezia, vomiting, or acute GI distress Musculoskeletal:  Cervicalgia, worse with lateral rotation, thoracic pain, low back pain with radiation into bilateral legs and associated weakness Neurological: No reported episodes of acute onset apraxia, aphasia, dysarthria, agnosia, amnesia, paralysis, loss of coordination, or loss of consciousness  Medication Review  Centrum Performance, Dupilumab, EPINEPHrine, Galcanezumab-gnlm, HYDROcodone-acetaminophen, Rimegepant Sulfate, Tiotropium Bromide Monohydrate, albuterol, butalbital-acetaminophen-caffeine, carisoprodol, cholecalciferol, clobetasol cream, fluticasone-salmeterol, folic acid, furosemide, gentamicin ointment, methotrexate,  montelukast, naloxone, pantoprazole, pentoxifylline, pimecrolimus, pregabalin, promethazine, propranolol ER, rosuvastatin, sodium chloride, sotalol, sucralfate, and triamcinolone cream  History Review  Allergy: [name]  is allergic to cyclobenzaprine, ibuprofen, morphine, meperidine, sulfa antibiotics, and onion. Drug: [name]  reports no history of drug use. Alcohol:  reports that she does not currently use alcohol. Tobacco:  reports that she quit smoking about 11 years ago. Her smoking use included cigarettes. She has a 99.00 pack-year smoking history. She has never used smokeless tobacco. Social: [name]  reports that she quit smoking about 11 years ago. Her smoking use included cigarettes. She has a 99.00 pack-year smoking history. She has never used smokeless tobacco. She reports that she does not currently use alcohol. She reports that she does not use drugs. Medical:  has a past medical history of Allergic rhinitis due to pollen, Angina pectoris ([place]), Atrial fibrillation ([place]), CHF (congestive heart failure) ([place]), Chronic maxillary sinusitis, Chronic pain syndrome, Chronic right sacroiliac joint pain, Collagen vascular disease ([place]), Coronary artery abnormality, Dysrhythmia, Gastroparesis, GERD (gastroesophageal reflux disease), History of GI bleed, History of kidney stones, Leakage of Watchman left atrial appendage closure device, Leukocytosis, Obesity, Patent foramen ovale, Rheumatoid arthritis ([place]), Sleep apnea, Spinal stenosis of lumbar region, Unequal leg length, Vasculitis ([place]), and Wegener's disease, pulmonary. Surgical: [name]  has a past surgical history that includes Joint replacement (Right); Knee surgery (Left); Shoulder surgery (Bilateral); Appendectomy; Cholecystectomy; Thoracic laminectomy for spinal cord stimulator (Bilateral, 06/23/2020); Pulse generator implant (Left, 06/23/2020); Video bronchoscopy with endobronchial ultrasound (N/A, 02/27/2021); Video bronchoscopy  with endobronchial navigation (N/A, 02/27/2021); Nasal hemorrhage control (N/A, 05/13/2021); Back surgery; Colonoscopy (N/A, 04/30/2022); and Esophagogastroduodenoscopy (N/A, 04/30/2022). Family: family history is not on file.  Laboratory Chemistry Profile   Renal Lab Results  Component Value Date   BUN 11 01/12/2022   CREATININE 0.65 01/12/2022   GFRAA >60 06/19/2020   GFRNONAA >60 01/12/2022    Hepatic Lab Results  Component Value Date   AST 25 01/10/2022   ALT 25 01/10/2022   ALBUMIN 2.3 (L) 01/10/2022   ALKPHOS 47 01/10/2022   LIPASE 58 (H) 01/04/2022    Electrolytes Lab Results  Component Value Date   NA 144 01/12/2022   K 4.5 01/12/2022   CL 103 01/12/2022   CALCIUM 8.9 01/12/2022   MG 2.0 01/10/2022   PHOS 3.3 01/11/2022    Bone No results found for: "VD25OH", "VD125OH2TOT", "FT7322GU5", "KY7062BJ6", "25OHVITD1", "25OHVITD2", "25OHVITD3", "TESTOFREE", "TESTOSTERONE"  Inflammation (CRP: Acute Phase) (ESR: Chronic Phase) Lab Results  Component Value Date   CRP 6.2 (H) 01/12/2022   LATICACIDVEN 1.3 01/05/2022         Note: Above Lab results reviewed.  Recent Imaging Review  DG ESOPHAGUS W DOUBLE CM (HD) CLINICAL DATA:  Gastroesophageal reflux disease.  Dysphagia.  EXAM: ESOPHAGUS/BARIUM SWALLOW/TABLET STUDY  TECHNIQUE: A combined double and single contrast examination using effervescent crystals, high-density barium, and thin liquid barium was planned, however the examination was terminated prior to completion due to aspiration. This exam was performed by Reatha Armour, PA-C, and was supervised and interpreted by Dr. Logan Bores.  FLUOROSCOPY: Radiation Exposure Index (as provided by the fluoroscopic device): 8.10 mGy Kerma  COMPARISON:  None Available.  FINDINGS: Initial imaging of the pharynx and esophagus was performed in the AP and lateral projections with normal appearance of the pharynx. However, aspiration was observed during the second swallow  in the lateral projection and the examination was therefore terminated. The esophagus had not yet been studied in detail at this point, although the esophagus appeared grossly normal in caliber in the AP projection with barium passing freely into the stomach on the first swallow.  IMPRESSION: Incomplete examination due to aspiration.  Electronically Signed   By: Logan Bores M.D.   On: 10/12/2022 09:55  CLINICAL DATA:  Mid to low back pain radiating into the left buttock for 1 month. No known injury. History of conditional spinal cord stimulator.   EXAM: MRI THORACIC AND LUMBAR SPINE WITHOUT CONTRAST   TECHNIQUE: Multiplanar and multiecho pulse sequences of the thoracic and lumbar spine were obtained without intravenous contrast. There was power outage during the lumbar spine portion of this examination, and the sagittal T1 weighted sequence is incomplete.   COMPARISON:  Chest CT 01/21/2021. Thoracic MRI 02/25/2020 and lumbar MRI 07/31/2019.   FINDINGS: MRI THORACIC SPINE FINDINGS   Alignment:  Physiologic.   Vertebrae: No acute or suspicious osseous findings.   Cord: The thoracic cord appears normal in signal and caliber. Artifact from the thoracic spinal stimulator is noted inferiorly.   Paraspinal and other soft tissues: No significant paraspinal findings. Cavitary lung disease seen on prior chest CT appears mildly improved, although is incompletely visualized and suboptimally evaluated.   Disc levels:   No significant disc space findings at or above T5-6.   T6-7: Stable small left paracentral disc protrusion without mass effect on the cord.   T7-8: Stable mild disc bulging.   T8-9: Normal interspace.   T9-10: Persistent moderate sized left paracentral disc extrusion with mild mass effect on the cord. Stable mild foraminal narrowing bilaterally.   Stable mild disc bulging from T10-11 through T12-L1 without associated spinal stenosis.   MRI LUMBAR  SPINE FINDINGS   Segmentation: Conventional anatomy assumed, with the last open disc space designated L5-S1.Concordant with previous imaging.   Alignment:  Stable mild convex left scoliosis centered at L3.   Vertebrae: No worrisome osseous lesion, acute fracture or pars defect. Asymmetric endplate degenerative changes on the right at L3-4.   Conus medullaris: Extends to the L2 level and appears normal.   Paraspinal and other soft tissues: No significant paraspinal findings.   Disc levels:   L1-2: Stable loss of disc height with annular disc bulging and endplate osteophytes. No significant spinal stenosis or nerve root encroachment.   L2-3: Stable annular disc bulging with endplate osteophytes and mild bilateral facet hypertrophy. Stable mild right-greater-than-left foraminal narrowing.   L3-4: Loss of disc height with annular disc bulging and endplate osteophytes asymmetric to the right. Mild facet and ligamentous hypertrophy. The spinal canal and lateral recesses are patent. Stable moderate right foraminal narrowing.   L4-5: Disc height and hydration are maintained. Mild bilateral facet hypertrophy. No significant spinal stenosis or nerve root encroachment.   L5-S1: Stable mild disc bulging eccentric to the left with asymmetric left-sided facet hypertrophy. Stable chronic moderate left foraminal narrowing.   IMPRESSION: 1. Interval thoracic spinal stimulator placement. 2. Stable thoracic disc protrusions, largest on the left at T9-10 with mild mass effect on the cord. 3. Stable multilevel lumbar spondylosis with chronic moderate left foraminal narrowing at L5-S1, potentially symptomatic. There is moderate right-sided foraminal narrowing at L3-4. 4. No acute findings are evident. 5. The previously demonstrated cavitary lung disease appears improved, although is suboptimally evaluated by this study. Correlate with prior biopsy results.     Electronically Signed    By: Richardean Sale M.D.   On: 07/06/2021 11:57   Note: Reviewed        Physical Exam  General appearance: Well nourished, well developed, and well hydrated. In no apparent acute distress Mental status: Alert, oriented x 3 (person, place, & time)  Respiratory: No evidence of acute respiratory distress Eyes: PERLA Vitals: BP 120/69   Pulse (!) 59   Temp (!) 97.4 F (36.3 C) (Temporal)   Resp 18   Ht 5' 2" (1.575 m)   Wt 205 lb (93 kg)   SpO2 99%   BMI 37.49 kg/m  BMI: Estimated body mass index is 37.49 kg/m as calculated from the following:   Height as of this encounter: 5' 2" (1.575 m).   Weight as of this encounter: 205 lb (93 kg). Ideal: Ideal body weight: 50.1 kg (110 lb 7.2 oz) Adjusted ideal body weight: 67.3 kg (148 lb 4.3 oz)  Cervical Spine Area Exam  Skin & Axial Inspection: No masses, redness, edema, swelling, or associated skin lesions Alignment: Symmetrical Functional ROM: Pain restricted ROM, bilaterally Stability: No instability detected Muscle Tone/Strength: Functionally intact. No obvious neuro-muscular anomalies detected. Sensory (Neurological): Musculoskeletal pain pattern Palpation: No palpable anomalies             Upper Extremity (UE) Exam    Side: Right upper extremity  Side: Left upper extremity  Skin & Extremity Inspection: Skin color, temperature, and hair growth are WNL. No peripheral edema or cyanosis. No masses, redness, swelling, asymmetry, or associated skin lesions. No contractures.  Skin & Extremity Inspection: Skin color, temperature, and hair growth are WNL. No peripheral edema or cyanosis. No masses, redness, swelling, asymmetry, or associated skin lesions. No contractures.  Functional ROM: Unrestricted ROM          Functional ROM: Unrestricted ROM          Muscle Tone/Strength: Functionally intact. No obvious neuro-muscular anomalies detected.  Muscle Tone/Strength: Functionally intact. No obvious neuro-muscular anomalies detected.   Sensory (Neurological): Unimpaired          Sensory (Neurological): Unimpaired          Palpation: No palpable anomalies              Palpation: No palpable anomalies              Provocative Test(s):  Phalen's test: deferred Tinel's test: deferred Apley's scratch test (touch opposite shoulder):  Action 1 (Across chest): deferred Action 2 (Overhead): deferred Action 3 (LB reach): deferred   Provocative Test(s):  Phalen's test: deferred Tinel's test: deferred Apley's scratch test (touch opposite shoulder):  Action 1 (Across chest): deferred Action 2 (Overhead): deferred Action 3 (LB reach): deferred    Thoracic Spine Area Exam  Skin & Axial Inspection: No masses, redness, or swelling Alignment: Symmetrical Functional ROM: Pain restricted ROM Stability: No instability detected Muscle Tone/Strength: Functionally intact. No obvious neuro-muscular anomalies detected. Sensory (Neurological): Neurogenic pain pattern Muscle strength & Tone: No palpable anomalies Lumbar Spine Area Exam  Skin & Axial Inspection: Well healed scar from previous spine surgery detected spinal cord stimulator present Alignment: Scoliosis detected Functional ROM: Pain restricted ROM       Stability: No instability detected Muscle Tone/Strength: Functionally intact. No obvious neuro-muscular anomalies detected. Sensory (Neurological): Dermatomal pain pattern and MSK  Lower Extremity Exam    Side: Right lower extremity  Side: Left lower extremity  Stability: No instability observed          Stability: No instability observed          Skin & Extremity Inspection: Skin color, temperature, and hair growth are WNL. No peripheral edema or cyanosis. No masses, redness, swelling, asymmetry, or associated skin lesions. No contractures.  Skin & Extremity Inspection: Skin color, temperature, and hair growth are WNL.  No peripheral edema or cyanosis. No masses, redness, swelling, asymmetry, or associated skin lesions. No  contractures.  Functional ROM: Pain restricted ROM for hip and knee joints          Functional ROM: Pain restricted ROM for hip and knee joints          Muscle Tone/Strength: Functionally intact. No obvious neuro-muscular anomalies detected.  Muscle Tone/Strength: Functionally intact. No obvious neuro-muscular anomalies detected.  Sensory (Neurological): Neurogenic pain pattern        Sensory (Neurological): Neurogenic pain pattern        DTR: Patellar: deferred today Achilles: deferred today Plantar: deferred today  DTR: Patellar: deferred today Achilles: deferred today Plantar: deferred today  Palpation: No palpable anomalies  Palpation: No palpable anomalies    Assessment   Diagnosis Status  1. Lumbar radiculopathy, chronic (LEFT)    2. Lumbosacral radiculopathy at L5 (left)   3. Protrusion of thoracic intervertebral disc   4. Chronic radicular cervical pain   5. Lumbar facet arthropathy   6. Lumbar spondylosis   7. Chronic pain syndrome   8. Spinal cord stimulator status (Medtronic, implant: Dr Lacinda Axon)   9. Chronic sacroiliac joint pain   10. SI joint arthritis   11. Other intervertebral disc degeneration, thoracic region   12. Cervicalgia    Worsening Worsening Persistent   Updated Problems: Problem  Lumbosacral radiculopathy at L5 (left)  Protrusion of Thoracic Intervertebral Disc  Spinal cord stimulator status (Medtronic, implant: Dr Lacinda Axon)  Chronic, Continuous Use of Opioids  Si Joint Arthritis  Status Post Total Right Knee Replacement  Chronic Sacroiliac Joint Pain  Osteoarthritis of Multiple Joints  Lumbar Radiculopathy, Chronic  Spinal Stenosis of Lumbar Region Without Neurogenic Claudication  Lumbar Facet Arthropathy     Plan of Care  Unfortunately, no benefit with lumbar facet medial branch nerve block.  Patient states that her pain somewhat increased after the procedure.  She states that she is really struggling with her low back pain with occasional  radiation into bilateral legs.  She has tried physical therapy, lumbar epidural steroid injections, SI joint injection, lumbar facet medial branch nerve block as well as spinal cord stimulator implant without any benefit.  At this point I have limited options for her.  Previous thoracic MRI does show a thoracic disc herniation with mass effect on the cord.  This could have progressed and could be causing additional pain and symptoms for the patient.  She is also complaining of cervicalgia and audible cracking when she turns her head.  MRI of C-spine, T-spine and L-spine below.  Results of this will guide treatment.  She may need to see neurosurgery depending upon results.  I explained to her that I have limited interventional options for her.   Orders:  Orders Placed This Encounter  Procedures   MR LUMBAR SPINE WO CONTRAST    Patient presents with axial pain with possible radicular component. Please assist [place] in identifying specific level(s) and laterality of any additional findings such as: 1. Facet (Zygapophyseal) joint DJD (Hypertrophy, space narrowing, subchondral sclerosis, and/or osteophyte formation) 2. DDD and/or IVDD (Loss of disc height, desiccation, gas patterns, osteophytes, endplate sclerosis, or "Black disc disease") 3. Pars defects 4. Spondylolisthesis, spondylosis, and/or spondyloarthropathies (include Degree/Grade of displacement in mm) (stability) 5. Vertebral body Fractures (acute/chronic) (state percentage of collapse) 6. Demineralization (osteopenia/osteoporotic) 7. Bone pathology 8. Foraminal narrowing  9. Surgical changes 10. Central, Lateral Recess, and/or Foraminal Stenosis (include AP diameter of stenosis in mm) 11. Surgical  changes (hardware type, status, and presence of fibrosis) 12. Modic Type Changes (MRI only) 13. IVDD (Disc bulge, protrusion, herniation, extrusion) (Level, laterality, extent)    Standing Status:   Future    Standing Expiration Date:   11/13/2022     Scheduling Instructions:     Imaging must be done as soon as possible. Inform patient that order will expire within 30 days and I will not renew it.    Order Specific Question:   What is the patient's sedation requirement?    Answer:   No Sedation    Order Specific Question:   Does the patient have a pacemaker or implanted devices?    Answer:   No    Order Specific Question:   Preferred imaging location?    Answer:   [facility] (table limit-350lbs)    Order Specific Question:   Call Results- Best Contact Number?    Answer:   [phone] ([facility])    Order Specific Question:   Radiology Contrast Protocol - do NOT remove file path    Answer:   _0 [email or web address]   MR THORACIC SPINE WO CONTRAST    Patient presents with axial pain with possible radicular component. Please assist [place] in identifying specific level(s) and laterality of any additional findings such as: 1. Facet (Zygapophyseal) joint DJD (Hypertrophy, space narrowing, subchondral sclerosis, and/or osteophyte formation) 2. DDD and/or IVDD (Loss of disc height, desiccation, gas patterns, osteophytes, endplate sclerosis, or "Black disc disease") 3. Pars defects 4. Spondylolisthesis, spondylosis, and/or spondyloarthropathies (include Degree/Grade of displacement in mm) (stability) 5. Vertebral body Fractures (acute/chronic) (state percentage of collapse) 6. Demineralization (osteopenia/osteoporotic) 7. Bone pathology 8. Foraminal narrowing  9. Surgical changes 10. Central, Lateral Recess, and/or Foraminal Stenosis (include AP diameter of stenosis in mm) 11. Surgical changes (hardware type, status, and presence of fibrosis) 12. Modic Type Changes (MRI only) 13. IVDD (Disc bulge, protrusion, herniation, extrusion) (Level, laterality, extent)    Standing Status:   Future    Standing Expiration Date:   11/13/2022    Scheduling Instructions:     Imaging must be done as soon as possible.  Inform patient that order will expire within 30 days and I will not renew it.    Order Specific Question:   What is the patient's sedation requirement?    Answer:   No Sedation    Order Specific Question:   Does the patient have a pacemaker or implanted devices?    Answer:   No    Order Specific Question:   Preferred imaging location?    Answer:   [facility] (table limit-350lbs)    Order Specific Question:   Call Results- Best Contact Number?    Answer:   [phone] ([facility])    Order Specific Question:   Radiology Contrast Protocol - do NOT remove file path    Answer:   _1 [email or web address]   MR CERVICAL SPINE WO CONTRAST    Standing Status:   Future    Standing Expiration Date:   10/14/2023    Order Specific Question:   What is the patient's sedation requirement?    Answer:   No Sedation    Order Specific Question:   Does the patient have a pacemaker or implanted devices?    Answer:   No    Order Specific Question:   Preferred imaging location?    Answer:   [facility] (table limit-350lbs)   Follow-up plan:   Return for patient will call to make F2F appt  after she has done her MRIs.     Left L4/5 ESI #1 07/13/21, left piriformis, left sacroiliac joint injection 09/09/2021, 10/21/21, 11/25/21. B/L L3,4,5 MBNB #1 09/13/22      Recent Visits Date Type Provider Dept  09/13/22 Procedure visit Gillis Santa, MD [facility]  08/19/22 Office Visit Gillis Santa, MD [facility]  Showing recent visits within past 90 days and meeting all other requirements Today's Visits Date Type Provider Dept  10/13/22 Office Visit Gillis Santa, MD [facility]  Showing today's visits and meeting all other requirements Future Appointments No visits were found meeting these conditions. Showing future appointments within next 90 days and meeting all other requirements  I discussed the assessment and treatment plan  with the patient. The patient was provided an opportunity to ask questions and all were answered. The patient agreed with the plan and demonstrated an understanding of the instructions.  Patient advised to call back or seek an in-person evaluation if the symptoms or condition worsens.  Duration of encounter: 71mnutes.  Total time on encounter, as per AMA guidelines included both the face-to-face and non-face-to-face time personally spent by the physician and/or other qualified health care professional(s) on the day of the encounter (includes time in activities that require the physician or other qualified health care professional and does not include time in activities normally performed by clinical staff). Physician's time may include the following activities when performed: preparing to see the patient (eg, review of tests, pre-charting review of records) obtaining and/or reviewing separately obtained history performing a medically appropriate examination and/or evaluation counseling and educating the patient/family/caregiver ordering medications, tests, or procedures referring and communicating with other health care professionals (when not separately reported) documenting clinical information in the electronic or other health record independently interpreting results (not separately reported) and communicating results to the patient/ family/caregiver care coordination (not separately reported)  Note by: BGillis Santa MD Date: 10/13/2022; Time: 3:27 PM

## 2022-10-19 ENCOUNTER — Other Ambulatory Visit: Payer: Self-pay | Admitting: Gastroenterology

## 2022-10-19 DIAGNOSIS — R1310 Dysphagia, unspecified: Secondary | ICD-10-CM

## 2022-10-19 DIAGNOSIS — R933 Abnormal findings on diagnostic imaging of other parts of digestive tract: Secondary | ICD-10-CM

## 2022-10-22 ENCOUNTER — Ambulatory Visit: Payer: Medicare Other

## 2022-11-15 ENCOUNTER — Ambulatory Visit
Admission: RE | Admit: 2022-11-15 | Discharge: 2022-11-15 | Disposition: A | Discharge status: Home / Self Care | Payer: Medicare Other | Source: Ambulatory Visit | Attending: Gastroenterology | Admitting: Gastroenterology

## 2022-11-15 DIAGNOSIS — R933 Abnormal findings on diagnostic imaging of other parts of digestive tract: Secondary | ICD-10-CM | POA: Diagnosis present

## 2022-11-15 DIAGNOSIS — R1310 Dysphagia, unspecified: Secondary | ICD-10-CM | POA: Diagnosis present

## 2022-11-15 NOTE — Therapy (Signed)
[facility] [place], [place], [address] Phone: [phone]   Fax:     Modified Barium Swallow  Patient Details  Name: Cassandra Brown MRN: [id number] Date of Birth: 07-Oct-1966 No data recorded  Encounter Date: 11/15/2022   End of Session - 11/15/22 1853     Visit Number 1    Number of Visits 1    Date for SLP Re-Evaluation 11/15/22    SLP Start Time 1300    SLP Stop Time  1330    SLP Time Calculation (min) 30 min    Activity Tolerance Patient tolerated treatment well             Past Medical History:  Diagnosis Date   Allergic rhinitis due to pollen    Angina pectoris ([place])    Atrial fibrillation ([place])    CHF (congestive heart failure) (H[facility])    Chronic maxillary sinusitis    Chronic pain syndrome    BACK   Chronic right sacroiliac joint pain    Collagen vascular disease ([place])    Coronary artery abnormality    Dysrhythmia    AFIB,  FREQ PVC   Gastroparesis    GERD (gastroesophageal reflux disease)    History of GI bleed    History of kidney stones    Leakage of Watchman left atrial appendage closure device    Leukocytosis    Obesity    Patent foramen ovale    Rheumatoid arthritis ([place])    OSTEOARTHRITIS   Sleep apnea    Spinal stenosis of lumbar region    Unequal leg length    Vasculitis ([place])    Wegener's disease, pulmonary     Past Surgical History:  Procedure Laterality Date   APPENDECTOMY     BACK SURGERY     CHOLECYSTECTOMY     COLONOSCOPY N/A 04/30/2022   Procedure: COLONOSCOPY;  Surgeon: [name], DO;  Location: [facility] ENDOSCOPY;  Service: Gastroenterology;  Laterality: N/A;   ESOPHAGOGASTRODUODENOSCOPY N/A 04/30/2022   Procedure: ESOPHAGOGASTRODUODENOSCOPY (EGD);  Surgeon: [name], DO;  Location: [facility] ENDOSCOPY;  Service: Gastroenterology;  Laterality: N/A;   JOINT REPLACEMENT Right    knee   KNEE SURGERY Left    NASAL HEMORRHAGE CONTROL N/A  05/13/2021   Procedure: EPISTAXIS CONTROL;  Surgeon: Carloyn Manner, MD;  Location: [facility] ORS;  Service: ENT;  Laterality: N/A;   PULSE GENERATOR IMPLANT Left 06/23/2020   Procedure: LEFT FLANK PULSE GENERATOR IMPLANT;  Surgeon: Deetta Perla, MD;  Location: [facility] ORS;  Service: Neurosurgery;  Laterality: Left;   SHOULDER SURGERY Bilateral    THORACIC LAMINECTOMY FOR SPINAL CORD STIMULATOR Bilateral 06/23/2020   Procedure: THORACIC SPINAL CORD STIMULATOR;  Surgeon: Deetta Perla, MD;  Location: [facility] ORS;  Service: Neurosurgery;  Laterality: Bilateral;   VIDEO BRONCHOSCOPY WITH ENDOBRONCHIAL NAVIGATION N/A 02/27/2021   Procedure: VIDEO BRONCHOSCOPY WITH ENDOBRONCHIAL NAVIGATION;  Surgeon: Ottie Glazier, MD;  Location: [facility] ORS;  Service: Thoracic;  Laterality: N/A;   VIDEO BRONCHOSCOPY WITH ENDOBRONCHIAL ULTRASOUND N/A 02/27/2021   Procedure: VIDEO BRONCHOSCOPY WITH ENDOBRONCHIAL ULTRASOUND;  Surgeon: Ottie Glazier, MD;  Location: [facility] ORS;  Service: Thoracic;  Laterality: N/A;    There were no vitals filed for this visit.    11/15/22 1800  SLP Visit Information  SLP Received On 11/15/22  Subjective  Subjective Aspirated on a barium swallow on 10/12/22  Patient/Family Stated Goal figure out why she's having trouble swallowing  Pain Assessment  Pain Assessment No/denies pain  General Information  Date of Onset 10/12/22  HPI Ileigh Brown is a 56 y.o. female who presents today for MBS, having just recently had an instance of aspiration during a barium swallow on 10/12/22, which was terminated. She reports dysphagia to solids and liquids, and endorses sensation mid-sternum of globus, foods sticking. PMHx also includes Rheumatoid arthritis, asthma, CHF, DVT, GERD, chronic nosebleeds.  Type of Study MBS-Modified Barium Swallow Study  Previous Swallow Assessment see Subjective  Diet Prior to this Study Regular;Thin liquids  Temperature Spikes Noted No  Respiratory Status Room air   History of Recent Intubation No  Behavior/Cognition Alert;Cooperative;Pleasant mood  Oral Cavity Assessment WFL  Oral Care Completed by SLP No  Oral Cavity - Dentition Dentures, top;Dentures, bottom  Vision Functional for self feeding  Self-Feeding Abilities Able to feed self  Patient Positioning Upright in chair  Baseline Vocal Quality Normal  Volitional Cough Strong  Volitional Swallow Able to elicit  Anatomy WFL (appearance of thickened tissue in the hypopharynx just above prominent cricopharyngeus (anterior and posterior))  Pharyngeal Secretions Not observed secondary MBS  Oral Motor/Sensory Function  Overall Oral Motor/Sensory Function WFL  Oral Preparation/Oral Phase  Oral Phase WFL  Pharyngeal Phase  Pharyngeal Phase WFL  Cervical Esophageal Phase  Cervical Esophageal Phase Impaired  Cervical Esophageal Phase - Comment  Cervical Esophageal Comment prominent cricopharyngeus, mild retrograde flow with liquids  Other Esophageal Phase Observations thickened tissue just above UES; see image stills  Clinical Impression  Clinical Impression Patient presents with functional oropharyngeal swallowing. Oral stage was characterized by adequate bolus containment, bolus formation, and anterior-to-posterior transit, and rotary mastication. Occasional holding as pt appeared to anticipate difficulty with larger sips or when she disliked taste/texture. Swallow initiation timely at the level of the base of tongue. Pharyngeal phase is characterized by adequate tongue base retraction, hyolarygneal excursion, and pharyngeal constriction. Epiglottic deflection is complete. There is no penetration or aspiration. Cervical esophageal phase is noted for appearance of thickened tissue of the hypopharynx just above a fingerlike protrusion consistent with appearance of prominent cricopharyngeus. This was not noted to obstruct flow with any significance, however intermittent retrograde flow from the cervical  esophagus to the hypopharynx was noted with thin and nectar thick liquid textures. Following presentation of solids, pt exhibited throat clearing and reported globus sensation, however imaging did not reveal any pharyngeal residue, penetration or aspiration. An esophageal sweep performed in anterior-posterior view was unremarkable. Patient reported "using caution" and going slowly today to avoid aspirating. She states she notices if she eats or drinks too quickly this is when she has symptoms. Suspect this is esophageal in nature. Reinforced reflux modifications as well as general precautions such as slow rate, small bites and sips. Recommend further evaluation of cricopharyngeal function; ENT consult for direct visualization of hypopharynx may be appropriate if this is not adequately viewed during her upcoming EGD.  SLP Visit Diagnosis Dysphagia, unspecified (R13.10)  Impact on safety and function Mild aspiration risk (hx GERD)  Swallow Evaluation Recommendations  Recommended Consults Consider ENT evaluation;Consider esophageal assessment  SLP Diet Recommendations Regular solids;Thin liquid  Liquid Administration via Cup  Medication Administration Whole meds with liquid  Supervision Patient able to self feed  Compensations Slow rate;Small sips/bites;Follow solids with liquid  Postural Changes Remain semi-upright after after feeds/meals (Comment);Seated upright at 90 degrees  Treatment Plan  Oral Care Recommendations Oral care BID  Treatment Recommendations No treatment recommended at this time  Follow Up Recommendations No SLP follow up  Prognosis  Prognosis for  Safe Diet Advancement Good  Individuals Consulted  Consulted and Agree with Results and Recommendations Patient  Report Sent to  Referring physician  Progression Toward Goals  Progression toward goals Goals met, education completed, patient discharged from SLP  SLP Evaluations  $ SLP Speech Visit 1 Visit  SLP Evaluations   $Outpatient MBS Swallow 1 Procedure    Dysphagia, unspecified type - Plan: DG SWALLOW FUNC OP MEDICARE SPEECH PATH, DG SWALLOW FUNC OP MEDICARE SPEECH PATH  Abnormal barium swallow - Plan: DG SWALLOW FUNC OP MEDICARE SPEECH PATH, DG SWALLOW FUNC OP MEDICARE SPEECH PATH        Problem List Patient Active Problem List   Diagnosis Date Noted   Lumbosacral radiculopathy at L5 (left) 10/13/2022   Protrusion of thoracic intervertebral disc 10/13/2022   Spinal cord stimulator status ([employer], implant: Dr Lacinda Axon) 10/13/2022   Positive D dimer 01/09/2022   Deep vein thrombosis (DVT) of left lower extremity ([place]) 01/09/2022   Chronic, continuous use of opioids 01/08/2022   Hypophosphatemia 01/07/2022   Hypokalemia 01/07/2022   Acute respiratory failure with hypoxia ([place]) 01/06/2022   Sepsis ([place]) 01/06/2022   Epigastric abdominal pain 01/06/2022   Pneumonia due to COVID-19 virus 01/05/2022   SI joint arthritis 08/24/2021   Piriformis muscle pain 08/24/2021   Epistaxis 05/13/2021   Bleeding nose 05/13/2021   Leukocytosis 05/13/2021   Chronic diastolic CHF (congestive heart failure) ([place]) 05/13/2021   Normocytic anemia 05/13/2021   Chronic radicular lumbar pain 01/29/2020   Status post total right knee replacement 01/20/2020   Frequent PVCs 01/20/2020   Chronic sacroiliac joint pain 02/13/2019   Atrial fibrillation, chronic ([place]) 01/01/2019   Asthma 01/01/2019   Coronary artery disease involving native heart with angina pectoris ([place]) 01/01/2019   Heart failure, unspecified ([place]) 01/01/2019   Obstructive sleep apnea 01/01/2019   Rheumatoid arthritis ([place]) 01/01/2019   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[place]) 10/10/2018   Osteoarthritis of multiple joints 09/13/2018   Lumbar radiculopathy, chronic 01/16/2018   Spinal stenosis of lumbar region without neurogenic claudication 01/16/2018   Right hip pain 01/16/2018   Patent foramen ovale 08/17/2017    Chronic pain syndrome 01/20/2017   Lumbar facet arthropathy 01/20/2017   Deneise Lever, MS, CCC-SLP Speech-Language Pathologist [phone]  Aliene Altes, [place] 11/15/2022, 7:25 PM  [facility] [place], [place], [address] Phone: [phone]   Fax:     Name: BRIYANA BADMAN MRN: [id number] Date of Birth: Oct 05, 1966

## 2022-12-02 ENCOUNTER — Ambulatory Visit
Admission: RE | Admit: 2022-12-02 | Discharge: 2022-12-02 | Disposition: A | Discharge status: Home / Self Care | Payer: Medicare Other | Source: Ambulatory Visit | Attending: Student in an Organized Health Care Education/Training Program | Admitting: Student in an Organized Health Care Education/Training Program

## 2022-12-02 DIAGNOSIS — M542 Cervicalgia: Secondary | ICD-10-CM | POA: Insufficient documentation

## 2022-12-02 DIAGNOSIS — G8929 Other chronic pain: Secondary | ICD-10-CM | POA: Insufficient documentation

## 2022-12-02 DIAGNOSIS — M5412 Radiculopathy, cervical region: Secondary | ICD-10-CM | POA: Insufficient documentation

## 2022-12-02 DIAGNOSIS — G894 Chronic pain syndrome: Secondary | ICD-10-CM | POA: Insufficient documentation

## 2023-01-10 ENCOUNTER — Encounter: Payer: Self-pay | Admitting: Student in an Organized Health Care Education/Training Program

## 2023-01-10 ENCOUNTER — Ambulatory Visit
Payer: Medicare Other | Attending: Student in an Organized Health Care Education/Training Program | Admitting: Student in an Organized Health Care Education/Training Program

## 2023-01-10 VITALS — BP 124/64 | HR 62 | Temp 97.3°F | Resp 16 | Ht 62.0 in | Wt 200.0 lb

## 2023-01-10 DIAGNOSIS — M47812 Spondylosis without myelopathy or radiculopathy, cervical region: Secondary | ICD-10-CM | POA: Diagnosis present

## 2023-01-10 DIAGNOSIS — G894 Chronic pain syndrome: Secondary | ICD-10-CM | POA: Insufficient documentation

## 2023-01-10 DIAGNOSIS — M533 Sacrococcygeal disorders, not elsewhere classified: Secondary | ICD-10-CM | POA: Diagnosis not present

## 2023-01-10 DIAGNOSIS — M47818 Spondylosis without myelopathy or radiculopathy, sacral and sacrococcygeal region: Secondary | ICD-10-CM | POA: Insufficient documentation

## 2023-01-10 DIAGNOSIS — M7918 Myalgia, other site: Secondary | ICD-10-CM | POA: Diagnosis present

## 2023-01-10 DIAGNOSIS — Z9689 Presence of other specified functional implants: Secondary | ICD-10-CM | POA: Insufficient documentation

## 2023-01-10 DIAGNOSIS — G8929 Other chronic pain: Secondary | ICD-10-CM | POA: Insufficient documentation

## 2023-01-10 DIAGNOSIS — M5030 Other cervical disc degeneration, unspecified cervical region: Secondary | ICD-10-CM | POA: Diagnosis present

## 2023-01-10 NOTE — Progress Notes (Signed)
Safety precautions to be maintained throughout the outpatient stay will include: orient to surroundings, keep bed in low position, maintain call bell within reach at all times, provide assistance with transfer out of bed and ambulation.  

## 2023-01-10 NOTE — Progress Notes (Signed)
PROVIDER NOTE: Information contained herein reflects review and annotations entered in association with encounter. Interpretation of such information and data should be left to medically-trained personnel. Information provided to patient can be located elsewhere in the medical record under "Patient Instructions". Document created using STT-dictation technology, any transcriptional errors that may result from process are unintentional.    Patient: Cassandra Brown  Service Category: E/M  Provider: Gillis Santa, MD  DOB: [birth year]  DOS: 01/10/2023  Referring Provider: Baxter Hire, MD  MRN: [id number]  Specialty: Interventional Pain Management  PCP: Baxter Hire, MD  Type: Established Patient  Setting: Ambulatory outpatient    Location: Office  Delivery: Face-to-face     HPI  Cassandra Brown, a 57 y.o. 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 Cassandra Brown primary complain today is Back Pain (Lumbar bilateral ) and Neck Pain (Bilateral ) Last encounter: My last encounter with her was on 10/13/2022. Pertinent problems: Cassandra Brown has Chronic pain syndrome; Chronic sacroiliac joint pain; Lumbar radiculopathy, chronic; Spinal stenosis of lumbar region without neurogenic claudication; Status post total right knee replacement; Osteoarthritis of multiple joints; Lumbar facet arthropathy; SI joint arthritis; Chronic, continuous use of opioids; Lumbosacral radiculopathy at L5 (left); Protrusion of thoracic intervertebral disc; and Spinal cord stimulator status ([employer], implant: Dr Lacinda Axon) on their pertinent problem list. Pain Assessment: Severity of Chronic pain is reported as a 8 /10. Location: Back (neck) Lower, Left, Right/denies. Onset: More than a month ago. Quality: Discomfort, Constant, Sharp, Nagging. Timing: Constant. Modifying factor(s): nothing currently. Vitals:  height is '5\' 2"'$  (1.575 m) and weight is 200 lb (90.7 kg). Her temporal temperature  is 97.3 F (36.3 C) (abnormal). Her blood pressure is 124/64 and her pulse is 62. Her respiration is 16 and oxygen saturation is 98%.  BMI: Estimated body mass index is 36.58 kg/m as calculated from the following:   Height as of this encounter: '5\' 2"'$  (1.575 m).   Weight as of this encounter: 200 lb (90.7 kg).  Reason for encounter: follow-up evaluation to review cervical MRI and also discuss low back, bilateral buttock, SI joint and piriformis pain.  Cassandra Brown presents today to review her cervical MRI.  She endorses cervical spine pain, cervicalgia likely secondary to cervical facet arthropathy and associated cervical facet joint syndrome.  See MRI results below.  She is also endorsing bilateral buttock pain overlying her SI joint and piriformis.  She had a left SI joint and left piriformis injection done 10/21/2021 as well as 11/25/2021 which provided her with 75% pain relief for over 6 months.  Given return of pain in this region, she would like to repeat injections into her SI joint and piriformis but do them bilaterally as her pain is on both sides now.  She has a spinal cord stimulator in place but states that it has become less effective.  I encouraged her to reach out to Manuela Schwartz with [employer] to see if they can optimize programming.   ROS  Constitutional: Denies any fever or chills Gastrointestinal: No reported hemesis, hematochezia, vomiting, or acute GI distress Musculoskeletal:  Bilateral buttock, SI joint, piriformis pain, cervicalgia Neurological: No reported episodes of acute onset apraxia, aphasia, dysarthria, agnosia, amnesia, paralysis, loss of coordination, or loss of consciousness  Medication Review  Centrum Performance, Dupilumab, EPINEPHrine, Galcanezumab-gnlm, HYDROcodone-acetaminophen, Rimegepant Sulfate, Tiotropium Bromide Monohydrate, albuterol, butalbital-acetaminophen-caffeine, carisoprodol, cholecalciferol, clobetasol cream, ferrous sulfate, fluticasone-salmeterol,  folic acid, furosemide, gentamicin ointment, methotrexate, montelukast, naloxone, pantoprazole, pentoxifylline, pimecrolimus,  polyethylene glycol-electrolytes, pregabalin, promethazine, propranolol ER, rosuvastatin, sodium chloride, sotalol, sucralfate, and triamcinolone cream  History Review  Allergy: Cassandra Brown is allergic to cyclobenzaprine, ibuprofen, morphine, meperidine, sulfa antibiotics, and onion. Drug: Cassandra Brown  reports no history of drug use. Alcohol:  reports that she does not currently use alcohol. Tobacco:  reports that she quit smoking about 11 years ago. Her smoking use included cigarettes. She has a 99.00 pack-year smoking history. She has never used smokeless tobacco. Social: Cassandra Brown  reports that she quit smoking about 11 years ago. Her smoking use included cigarettes. She has a 99.00 pack-year smoking history. She has never used smokeless tobacco. She reports that she does not currently use alcohol. She reports that she does not use drugs. Medical:  has a past medical history of Allergic rhinitis due to pollen, Angina pectoris ([place]), Atrial fibrillation ([place]), CHF (congestive heart failure) ([place]), Chronic maxillary sinusitis, Chronic pain syndrome, Chronic right sacroiliac joint pain, Collagen vascular disease ([place]), Coronary artery abnormality, Dysrhythmia, Gastroparesis, GERD (gastroesophageal reflux disease), History of GI bleed, History of kidney stones, Leakage of Watchman left atrial appendage closure device, Leukocytosis, Obesity, Patent foramen ovale, Rheumatoid arthritis ([place]), Sleep apnea, Spinal stenosis of lumbar region, Unequal leg length, Vasculitis ([place]), and Wegener's disease, pulmonary. Surgical: Cassandra Brown  has a past surgical history that includes Joint replacement (Right); Knee surgery (Left); Shoulder surgery (Bilateral); Appendectomy; Cholecystectomy; Thoracic laminectomy for spinal cord stimulator (Bilateral, 06/23/2020); Pulse generator implant (Left,  06/23/2020); Video bronchoscopy with endobronchial ultrasound (N/A, 02/27/2021); Video bronchoscopy with endobronchial navigation (N/A, 02/27/2021); Nasal hemorrhage control (N/A, 05/13/2021); Back surgery; Colonoscopy (N/A, 04/30/2022); and Esophagogastroduodenoscopy (N/A, 04/30/2022). Family: family history is not on file.  Laboratory Chemistry Profile   Renal Lab Results  Component Value Date   BUN 11 01/12/2022   CREATININE 0.65 01/12/2022   GFRAA >60 06/19/2020   GFRNONAA >60 01/12/2022    Hepatic Lab Results  Component Value Date   AST 25 01/10/2022   ALT 25 01/10/2022   ALBUMIN 2.3 (L) 01/10/2022   ALKPHOS 47 01/10/2022   LIPASE 58 (H) 01/04/2022    Electrolytes Lab Results  Component Value Date   NA 144 01/12/2022   K 4.5 01/12/2022   CL 103 01/12/2022   CALCIUM 8.9 01/12/2022   MG 2.0 01/10/2022   PHOS 3.3 01/11/2022    Bone No results found for: "VD25OH", "VD125OH2TOT", "EH6314HF0", "YO3785YI5", "25OHVITD1", "25OHVITD2", "25OHVITD3", "TESTOFREE", "TESTOSTERONE"  Inflammation (CRP: Acute Phase) (ESR: Chronic Phase) Lab Results  Component Value Date   CRP 6.2 (H) 01/12/2022   LATICACIDVEN 1.3 01/05/2022         Note: Above Lab results reviewed.  Recent Imaging Review  MR CERVICAL SPINE WO CONTRAST CLINICAL DATA:  Initial evaluation for chronic wrestling worsening neck pain, pain fall range of motion, radiculopathy.  EXAM: MRI CERVICAL SPINE WITHOUT CONTRAST  TECHNIQUE: Multiplanar, multisequence MR imaging of the cervical spine was performed. No intravenous contrast was administered.  COMPARISON:  Prior MRI from 08/19/2022.  FINDINGS: Alignment: Straightening with mild reversal of the normal cervical lordosis. 2 mm degenerative anterolisthesis of C3 on C4, with trace retrolisthesis of C4 on C5, with trace anterolisthesis of C5 on C6 through C7 on T1. Appearance is stable.  Vertebrae: Vertebral body height maintained without acute or chronic fracture.  Bone marrow signal intensity within normal limits. No discrete or worrisome osseous lesions. No abnormal marrow edema.  Cord: Normal signal morphology.  Posterior Fossa, vertebral arteries, paraspinal tissues: Unremarkable.  Disc levels:  C2-C3: Unremarkable.  C3-C4: Trace anterolisthesis. Mild disc bulge with right-sided uncovertebral spurring. Mild right-sided facet hypertrophy. No spinal stenosis. Foramina remain patent. Appearance is stable.  C4-C5: Trace retrolisthesis with degenerative intervertebral disc space narrowing. Broad left paracentral disc osteophyte complex flattens and partially effaces the ventral thecal sac. No significant spinal stenosis. Left-sided uncovertebral spurring with mild to moderate left C5 foraminal narrowing. Right neural foramen remains patent. Appearance is stable.  C5-C6: Trace anterolisthesis. Mild disc bulge. No canal or foraminal stenosis.  C6-C7: Trace anterolisthesis. Minimal disc bulge. No canal or foraminal stenosis.  C7-T1: Trace anterolisthesis without significant disc bulge. Mild right-sided facet hypertrophy. No canal or foraminal stenosis.  Overall, appearance of the cervical spine is not significantly changed as compared to 08/19/2022.  IMPRESSION: 1. Overall, no significant interval change in appearance of the cervical spine as compared to 08/19/2022. 2. Left eccentric disc osteophyte complex at C4-5 with resultant mild to moderate left C5 foraminal stenosis. 3. Additional mild noncompressive disc bulging at C3-4 through C6-7 without significant stenosis or impingement.  Electronically Signed   By: Jeannine Boga M.D.   On: 12/03/2022 03:03 Note: Reviewed        Physical Exam  General appearance: Well nourished, well developed, and well hydrated. In no apparent acute distress Mental status: Alert, oriented x 3 (person, place, & time)       Respiratory: No evidence of acute respiratory distress Eyes:  PERLA Vitals: BP 124/64 (BP Location: Right Arm, Patient Position: Sitting, Cuff Size: Large)   Pulse 62   Temp (!) 97.3 F (36.3 C) (Temporal)   Resp 16   Ht '5\' 2"'$  (1.575 m)   Wt 200 lb (90.7 kg)   SpO2 98%   BMI 36.58 kg/m  BMI: Estimated body mass index is 36.58 kg/m as calculated from the following:   Height as of this encounter: '5\' 2"'$  (1.575 m).   Weight as of this encounter: 200 lb (90.7 kg). Ideal: Ideal body weight: 50.1 kg (110 lb 7.2 oz) Adjusted ideal body weight: 66.3 kg (146 lb 4.3 oz)  Cervical Spine Area Exam  Skin & Axial Inspection: No masses, redness, edema, swelling, or associated skin lesions Alignment: Symmetrical Functional ROM: Pain restricted ROM, bilaterally Stability: No instability detected Muscle Tone/Strength: Functionally intact. No obvious neuro-muscular anomalies detected. Sensory (Neurological): Musculoskeletal pain pattern, secondary to cervical facets Palpation: Complains of area being tender to palpation Positive provocative maneuver for for cervical facet disease Upper Extremity (UE) Exam    Side: Right upper extremity  Side: Left upper extremity  Skin & Extremity Inspection: Skin color, temperature, and hair growth are WNL. No peripheral edema or cyanosis. No masses, redness, swelling, asymmetry, or associated skin lesions. No contractures.  Skin & Extremity Inspection: Skin color, temperature, and hair growth are WNL. No peripheral edema or cyanosis. No masses, redness, swelling, asymmetry, or associated skin lesions. No contractures.  Functional ROM: Unrestricted ROM          Functional ROM: Unrestricted ROM          Muscle Tone/Strength: Functionally intact. No obvious neuro-muscular anomalies detected.  Muscle Tone/Strength: Functionally intact. No obvious neuro-muscular anomalies detected.  Sensory (Neurological): Unimpaired          Sensory (Neurological): Unimpaired          Palpation: No palpable anomalies              Palpation: No  palpable anomalies              Provocative Test(s):  Phalen's test: deferred Tinel's test: deferred Apley's scratch test (touch opposite shoulder):  Action 1 (Across chest): deferred Action 2 (Overhead): deferred Action 3 (LB reach): deferred   Provocative Test(s):  Phalen's test: deferred Tinel's test: deferred Apley's scratch test (touch opposite shoulder):  Action 1 (Across chest): deferred Action 2 (Overhead): deferred Action 3 (LB reach): deferred    Lumbar Spine Area Exam  Skin & Axial Inspection: Well healed scar from previous spine surgery detected Alignment: Symmetrical Functional ROM: Pain restricted ROM      primarily with lumbar extension Stability: No instability detected Muscle Tone/Strength: Functionally intact. No obvious neuro-muscular anomalies detected. Sensory (Neurological): Musculoskeletal pain pattern likely referred from SI joint Palpation: No palpable anomalies       Provocative Tests:  Patrick's Maneuver: (+) for bilateral S-I arthralgia             FABER* test:(+) for bilateral S-I arthralgia   S-I anterior distraction/compression test: (+) for bilateral S-I arthralgia  S-I lateral compression test: (+) for bilateral S-I arthralgia  S-I Thigh-thrust test:(+) for bilateral S-I arthralgia  S-I Gaenslen's test: (+) for bilateral S-I arthralgia  *(Flexion, ABduction and External Rotation) Gait & Posture Assessment  Ambulation: Unassisted Gait: Relatively normal for age and body habitus Posture: WNL  Lower Extremity Exam    Side: Right lower extremity  Side: Left lower extremity  Stability: No instability observed          Stability: No instability observed          Skin & Extremity Inspection: Skin color, temperature, and hair growth are WNL. No peripheral edema or cyanosis. No masses, redness, swelling, asymmetry, or associated skin lesions. No contractures.  Skin & Extremity Inspection: Skin color, temperature, and hair growth are WNL. No peripheral  edema or cyanosis. No masses, redness, swelling, asymmetry, or associated skin lesions. No contractures.  Functional ROM: Unrestricted ROM                  Functional ROM: Unrestricted ROM                  Muscle Tone/Strength: Functionally intact. No obvious neuro-muscular anomalies detected.  Muscle Tone/Strength: Functionally intact. No obvious neuro-muscular anomalies detected.  Sensory (Neurological): Referred pain pattern        Sensory (Neurological): Referred pain pattern        DTR: Patellar: deferred today Achilles: deferred today Plantar: deferred today  DTR: Patellar: deferred today Achilles: deferred today Plantar: deferred today  Palpation: No palpable anomalies  Palpation: No palpable anomalies    Assessment   Diagnosis Status  1. Cervical facet joint syndrome   2. Degenerative cervical disc   3. SI joint arthritis   4. Chronic sacroiliac joint pain   5. Piriformis muscle pain   6. Spinal cord stimulator status ([employer], implant: Dr Lacinda Axon)   7. Chronic pain syndrome    Deteriorating Persistent Worsening   Updated Problems: Problem  Cervical Facet Joint Syndrome  Degenerative Cervical Disc    Plan of Care  Cassandra Brown has a history of greater than 3 months of moderate to severe pain which is resulted in functional impairment.  The patient has tried various conservative therapeutic options such as NSAIDs, Tylenol, muscle relaxants, physical therapy which was inadequately effective.  Patient's pain is predominantly axial with physical exam findings suggestive of facet arthropathy. Cervical facet medial branch nerve blocks were discussed with the patient.  Risks and benefits were reviewed.  Patient would like to proceed with  bilateral C3, C4, C5,medial branch nerve block.  For her bilateral buttock pain, we discussed repeating SI joint injection and piriformis TPI.  This was previously done for the left side 11/25/2021 and provided her with 75% pain relief for  approximately 6 months.  Given return of pain, which is now bilateral, we discussed repeating bilateral SI joint and piriformis injection.  Risk and benefits reviewed and patient would like to proceed.    1. Cervical facet joint syndrome - CERVICAL FACET (MEDIAL BRANCH NERVE BLOCK) ; Future  2. Degenerative cervical disc - CERVICAL FACET (MEDIAL BRANCH NERVE BLOCK) ; Future  3. SI joint arthritis - SACROILIAC JOINT INJECTION; Future  4. Chronic sacroiliac joint pain - SACROILIAC JOINT INJECTION; Future  5. Piriformis muscle pain - TRIGGER POINT INJECTION; Future  6. Spinal cord stimulator status ([employer], implant: Dr Lacinda Axon)  7. Chronic pain syndrome - CERVICAL FACET (MEDIAL BRANCH NERVE BLOCK) ; Future - SACROILIAC JOINT INJECTION; Future - TRIGGER POINT INJECTION; Future    Orders:  Orders Placed This Encounter  Procedures   CERVICAL FACET (MEDIAL BRANCH NERVE BLOCK)     Standing Status:   Future    Standing Expiration Date:   04/11/2023    Scheduling Instructions:     Side: Bilateral     Level: C3-4, C4-5, Facet joints (C3, C4, C5, Medial Branch Nerves)     Sedation: without     Timeframe: As soon as schedule allows    Order Specific Question:   Where will this procedure be performed?    Answer:   [facility]   SACROILIAC JOINT INJECTION    Standing Status:   Future    Standing Expiration Date:   04/11/2023    Scheduling Instructions:     Side: Bilateral     Sedation: w/o     Timeframe: ASAP    Order Specific Question:   Where will this procedure be performed?    Answer:   [facility]   TRIGGER POINT INJECTION    Area: Buttocks region (gluteal area) Indications: Piriformis muscle pain; B/L  piriformis-syndrome; piriformis muscle spasms (Z61.096). CPT code: 20552    Standing Status:   Future    Standing Expiration Date:   01/11/2024    Scheduling Instructions:     B/L piriformis TPI    Order Specific Question:   Where will this procedure be  performed?    Answer:   [facility]   Follow-up plan:   Return in about 2 weeks (around 01/24/2023) for B/L C3,4,5 MBNB #1, B/L SI-J, B/L Piriformis ( block 1 hr).     Left L4/5 ESI #1 07/13/21, left piriformis, left sacroiliac joint injection 09/09/2021, 10/21/21, 11/25/21. B/L L3,4,5 MBNB #1 09/13/22    Recent Visits Date Type Provider Dept  10/13/22 Office Visit Gillis Santa, MD [facility]  Showing recent visits within past 90 days and meeting all other requirements Today's Visits Date Type Provider Dept  01/10/23 Office Visit Gillis Santa, MD [facility]  Showing today's visits and meeting all other requirements Future Appointments No visits were found meeting these conditions. Showing future appointments within next 90 days and meeting all other requirements  I discussed the assessment and treatment plan with the patient. The patient was provided an opportunity to ask questions and all were answered. The patient agreed with the plan and demonstrated an understanding of the instructions.  Patient advised to call back or seek an in-person evaluation if the symptoms or condition worsens.  Duration of encounter: 44mnutes.  Total time on encounter, as per AMA guidelines included both the face-to-face and non-face-to-face time personally spent by the physician and/or other qualified health care professional(s) on the day of the encounter (includes time in activities that require the physician or other qualified health care professional and does not include time in activities normally performed by clinical staff). Physician's time may include the following activities when performed: Preparing to see the patient (e.g., pre-charting review of records, searching for previously ordered imaging, lab work, and nerve conduction tests) Review of prior analgesic pharmacotherapies. Reviewing PMP Interpreting ordered tests (e.g., lab work, imaging, nerve conduction  tests) Performing post-procedure evaluations, including interpretation of diagnostic procedures Obtaining and/or reviewing separately obtained history Performing a medically appropriate examination and/or evaluation Counseling and educating the patient/family/caregiver Ordering medications, tests, or procedures Referring and communicating with other health care professionals (when not separately reported) Documenting clinical information in the electronic or other health record Independently interpreting results (not separately reported) and communicating results to the patient/ family/caregiver Care coordination (not separately reported)  Note by: BGillis Santa MD Date: 01/10/2023; Time: 2:24 PM

## 2023-01-10 NOTE — Patient Instructions (Signed)
______________________________________________________________________  Preparing for your procedure  During your procedure appointment there will be: No Prescription Refills. No disability issues to discussed. No medication changes or discussions.  Instructions: Food intake: Avoid eating anything solid for at least 8 hours prior to your procedure. Clear liquid intake: You may take clear liquids such as water up to 2 hours prior to your procedure. (No carbonated drinks. No soda.) Transportation: Unless otherwise stated by your physician, bring a driver. Morning Medicines: Except for blood thinners, take all of your other morning medications with a sip of water. Make sure to take your heart and blood pressure medicines. If your blood pressure's lower number is above 100, the case will be rescheduled. Blood thinners: Make sure to stop your blood thinners as instructed.  If you take a blood thinner, but were not instructed to stop it, call our office [phone] and ask to talk to a nurse. Not stopping a blood thinner prior to certain procedures could lead to serious complications. Diabetics on insulin: Notify the staff so that you can be scheduled 1st case in the morning. If your diabetes requires high dose insulin, take only  of your normal insulin dose the morning of the procedure and notify the staff that you have done so. Preventing infections: Shower with an antibacterial soap the morning of your procedure.  Build-up your immune system: Take 1000 mg of Vitamin C with every meal (3 times a day) the day prior to your procedure. Antibiotics: Inform the nursing staff if you are taking any antibiotics or if you have any conditions that may require antibiotics prior to procedures. (Example: recent joint implants)   Pregnancy: If you are pregnant make sure to notify the nursing staff. Not doing so may result in injury to the fetus, including death.  Sickness: If you have a cold, fever, or any  active infections, call and cancel or reschedule your procedure. Receiving steroids while having an infection may result in complications. Arrival: You must be in the facility at least 30 minutes prior to your scheduled procedure. Tardiness: Your scheduled time is also the cutoff time. If you do not arrive at least 15 minutes prior to your procedure, you will be rescheduled.  Children: Do not bring any children with you. Make arrangements to keep them home. Dress appropriately: There is always a possibility that your clothing may get soiled. Avoid long dresses. Valuables: Do not bring any jewelry or valuables.  Reasons to call and reschedule or cancel your procedure: (Following these recommendations will minimize the risk of a serious complication.) Surgeries: Avoid having procedures within 2 weeks of any surgery. (Avoid for 2 weeks before or after any surgery). Flu Shots: Avoid having procedures within 2 weeks of a flu shots or . (Avoid for 2 weeks before or after immunizations). Barium: Avoid having a procedure within 7-10 days after having had a radiological study involving the use of radiological contrast. (Myelograms, Barium swallow or enema study). Heart attacks: Avoid any elective procedures or surgeries for the initial 6 months after a "Myocardial Infarction" (Heart Attack). Blood thinners: It is imperative that you stop these medications before procedures. Let us know if you if you take any blood thinner.  Infection: Avoid procedures during or within two weeks of an infection (including chest colds or gastrointestinal problems). Symptoms associated with infections include: Localized redness, fever, chills, night sweats or profuse sweating, burning sensation when voiding, cough, congestion, stuffiness, runny nose, sore throat, diarrhea, nausea, vomiting, cold or Flu symptoms, recent or   current infections. It is specially important if the infection is over the area that we intend to treat. Heart  and lung problems: Symptoms that may suggest an active cardiopulmonary problem include: cough, chest pain, breathing difficulties or shortness of breath, dizziness, ankle swelling, uncontrolled high or unusually low blood pressure, and/or palpitations. If you are experiencing any of these symptoms, cancel your procedure and contact your primary care physician for an evaluation.  Remember:  Regular Business hours are:  Monday to Thursday 8:00 AM to 4:00 PM  Provider's Schedule: Francisco Naveira, MD:  Procedure days: Tuesday and Thursday 7:30 AM to 4:00 PM  Bilal Lateef, MD:  Procedure days: Monday and Wednesday 7:30 AM to 4:00 PM  ______________________________________________________________________    

## 2023-01-13 ENCOUNTER — Encounter: Payer: Self-pay | Admitting: Gastroenterology

## 2023-01-13 NOTE — H&P (Signed)
Pre-Procedure H&P   Patient ID: Cassandra Brown is a 57 y.o. female.  Gastroenterology Provider: [name], DO  Referring Provider: Laurine Blazer, PA PCP: Baxter Hire, MD  Date: 01/14/2023  HPI Cassandra Brown is a 57 y.o. female who presents today for Esophagogastroduodenoscopy and Colonoscopy for gerd, dysphagia, phx colon polyps.  Patient with a history of HFpEF, A-fib status post Watchman device not on anticoagulation, rheumatoid arthritis, obstructive sleep apnea, chronic pain, granulomatosis with polyangiitis who is being evaluated for the above.  Patient had symptoms of solid and liquid food dysphagia as well as nausea without vomiting.  She notes upper to mid esophagus sticking with increasing pain when things feel like they stick.  She takes multiple deep breaths and this seems to relieve this and allow the area to pass.  This is also improved with twice a day Protonix as well as twice a day Carafate.  Bowel movements occur every other day without melena or hematochezia with the support of Dulcolax and Colace.  Patient is status postcholecystectomy spinal cord stimulator and appendectomy.  EGD performed in May 2023 demonstrating gastritis and esophagitis.  She was dilated with a [place].  Biopsies were negative for intestinal metaplasia H. pylori and Barrett's esophagus.  Colonoscopy at the same time demonstrated a 1 to 1.3 cm polyp in the proximal ascending colon removed via hot snare with 2 clips placed.  This proved to be adenomatous.  An additional 2 tubular adenomas were removed.  Most recent hemoglobin 11.3 MCV 92 platelets 222,000 creatinine 0.8.  In October 2023 she will underwent an upper GI study demonstrating aspiration with suspected normal esophagus.  Due to this aspiration she underwent a follow-up modified barium swallow study in November which was normal.   Past Medical History:  Diagnosis Date   Allergic rhinitis due to  pollen    Angina pectoris (HCC)    Atrial fibrillation (HCC)    CHF (congestive heart failure) (HCC)    Chronic maxillary sinusitis    Chronic pain syndrome    BACK   Chronic right sacroiliac joint pain    Collagen vascular disease (HCC)    Coronary artery abnormality    Dysrhythmia    AFIB,  FREQ PVC   Gastroparesis    GERD (gastroesophageal reflux disease)    History of GI bleed    History of kidney stones    Leakage of Watchman left atrial appendage closure device    Leukocytosis    Obesity    Patent foramen ovale    Rheumatoid arthritis ([place])    OSTEOARTHRITIS   Sleep apnea    Spinal stenosis of lumbar region    Unequal leg length    Vasculitis ([place])    Wegener's disease, pulmonary     Past Surgical History:  Procedure Laterality Date   APPENDECTOMY     BACK SURGERY     Spinal cord stimulator   CHOLECYSTECTOMY     COLONOSCOPY N/A 04/30/2022   Procedure: COLONOSCOPY;  Surgeon: [name], DO;  Location: [facility] ENDOSCOPY;  Service: Gastroenterology;  Laterality: N/A;   ESOPHAGOGASTRODUODENOSCOPY N/A 04/30/2022   Procedure: ESOPHAGOGASTRODUODENOSCOPY (EGD);  Surgeon: [name], DO;  Location: [facility] ENDOSCOPY;  Service: Gastroenterology;  Laterality: N/A;   JOINT REPLACEMENT Right    knee   KNEE SURGERY Left    NASAL HEMORRHAGE CONTROL N/A 05/13/2021   Procedure: EPISTAXIS CONTROL;  Surgeon: Carloyn Manner, MD;  Location: [facility] ORS;  Service: ENT;  Laterality:  N/A;   PULSE GENERATOR IMPLANT Left 06/23/2020   Procedure: LEFT FLANK PULSE GENERATOR IMPLANT;  Surgeon: Deetta Perla, MD;  Location: [facility] ORS;  Service: Neurosurgery;  Laterality: Left;   SHOULDER SURGERY Bilateral    THORACIC LAMINECTOMY FOR SPINAL CORD STIMULATOR Bilateral 06/23/2020   Procedure: THORACIC SPINAL CORD STIMULATOR;  Surgeon: Deetta Perla, MD;  Location: [facility] ORS;  Service: Neurosurgery;  Laterality: Bilateral;   VIDEO BRONCHOSCOPY WITH ENDOBRONCHIAL NAVIGATION N/A  02/27/2021   Procedure: VIDEO BRONCHOSCOPY WITH ENDOBRONCHIAL NAVIGATION;  Surgeon: Ottie Glazier, MD;  Location: [facility] ORS;  Service: Thoracic;  Laterality: N/A;   VIDEO BRONCHOSCOPY WITH ENDOBRONCHIAL ULTRASOUND N/A 02/27/2021   Procedure: VIDEO BRONCHOSCOPY WITH ENDOBRONCHIAL ULTRASOUND;  Surgeon: Ottie Glazier, MD;  Location: [facility] ORS;  Service: Thoracic;  Laterality: N/A;    Family History Mother- colon polyps No h/o GI disease or malignancy  Review of Systems  Constitutional:  Negative for activity change, appetite change, chills, diaphoresis, fatigue, fever and unexpected weight change.  HENT:  Positive for trouble swallowing. Negative for voice change.   Respiratory:  Negative for shortness of breath and wheezing.   Cardiovascular:  Negative for chest pain, palpitations and leg swelling.  Gastrointestinal:  Negative for abdominal distention, abdominal pain, anal bleeding, blood in stool, constipation, diarrhea, nausea, rectal pain and vomiting.       Gerd  Musculoskeletal:  Negative for arthralgias and myalgias.  Skin:  Negative for color change and pallor.  Neurological:  Negative for dizziness, syncope and weakness.  Psychiatric/Behavioral:  Negative for confusion.   All other systems reviewed and are negative.    Medications No current facility-administered medications on file prior to encounter.   Current Outpatient Medications on File Prior to Encounter  Medication Sig Dispense Refill   HYDROcodone-acetaminophen (NORCO) 10-325 MG tablet Take 1 tablet by mouth every 4 (four) hours.     ADVAIR HFA 230-21 MCG/ACT inhaler Inhale 2 puffs into the lungs 2 (two) times daily. (Patient not taking: Reported on 01/10/2023)     albuterol (VENTOLIN HFA) 108 (90 Base) MCG/ACT inhaler Inhale 1-2 puffs into the lungs every 4 (four) hours as needed for wheezing or shortness of breath. 8 g 0   BAC 50-325-40 MG tablet Take 1 tablet by mouth every 4 (four) hours as needed for headache.      carisoprodol (SOMA) 350 MG tablet Take 350 mg by mouth 3 (three) times daily.     cholecalciferol (VITAMIN D3) 25 MCG (1000 UNIT) tablet Take 1,000 Units by mouth daily.     clobetasol cream (TEMOVATE) 4.13 % Apply 1 application topically 2 (two) times daily as needed (rash).     DUPIXENT 300 MG/2ML SOPN Inject 300 mg into the skin every 14 (fourteen) days.     EMGALITY 120 MG/ML SOAJ Inject 120 mg into the skin every 28 (twenty-eight) days.     EPINEPHrine 0.3 mg/0.3 mL IJ SOAJ injection Inject 0.3 mg into the muscle as needed.     folic acid (FOLVITE) 1 MG tablet Take 1 mg by mouth daily.     furosemide (LASIX) 20 MG tablet Take 40 mg by mouth daily.     gentamicin ointment (GARAMYCIN) 0.1 % Apply 1 application topically daily as needed (rash).     methotrexate 2.5 MG tablet Take 25 mg by mouth once a week. Caution:Chemotherapy. Protect from light. 10 tablets once per week     montelukast (SINGULAIR) 10 MG tablet Take 10 mg by mouth at bedtime.     naloxone [facility])  nasal spray 4 mg/0.1 mL For opioid overdose or excessive sleepiness after taking narcotics 1 each 0   NURTEC 75 MG TBDP Take 75 mg by mouth daily as needed for migraine.     pantoprazole (PROTONIX) 40 MG tablet Take 40 mg by mouth 2 (two) times daily.     pentoxifylline (TRENTAL) 400 MG CR tablet Take 400 mg by mouth in the morning and at bedtime.     pimecrolimus (ELIDEL) 1 % cream Apply 1 application topically 2 (two) times daily as needed (rash).     pregabalin (LYRICA) 75 MG capsule Take 75 mg by mouth 3 (three) times daily.     promethazine (PHENERGAN) 25 MG tablet Take 25 mg by mouth every 6 (six) hours as needed for nausea or vomiting.      propranolol ER (INDERAL LA) 80 MG 24 hr capsule Take 80 mg by mouth at bedtime.     rosuvastatin (CRESTOR) 10 MG tablet Take 10 mg by mouth at bedtime.     sodium chloride (OCEAN) 0.65 % SOLN nasal spray Place 1 spray into both nostrils as needed for congestion.     sotalol (BETAPACE)  80 MG tablet Take 40 mg by mouth 2 (two) times daily.     Specialty Vitamins Products (CENTRUM PERFORMANCE) TABS Take 1 tablet by mouth daily.     SPIRIVA RESPIMAT 1.25 MCG/ACT AERS Inhale 2 puffs into the lungs daily.     sucralfate (CARAFATE) 1 g tablet Take 1 g by mouth 2 (two) times daily before a meal.     triamcinolone cream (KENALOG) 0.1 % Apply 1 application topically daily as needed (itching).       Pertinent medications related to GI and procedure were reviewed by me with the patient prior to the procedure   Current Facility-Administered Medications:    0.9 %  sodium chloride infusion, , Intravenous, Continuous, [name], DO      Allergies  Allergen Reactions   Cyclobenzaprine Hives   Ibuprofen Hives   Morphine Itching and Swelling    Other reaction(s): itching/swelling    Meperidine Itching and Swelling   Sulfa Antibiotics Swelling   Onion Itching    Can use onion powder   Allergies were reviewed by me prior to the procedure  Objective   Body mass index is 36.58 kg/m. Vitals:   01/14/23 0812  BP: 124/70  Pulse: (!) 53  Resp: 16  Temp: (!) 96.3 F (35.7 C)  TempSrc: Temporal  SpO2: 98%  Weight: 90.7 kg  Height: '5\' 2"'$  (1.575 m)     Physical Exam Vitals and nursing note reviewed.  Constitutional:      General: She is not in acute distress.    Appearance: Normal appearance. She is obese. She is not ill-appearing, toxic-appearing or diaphoretic.  HENT:     Head: Normocephalic and atraumatic.     Nose: Nose normal.     Mouth/Throat:     Mouth: Mucous membranes are moist.     Pharynx: Oropharynx is clear.  Eyes:     General: No scleral icterus.    Extraocular Movements: Extraocular movements intact.  Cardiovascular:     Rate and Rhythm: Regular rhythm. Bradycardia present.     Heart sounds: Normal heart sounds. No murmur heard.    No friction rub. No gallop.  Pulmonary:     Effort: Pulmonary effort is normal. No respiratory distress.      Breath sounds: Normal breath sounds. No wheezing, rhonchi or rales.  Abdominal:  General: Bowel sounds are normal. There is no distension.     Palpations: Abdomen is soft.     Tenderness: There is no abdominal tenderness. There is no guarding or rebound.  Musculoskeletal:     Cervical back: Neck supple.     Right lower leg: No edema.     Left lower leg: No edema.  Skin:    General: Skin is warm and dry.     Coloration: Skin is not jaundiced or pale.  Neurological:     General: No focal deficit present.     Mental Status: She is alert and oriented to person, place, and time. Mental status is at baseline.  Psychiatric:        Mood and Affect: Mood normal.        Behavior: Behavior normal.        Thought Content: Thought content normal.        Judgment: Judgment normal.      Assessment:  Cassandra Brown is a 57 y.o. female  who presents today for Esophagogastroduodenoscopy and Colonoscopy for gerd, dysphagia, phx colon polyps.  Plan:  Esophagogastroduodenoscopy and Colonoscopy with possible intervention today  Esophagogastroduodenoscopy and Colonoscopy with possible biopsy, control of bleeding, polypectomy, and interventions as necessary has been discussed with the patient/patient representative. Informed consent was obtained from the patient/patient representative after explaining the indication, nature, and risks of the procedure including but not limited to death, bleeding, perforation, missed neoplasm/lesions, cardiorespiratory compromise, and reaction to medications. Opportunity for questions was given and appropriate answers were provided. Patient/patient representative has verbalized understanding is amenable to undergoing the procedure.   [name], DO  [facility]  Portions of the record may have been created with voice recognition software. Occasional wrong-word or 'sound-a-like' substitutions may have occurred due to the  inherent limitations of voice recognition software.  Read the chart carefully and recognize, using context, where substitutions may have occurred.

## 2023-01-14 ENCOUNTER — Ambulatory Visit: Payer: Medicare Other | Admitting: Anesthesiology

## 2023-01-14 ENCOUNTER — Encounter
Admission: RE | Disposition: A | Discharge status: Home / Self Care | Payer: Self-pay | Source: Home / Self Care | Attending: Gastroenterology

## 2023-01-14 ENCOUNTER — Encounter: Payer: Self-pay | Admitting: Gastroenterology

## 2023-01-14 ENCOUNTER — Ambulatory Visit
Admission: RE | Admit: 2023-01-14 | Discharge: 2023-01-14 | Disposition: A | Discharge status: Home / Self Care | Payer: Medicare Other | Attending: Gastroenterology | Admitting: Gastroenterology

## 2023-01-14 ENCOUNTER — Other Ambulatory Visit: Payer: Self-pay

## 2023-01-14 DIAGNOSIS — E669 Obesity, unspecified: Secondary | ICD-10-CM | POA: Diagnosis not present

## 2023-01-14 DIAGNOSIS — I252 Old myocardial infarction: Secondary | ICD-10-CM | POA: Insufficient documentation

## 2023-01-14 DIAGNOSIS — Z86711 Personal history of pulmonary embolism: Secondary | ICD-10-CM | POA: Insufficient documentation

## 2023-01-14 DIAGNOSIS — K219 Gastro-esophageal reflux disease without esophagitis: Secondary | ICD-10-CM | POA: Insufficient documentation

## 2023-01-14 DIAGNOSIS — Z79899 Other long term (current) drug therapy: Secondary | ICD-10-CM | POA: Insufficient documentation

## 2023-01-14 DIAGNOSIS — K640 First degree hemorrhoids: Secondary | ICD-10-CM | POA: Diagnosis not present

## 2023-01-14 DIAGNOSIS — M069 Rheumatoid arthritis, unspecified: Secondary | ICD-10-CM | POA: Insufficient documentation

## 2023-01-14 DIAGNOSIS — I4819 Other persistent atrial fibrillation: Secondary | ICD-10-CM | POA: Diagnosis not present

## 2023-01-14 DIAGNOSIS — Z87891 Personal history of nicotine dependence: Secondary | ICD-10-CM | POA: Insufficient documentation

## 2023-01-14 DIAGNOSIS — G894 Chronic pain syndrome: Secondary | ICD-10-CM | POA: Insufficient documentation

## 2023-01-14 DIAGNOSIS — D123 Benign neoplasm of transverse colon: Secondary | ICD-10-CM | POA: Insufficient documentation

## 2023-01-14 DIAGNOSIS — Z86010 Personal history of colonic polyps: Secondary | ICD-10-CM | POA: Diagnosis not present

## 2023-01-14 DIAGNOSIS — K2980 Duodenitis without bleeding: Secondary | ICD-10-CM | POA: Insufficient documentation

## 2023-01-14 DIAGNOSIS — Z86718 Personal history of other venous thrombosis and embolism: Secondary | ICD-10-CM | POA: Diagnosis not present

## 2023-01-14 DIAGNOSIS — K449 Diaphragmatic hernia without obstruction or gangrene: Secondary | ICD-10-CM | POA: Insufficient documentation

## 2023-01-14 DIAGNOSIS — I2510 Atherosclerotic heart disease of native coronary artery without angina pectoris: Secondary | ICD-10-CM | POA: Diagnosis not present

## 2023-01-14 DIAGNOSIS — Z7951 Long term (current) use of inhaled steroids: Secondary | ICD-10-CM | POA: Diagnosis not present

## 2023-01-14 DIAGNOSIS — I5032 Chronic diastolic (congestive) heart failure: Secondary | ICD-10-CM | POA: Insufficient documentation

## 2023-01-14 DIAGNOSIS — K2970 Gastritis, unspecified, without bleeding: Secondary | ICD-10-CM | POA: Insufficient documentation

## 2023-01-14 DIAGNOSIS — J45909 Unspecified asthma, uncomplicated: Secondary | ICD-10-CM | POA: Insufficient documentation

## 2023-01-14 DIAGNOSIS — K224 Dyskinesia of esophagus: Secondary | ICD-10-CM | POA: Diagnosis not present

## 2023-01-14 DIAGNOSIS — G4730 Sleep apnea, unspecified: Secondary | ICD-10-CM | POA: Insufficient documentation

## 2023-01-14 DIAGNOSIS — Z09 Encounter for follow-up examination after completed treatment for conditions other than malignant neoplasm: Secondary | ICD-10-CM | POA: Insufficient documentation

## 2023-01-14 DIAGNOSIS — M1990 Unspecified osteoarthritis, unspecified site: Secondary | ICD-10-CM | POA: Diagnosis not present

## 2023-01-14 DIAGNOSIS — R1310 Dysphagia, unspecified: Secondary | ICD-10-CM | POA: Diagnosis not present

## 2023-01-14 HISTORY — PX: ESOPHAGOGASTRODUODENOSCOPY (EGD) WITH PROPOFOL: SHX5813

## 2023-01-14 HISTORY — PX: COLONOSCOPY WITH PROPOFOL: SHX5780

## 2023-01-14 SURGERY — COLONOSCOPY WITH PROPOFOL
Anesthesia: General

## 2023-01-14 MED ORDER — PHENYLEPHRINE HCL (PRESSORS) 10 MG/ML IV SOLN
INTRAVENOUS | Status: DC | PRN
  Administered 2023-01-14 (×3): 80 ug via INTRAVENOUS

## 2023-01-14 MED ORDER — GLYCOPYRROLATE 0.2 MG/ML IJ SOLN
INTRAMUSCULAR | Status: DC | PRN
  Administered 2023-01-14: .2 mg via INTRAVENOUS

## 2023-01-14 MED ORDER — PROPOFOL 500 MG/50ML IV EMUL
INTRAVENOUS | Status: DC | PRN
  Administered 2023-01-14: 200 ug/kg/min via INTRAVENOUS

## 2023-01-14 MED ORDER — PROPOFOL 10 MG/ML IV BOLUS
INTRAVENOUS | Status: DC | PRN
  Administered 2023-01-14: 60 mg via INTRAVENOUS

## 2023-01-14 MED ORDER — SODIUM CHLORIDE 0.9 % IV SOLN: INTRAVENOUS | Status: DC

## 2023-01-14 NOTE — Anesthesia Procedure Notes (Signed)
Date/Time: 01/14/2023 8:56 AM  Performed by: Nelda Marseille, CRNAPre-anesthesia Checklist: Patient identified, Emergency Drugs available, Suction available, Patient being monitored and Timeout performed Oxygen Delivery Method: Nasal cannula

## 2023-01-14 NOTE — Op Note (Signed)
[facility] Gastroenterology Patient Name: Cassandra Brown Procedure Date: 01/14/2023 8:13 AM MRN: [id number] Account #: [id number] Date of Birth: Apr 28, 1966 Admit Type: Outpatient Age: 57 Room: [facility] ENDO ROOM 1 Gender: Female Note Status: Finalized Instrument Name: Colonoscope [id number] Procedure:             Colonoscopy Indications:           High risk colon cancer surveillance: Personal history                         of colonic polyps Providers:             Rueben Bash, DO Referring MD:          Baxter Hire, MD (Referring MD) Medicines:             Monitored Anesthesia Care Complications:         No immediate complications. Estimated blood loss:                         Minimal. Procedure:             Pre-Anesthesia Assessment:                        - Prior to the procedure, a History and Physical was                         performed, and patient medications and allergies were                         reviewed. The patient is competent. The risks and                         benefits of the procedure and the sedation options and                         risks were discussed with the patient. All questions                         were answered and informed consent was obtained.                         Patient identification and proposed procedure were                         verified by the physician, the nurse, the anesthetist                         and the technician in the endoscopy suite. Mental                         Status Examination: alert and oriented. Airway                         Examination: normal oropharyngeal airway and neck                         mobility. Respiratory Examination: clear to  auscultation. CV Examination: RRR, no murmurs, no S3                         or S4. Prophylactic Antibiotics: The patient does not                         require prophylactic antibiotics. Prior                          Anticoagulants: The patient has taken no anticoagulant                         or antiplatelet agents. ASA Grade Assessment: III - A                         patient with severe systemic disease. After reviewing                         the risks and benefits, the patient was deemed in                         satisfactory condition to undergo the procedure. The                         anesthesia plan was to use monitored anesthesia care                         (MAC). Immediately prior to administration of                         medications, the patient was re-assessed for adequacy                         to receive sedatives. The heart rate, respiratory                         rate, oxygen saturations, blood pressure, adequacy of                         pulmonary ventilation, and response to care were                         monitored throughout the procedure. The physical                         status of the patient was re-assessed after the                         procedure.                        After obtaining informed consent, the colonoscope was                         passed under direct vision. Throughout the procedure,                         the patient's blood pressure, pulse, and oxygen  saturations were monitored continuously. The                         Colonoscope was introduced through the anus and                         advanced to the the cecum, identified by appendiceal                         orifice and ileocecal valve. The colonoscopy was                         performed without difficulty. The patient tolerated                         the procedure well. The quality of the bowel                         preparation was evaluated using the BBPS [facility] Bowel                         Preparation Scale) with scores of: Right Colon = 2                         (minor amount of residual staining, small fragments of                         stool  and/or opaque liquid, but mucosa seen well),                         Transverse Colon = 2 (minor amount of residual                         staining, small fragments of stool and/or opaque                         liquid, but mucosa seen well) and Left Colon = 2                         (minor amount of residual staining, small fragments of                         stool and/or opaque liquid, but mucosa seen well). The                         total BBPS score equals 6. The quality of the bowel                         preparation was good. The ileocecal valve, appendiceal                         orifice, and rectum were photographed. Findings:      The perianal and digital rectal examinations were normal. Pertinent       negatives include normal sphincter tone.      Non-bleeding internal hemorrhoids were found during retroflexion. The       hemorrhoids were Grade I (internal hemorrhoids that do  not prolapse).       Estimated blood loss: none.      Anal papilla(e) were hypertrophied. Estimated blood loss: none.      A 1 mm polyp was found in the transverse colon. The polyp was sessile.       The polyp was removed with a jumbo cold forceps. Resection and retrieval       were complete. Estimated blood loss was minimal.      previous polypectomy site at cecum/proximal ascending colon reviewed- no       signs of residual polyp after large amounts of lavage to clean colon       wall. Estimated blood loss: none.      Adherent to the wall stool was found in the entire colon, interfering       with visualization. Lavage of the area was performed using a large       amount, resulting in clearance with good visualization. Estimated blood       loss: none.      The exam was otherwise without abnormality on direct and retroflexion       views. Impression:            - Non-bleeding internal hemorrhoids.                        - Anal papilla(e) were hypertrophied.                        - One 1 mm polyp  in the transverse colon, removed with                         a jumbo cold forceps. Resected and retrieved.                        - Stool in the entire examined colon.                        - The examination was otherwise normal on direct and                         retroflexion views. Recommendation:        - Patient has a contact number available for                         emergencies. The signs and symptoms of potential                         delayed complications were discussed with the patient.                         Return to normal activities tomorrow. Written                         discharge instructions were provided to the patient.                        - Discharge patient to home.                        - Resume previous diet.                        -  Continue present medications.                        - Await pathology results.                        - Repeat colonoscopy for surveillance based on                         pathology results.                        - Return to GI office as previously scheduled.                        - The findings and recommendations were discussed with                         the patient. Procedure Code(s):     --- Professional ---                        [phone], Colonoscopy, flexible; with biopsy, single or                         multiple Diagnosis Code(s):     --- Professional ---                        Z86.010, Personal history of colonic polyps                        K62.89, Other specified diseases of anus and rectum                        D12.3, Benign neoplasm of transverse colon (hepatic                         flexure or splenic flexure)                        K64.0, First degree hemorrhoids CPT copyright 2022 American [employer] Association. All rights reserved. The codes documented in this report are preliminary and upon coder review may  be revised to meet current compliance requirements. Attending Participation:      I  personally performed the entire procedure. Volney American, DO [name] DO, DO 01/14/2023 9:31:10 AM This report has been signed electronically. Number of Addenda: 0 Note Initiated On: 01/14/2023 8:13 AM Scope Withdrawal Time: 0 hours 16 minutes 13 seconds  Total Procedure Duration: 0 hours 22 minutes 26 seconds  Estimated Blood Loss:  Estimated blood loss was minimal.      [facility]

## 2023-01-14 NOTE — Anesthesia Postprocedure Evaluation (Signed)
Anesthesia Post Note  Patient: Cassandra Brown  Procedure(s) Performed: COLONOSCOPY WITH PROPOFOL ESOPHAGOGASTRODUODENOSCOPY (EGD) WITH PROPOFOL  Patient location during evaluation: PACU Anesthesia Type: General Level of consciousness: awake and alert, oriented and patient cooperative Pain management: pain level controlled Vital Signs Assessment: post-procedure vital signs reviewed and stable Respiratory status: spontaneous breathing, nonlabored ventilation and respiratory function stable Cardiovascular status: blood pressure returned to baseline and stable Postop Assessment: adequate PO intake Anesthetic complications: no   No notable events documented.   Last Vitals:  Vitals:   01/14/23 0947 01/14/23 0949  BP:  125/68  Pulse: 64 64  Resp: 18 13  Temp:    SpO2: 99% 99%    Last Pain:  Vitals:   01/14/23 0949  TempSrc:   PainSc: 0-No pain                 Darrin Nipper

## 2023-01-14 NOTE — Anesthesia Preprocedure Evaluation (Addendum)
Anesthesia Evaluation  Patient identified by MRN, date of birth, ID band Patient awake    Reviewed: Allergy & Precautions, NPO status , Patient's Chart, lab work & pertinent test results  History of Anesthesia Complications Negative for: history of anesthetic complications  Airway Mallampati: I   Neck ROM: Full    Dental  (+) Upper Dentures, Lower Dentures   Pulmonary asthma , sleep apnea , former smoker (quit 2012)   Pulmonary exam normal breath sounds clear to auscultation       Cardiovascular + CAD (s/p MI) and +CHF  Normal cardiovascular exam+ dysrhythmias (a fib s/p Watchman) + Valvular Problems/Murmurs (PFO)  Rhythm:Regular Rate:Normal  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 No peri-device leaks or thrombus on the    device.    2-Normal LV and RV systolic function    3-Interatrial flow communication with left to right flow at site of    previous septal puncture.    4-No significant valvular abnormality noted    Neuro/Psych Chronic pain    GI/Hepatic ,GERD  ,,  Endo/Other  Obesity   Renal/GU Renal disease (neprolithiasis)     Musculoskeletal  (+) Arthritis ,    Abdominal   Peds  Hematology   Anesthesia Other Findings Cardiology note 11/24/22:  57 y.o. female with  1. Longstanding persistent atrial fibrillation (CMS-HCC)  2. Need for vaccination  3. Presence of Watchman left atrial appendage closure device  4. Chronic diastolic congestive heart failure (CMS-HCC)   57 year old female with a history of paroxysmal atrial fibrillation, on sotalol for rate and rhythm control with a prior chads vasc score of 2. The patient was started on Eliquis 5 mg twice daily, but was unable to tolerate it due to frequent nosebleeds with a history of Wegener's disease with nose involvement, and rectal bleeding secondary to internal and external  hemorrhoids. Holter monitor revealed predominant sinus rhythm with a mean heart rate of 85 bpm with occasional premature atrial contractions. She had occasional atrial runs with the longest lasting 17 beats. 2D echocardiogram revealed normal left ventricular function with trivial valvular insufficiencies. The patient underwent successful watchman left atrial appendage occluder device on 03/11/2021. Transesophageal echocardiogram 04/24/2021 revealed normal left ventricular function with well-seated watchman device. She restarted sotalol 80 mg every morning with afternoon palpitations, which improved after changing sotalol dose to 40 mg twice daily. Repeat 72-hour Holter monitor 12/07/2021 - 12/10/2021 revealed predominant sinus rhythm without significant atrial fibrillation or atrial flutter. Patient admitted 01/05/2022 with COVID illness, discovered to have left peroneal DVT and bilateral multiple pulmonary emboli, back on Eliquis.   At 2 prior office visits, patient was having worsening symptoms of hypervolemia likely in part due to dietary sodium indiscretion with bilateral ankle and hand edema, non-exertional chest pressure, shortness of breath, and weight gain. BNP at the time was 56.7 and chest xray on 02/26/2022 was notable for cardiomegaly, mild pulmonary edema, and a conspicuous consolidative opacity overlying the left lower lung, possible representing atypical edema vs infection vs infiltrate. A repeat chest x-ray was recommended in 4 to 6 weeks. Patient's Lasix was increased to 40 mg and with the addition of metolazone. Upon return visit she was further diuresed with another 3 doses of metolazone. Patient now reports stable symptoms on Lasix 40 mg once daily. Eliquis has been discontinued after 6 months of therapy.  Plan   Continue furosemide 40 mg for now 2. Counseled  patient about low-sodium diet 3. Continue sotalol 40 mg twice daily 4. Recommend regular exercise and weight loss 5. Return to clinic  for follow up in 4 months  No orders of the defined types were placed in this encounter.  Return in about 4 months (around 03/25/2023).    Reproductive/Obstetrics                             Anesthesia Physical Anesthesia Plan  ASA: 3  Anesthesia Plan: General   Post-op Pain Management:    Induction: Intravenous  PONV Risk Score and Plan: 3 and Propofol infusion, TIVA and Treatment may vary due to age or medical condition  Airway Management Planned: Natural Airway  Additional Equipment:   Intra-op Plan:   Post-operative Plan:   Informed Consent: I have reviewed the patients History and Physical, chart, labs and discussed the procedure including the risks, benefits and alternatives for the proposed anesthesia with the patient or authorized representative who has indicated his/her understanding and acceptance.       Plan Discussed with: CRNA  Anesthesia Plan Comments: (LMA/GETA backup discussed.  Patient consented for risks of anesthesia including but not limited to:  - adverse reactions to medications - damage to eyes, teeth, lips or other oral mucosa - nerve damage due to positioning  - sore throat or hoarseness - damage to heart, brain, nerves, lungs, other parts of body or loss of life  Informed patient about role of CRNA in peri- and intra-operative care.  Patient voiced understanding.)        Anesthesia Quick Evaluation

## 2023-01-14 NOTE — Op Note (Signed)
[facility] Gastroenterology Patient Name: Cassandra Brown Procedure Date: 01/14/2023 8:13 AM MRN: [id number] Account #: [id number] Date of Birth: August 31, 1966 Admit Type: Outpatient Age: 57 Room: [facility] ENDO ROOM 1 Gender: Female Note Status: Finalized Instrument Name: Upper Endoscope [id number] Procedure:             Upper GI endoscopy Indications:           Dysphagia Providers:             Rueben Bash, DO Referring MD:          Baxter Hire, MD (Referring MD) Medicines:             Monitored Anesthesia Care Complications:         No immediate complications. Estimated blood loss: None. Procedure:             Pre-Anesthesia Assessment:                        - Prior to the procedure, a History and Physical was                         performed, and patient medications and allergies were                         reviewed. The patient is competent. The risks and                         benefits of the procedure and the sedation options and                         risks were discussed with the patient. All questions                         were answered and informed consent was obtained.                         Patient identification and proposed procedure were                         verified by the physician, the nurse, the anesthetist                         and the technician in the endoscopy suite. Mental                         Status Examination: alert and oriented. Airway                         Examination: normal oropharyngeal airway and neck                         mobility. Respiratory Examination: clear to                         auscultation. CV Examination: RRR, no murmurs, no S3                         or S4. Prophylactic Antibiotics: The patient does not  require prophylactic antibiotics. Prior                         Anticoagulants: The patient has taken no anticoagulant                         or antiplatelet agents.  ASA Grade Assessment: III - A                         patient with severe systemic disease. After reviewing                         the risks and benefits, the patient was deemed in                         satisfactory condition to undergo the procedure. The                         anesthesia plan was to use monitored anesthesia care                         (MAC). Immediately prior to administration of                         medications, the patient was re-assessed for adequacy                         to receive sedatives. The heart rate, respiratory                         rate, oxygen saturations, blood pressure, adequacy of                         pulmonary ventilation, and response to care were                         monitored throughout the procedure. The physical                         status of the patient was re-assessed after the                         procedure.                        After obtaining informed consent, the endoscope was                         passed under direct vision. Throughout the procedure,                         the patient's blood pressure, pulse, and oxygen                         saturations were monitored continuously. The Endoscope                         was introduced through the mouth, and advanced to the  second part of duodenum. The upper GI endoscopy was                         accomplished without difficulty. The patient tolerated                         the procedure well. Findings:      Localized mild inflammation characterized by erythema and granularity       was found in the duodenal bulb. Estimated blood loss: none.      Localized mild inflammation characterized by erosions and erythema was       found in the gastric antrum. previous biopsies negative. repeat biopsies       not performed Estimated blood loss: none.      A small hiatal hernia was present. Estimated blood loss: none.      The Z-line was regular.  Estimated blood loss: none.      Esophagogastric landmarks were identified: the gastroesophageal junction       was found at 35 cm from the incisors.      Abnormal motility was noted in the esophagus. The cricopharyngeus was       normal. There is spasticity of the esophageal body. The distal       esophagus/lower esophageal sphincter is open. Tertiary peristaltic waves       are noted. The scope was withdrawn. Dilation was performed with a       Maloney dilator with no resistance at 52 Fr. The dilation site was       examined following endoscope reinsertion and showed no change. Estimated       blood loss: none.      The exam of the esophagus was otherwise normal. Impression:            - Duodenitis.                        - Gastritis.                        - Small hiatal hernia.                        - Z-line regular.                        - Esophagogastric landmarks identified.                        - Abnormal esophageal motility, suspicious for                         presbyesophagus. Dilated.                        - No specimens collected. Recommendation:        - Patient has a contact number available for                         emergencies. The signs and symptoms of potential                         delayed complications were discussed with the patient.  Return to normal activities tomorrow. Written                         discharge instructions were provided to the patient.                        - Discharge patient to home.                        - Resume previous diet.                        - Moisten and chew food well. small bites. eat slowly.                        - Continue present medications.                        - Return to GI clinic as previously scheduled.                        - The findings and recommendations were discussed with                         the patient.                        - proceed with colonoscopy Procedure  Code(s):     --- Professional ---                        [phone], Esophagogastroduodenoscopy, flexible,                         transoral; diagnostic, including collection of                         specimen(s) by brushing or washing, when performed                         (separate procedure)                        [id number], Dilation of esophagus, by unguided sound or                         bougie, single or multiple passes Diagnosis Code(s):     --- Professional ---                        K29.80, Duodenitis without bleeding                        K29.70, Gastritis, unspecified, without bleeding                        K44.9, Diaphragmatic hernia without obstruction or                         gangrene                        K22.4, Dyskinesia of esophagus  R13.10, Dysphagia, unspecified CPT copyright 2022 American Medical Association. All rights reserved. The codes documented in this report are preliminary and upon coder review may  be revised to meet current compliance requirements. Attending Participation:      I personally performed the entire procedure. Volney American, DO [name] DO, DO 01/14/2023 8:57:21 AM This report has been signed electronically. Number of Addenda: 0 Note Initiated On: 01/14/2023 8:13 AM Estimated Blood Loss:  Estimated blood loss: none.      [facility]

## 2023-01-14 NOTE — Interval H&P Note (Signed)
History and Physical Interval Note: Preprocedure H&P from 01/14/23  was reviewed and there was no interval change after seeing and examining the patient.  Written consent was obtained from the patient after discussion of risks, benefits, and alternatives. Patient has consented to proceed with Esophagogastroduodenoscopy and Colonoscopy with possible intervention   01/14/2023 8:25 AM  Cassandra Brown  has presented today for surgery, with the diagnosis of K21.9 - Gastroesophageal reflux disease, unspecified whether esophagitis present R13.10  - Dysphagia, unspecified type Z86.010  - Personal history of colonic polyps.  The various methods of treatment have been discussed with the patient and family. After consideration of risks, benefits and other options for treatment, the patient has consented to  Procedure(s): COLONOSCOPY WITH PROPOFOL (N/A) ESOPHAGOGASTRODUODENOSCOPY (EGD) WITH PROPOFOL (N/A) as a surgical intervention.  The patient's history has been reviewed, patient examined, no change in status, stable for surgery.  I have reviewed the patient's chart and labs.  Questions were answered to the patient's satisfaction.     [name]

## 2023-01-14 NOTE — Transfer of Care (Signed)
Immediate Anesthesia Transfer of Care Note  Patient: Cassandra Brown  Procedure(s) Performed: COLONOSCOPY WITH PROPOFOL ESOPHAGOGASTRODUODENOSCOPY (EGD) WITH PROPOFOL  Patient Location: PACU  Anesthesia Type:General  Level of Consciousness: awake, alert , and oriented  Airway & Oxygen Therapy: Patient Spontanous Breathing and Patient connected to nasal cannula oxygen  Post-op Assessment: Report given to RN and Post -op Vital signs reviewed and stable  Post vital signs: Reviewed and stable  Last Vitals:  Vitals Value Taken Time  BP 104/64 01/14/23 0927  Temp 35.9 C 01/14/23 0927  Pulse 74 01/14/23 0927  Resp 15 01/14/23 0927  SpO2 98 % 01/14/23 0927    Last Pain:  Vitals:   01/14/23 0812  TempSrc: Temporal  PainSc: 7          Complications: No notable events documented.

## 2023-01-17 ENCOUNTER — Encounter: Payer: Self-pay | Admitting: Gastroenterology

## 2023-01-17 LAB — SURGICAL PATHOLOGY

## 2023-02-16 ENCOUNTER — Ambulatory Visit
Admission: RE | Admit: 2023-02-16 | Discharge: 2023-02-16 | Disposition: A | Discharge status: Home / Self Care | Payer: Medicare Other | Source: Ambulatory Visit | Attending: Student in an Organized Health Care Education/Training Program | Admitting: Student in an Organized Health Care Education/Training Program

## 2023-02-16 ENCOUNTER — Encounter: Payer: Self-pay | Admitting: Student in an Organized Health Care Education/Training Program

## 2023-02-16 ENCOUNTER — Ambulatory Visit
Payer: Medicare Other | Attending: Student in an Organized Health Care Education/Training Program | Admitting: Student in an Organized Health Care Education/Training Program

## 2023-02-16 VITALS — BP 104/81 | HR 51 | Temp 97.0°F | Resp 18 | Ht 62.0 in | Wt 205.0 lb

## 2023-02-16 DIAGNOSIS — M5030 Other cervical disc degeneration, unspecified cervical region: Secondary | ICD-10-CM | POA: Insufficient documentation

## 2023-02-16 DIAGNOSIS — M5382 Other specified dorsopathies, cervical region: Secondary | ICD-10-CM

## 2023-02-16 DIAGNOSIS — G894 Chronic pain syndrome: Secondary | ICD-10-CM | POA: Insufficient documentation

## 2023-02-16 DIAGNOSIS — M47818 Spondylosis without myelopathy or radiculopathy, sacral and sacrococcygeal region: Secondary | ICD-10-CM

## 2023-02-16 DIAGNOSIS — M47812 Spondylosis without myelopathy or radiculopathy, cervical region: Secondary | ICD-10-CM | POA: Diagnosis not present

## 2023-02-16 DIAGNOSIS — M4692 Unspecified inflammatory spondylopathy, cervical region: Secondary | ICD-10-CM | POA: Insufficient documentation

## 2023-02-16 DIAGNOSIS — G5703 Lesion of sciatic nerve, bilateral lower limbs: Secondary | ICD-10-CM | POA: Diagnosis not present

## 2023-02-16 DIAGNOSIS — Z79899 Other long term (current) drug therapy: Secondary | ICD-10-CM | POA: Diagnosis not present

## 2023-02-16 DIAGNOSIS — M47819 Spondylosis without myelopathy or radiculopathy, site unspecified: Secondary | ICD-10-CM | POA: Insufficient documentation

## 2023-02-16 DIAGNOSIS — G8929 Other chronic pain: Secondary | ICD-10-CM

## 2023-02-16 DIAGNOSIS — M533 Sacrococcygeal disorders, not elsewhere classified: Secondary | ICD-10-CM | POA: Diagnosis not present

## 2023-02-16 DIAGNOSIS — M542 Cervicalgia: Secondary | ICD-10-CM | POA: Insufficient documentation

## 2023-02-16 MED ORDER — ROPIVACAINE HCL 2 MG/ML IJ SOLN
9.0000 mL | Freq: Once | INTRAMUSCULAR | Status: AC
  Administered 2023-02-16: 9 mL via PERINEURAL

## 2023-02-16 MED ORDER — ROPIVACAINE HCL 2 MG/ML IJ SOLN
INTRAMUSCULAR | Status: AC
  Filled 2023-02-16: qty 40

## 2023-02-16 MED ORDER — ROPIVACAINE HCL 2 MG/ML IJ SOLN
9.0000 mL | Freq: Once | INTRAMUSCULAR | Status: AC
  Administered 2023-02-16: 9 mL via INTRA_ARTICULAR

## 2023-02-16 MED ORDER — DEXAMETHASONE SODIUM PHOSPHATE 10 MG/ML IJ SOLN
10.0000 mg | Freq: Once | INTRAMUSCULAR | Status: AC
  Administered 2023-02-16: 10 mg

## 2023-02-16 MED ORDER — MIDAZOLAM HCL 2 MG/2ML IJ SOLN
0.5000 mg | Freq: Once | INTRAMUSCULAR | Status: AC
  Administered 2023-02-16: 2 mg via INTRAVENOUS
  Filled 2023-02-16: qty 2

## 2023-02-16 MED ORDER — METHYLPREDNISOLONE ACETATE 40 MG/ML IJ SUSP
INTRAMUSCULAR | Status: AC
  Filled 2023-02-16: qty 2

## 2023-02-16 MED ORDER — IOHEXOL 180 MG/ML  SOLN
10.0000 mL | Freq: Once | INTRAMUSCULAR | Status: AC
  Administered 2023-02-16: 10 mL via INTRA_ARTICULAR

## 2023-02-16 MED ORDER — LIDOCAINE HCL 2 % IJ SOLN
20.0000 mL | Freq: Once | INTRAMUSCULAR | Status: AC
  Administered 2023-02-16: 400 mg

## 2023-02-16 MED ORDER — METHYLPREDNISOLONE ACETATE 80 MG/ML IJ SUSP
INTRAMUSCULAR | Status: AC
  Filled 2023-02-16: qty 1

## 2023-02-16 MED ORDER — DEXAMETHASONE SODIUM PHOSPHATE 10 MG/ML IJ SOLN
INTRAMUSCULAR | Status: AC
  Filled 2023-02-16: qty 2

## 2023-02-16 MED ORDER — LIDOCAINE HCL 2 % IJ SOLN
INTRAMUSCULAR | Status: AC
  Filled 2023-02-16: qty 20

## 2023-02-16 MED ORDER — IOHEXOL 180 MG/ML  SOLN
INTRAMUSCULAR | Status: AC
  Filled 2023-02-16: qty 20

## 2023-02-16 MED ORDER — METHYLPREDNISOLONE ACETATE 80 MG/ML IJ SUSP
80.0000 mg | Freq: Once | INTRAMUSCULAR | Status: AC
  Administered 2023-02-16: 80 mg via INTRA_ARTICULAR

## 2023-02-16 MED ORDER — DEXAMETHASONE SODIUM PHOSPHATE 10 MG/ML IJ SOLN
10.0000 mg | Freq: Once | INTRAMUSCULAR | Status: AC
  Administered 2023-02-16: 10 mg
  Filled 2023-02-16: qty 1

## 2023-02-16 MED ORDER — LACTATED RINGERS IV SOLN: Freq: Once | INTRAVENOUS | Status: AC

## 2023-02-16 NOTE — Progress Notes (Signed)
PROVIDER NOTE: Interpretation of information contained herein should be left to medically-trained personnel. Specific patient instructions are provided elsewhere under "Patient Instructions" section of medical record. This document was created in part using STT-dictation technology, any transcriptional errors that may result from this process are unintentional.  Patient: Cassandra Brown Type: Established DOB: May 31, 1966 MRN: [id number] PCP: Baxter Hire, MD  Service: Procedure DOS: 02/16/2023 Setting: Ambulatory Location: Ambulatory outpatient facility Delivery: Face-to-face Provider: Gillis Santa, MD Specialty: Interventional Pain Management Specialty designation: 09 Location: Outpatient facility Ref. Prov.: Gillis Santa, MD       Interventional Therapy   Procedure: Sacroiliac Joint Steroid Injection #1   and bilateral piriformis trigger point injection Laterality: Bilateral     Level: PSIS (Posterior Inferior Iliac Spine)  Imaging: Fluoroscopic guidance Anesthesia: Local anesthesia (1-2% Lidocaine) Anxiolysis: IV Versed 1.5 mg Sedation: Minimal Sedation                       DOS: 02/16/2023  Performed by: Gillis Santa, MD  Purpose: Diagnostic/Therapeutic Indications: Sacroiliac joint pain in the lower back and hip area severe enough to impact quality of life or function.  Bilateral piriformis syndrome. Rationale (medical necessity): procedure needed and proper for the diagnosis and/or treatment of Ms. Noe's medical symptoms and needs. 1. Cervical facet joint syndrome   2. Degenerative cervical disc   3. Chronic pain syndrome    NAS-11 Pain score:   Pre-procedure: 7 /10   Post-procedure:  (neck 5/10  back  7/10   piriformis 6/10)/10     Target: Interarticular sacroiliac joint. Location: Medial to the postero-medial edge of iliac spine. Region: Lumbosacral-sacrococcygeal. Approach: Inferior postero-medial percutaneous approach. Type of procedure: Percutaneous joint  injection.  Position / Prep / Materials:  Position: Prone  Prep solution: DuraPrep (Iodine Povacrylex [0.7% available iodine] and Isopropyl Alcohol, 74% w/w) Prep Area: Entire posterior lumbosacral area  Materials:  Tray: Block Needle(s):  Type: Spinal  Gauge (G): 22  Length: 3.5-in Qty: 2  Pre-op H&P Assessment:  Cassandra Brown is a 57 y.o. (year old), female patient, seen today for interventional treatment. She  has a past surgical history that includes Joint replacement (Right); Knee surgery (Left); Shoulder surgery (Bilateral); Appendectomy; Cholecystectomy; Thoracic laminectomy for spinal cord stimulator (Bilateral, 06/23/2020); Pulse generator implant (Left, 06/23/2020); Video bronchoscopy with endobronchial ultrasound (N/A, 02/27/2021); Video bronchoscopy with endobronchial navigation (N/A, 02/27/2021); Nasal hemorrhage control (N/A, 05/13/2021); Back surgery; Colonoscopy (N/A, 04/30/2022); Esophagogastroduodenoscopy (N/A, 04/30/2022); Colonoscopy with propofol (N/A, 01/14/2023); and Esophagogastroduodenoscopy (egd) with propofol (N/A, 01/14/2023). Cassandra Brown has a current medication list which includes the following prescription(s): albuterol, bac, carisoprodol, cholecalciferol, clobetasol cream, dupixent, emgality, epinephrine, ferrous sulfate, folic acid, furosemide, gentamicin ointment, hydrocodone-acetaminophen, methotrexate, montelukast, naloxone, nurtec, pantoprazole, pentoxifylline, pimecrolimus, polyethylene glycol-electrolytes, pregabalin, promethazine, propranolol er, rosuvastatin, sodium chloride, sotalol, centrum performance, spiriva respimat, sucralfate, triamcinolone cream, and advair hfa, and the following Facility-Administered Medications: lactated ringers. Her primarily concern today is the Neck Pain (Shoulders bilateral fingers will go numb)  Initial Vital Signs:  Pulse/HCG Rate: (!) 51ECG Heart Rate: (!) 46 Temp: (!) 97.3 F (36.3 C) Resp: 16 BP: 122/71 SpO2: 99 %  BMI:  Estimated body mass index is 37.49 kg/m as calculated from the following:   Height as of this encounter: 5' 2"$  (1.575 m).   Weight as of this encounter: 205 lb (93 kg).  Risk Assessment: Allergies: Reviewed. She is allergic to cyclobenzaprine, ibuprofen, morphine, meperidine, sulfa antibiotics, and onion.  Allergy Precautions: None required Coagulopathies: Reviewed. None  identified.  Blood-thinner therapy: None at this time Active Infection(s): Reviewed. None identified. [name] is afebrile  Site Confirmation: Cassandra Brown was asked to confirm the procedure and laterality before marking the site Procedure checklist: Completed Consent: Before the procedure and under the influence of no sedative(s), amnesic(s), or anxiolytics, the patient was informed of the treatment options, risks and possible complications. To fulfill our ethical and legal obligations, as recommended by the American Medical Association's Code of Ethics, I have informed the patient of my clinical impression; the nature and purpose of the treatment or procedure; the risks, benefits, and possible complications of the intervention; the alternatives, including doing nothing; the risk(s) and benefit(s) of the alternative treatment(s) or procedure(s); and the risk(s) and benefit(s) of doing nothing. The patient was provided information about the general risks and possible complications associated with the procedure. These may include, but are not limited to: failure to achieve desired goals, infection, bleeding, organ or nerve damage, allergic reactions, paralysis, and death. In addition, the patient was informed of those risks and complications associated to the procedure, such as failure to decrease pain; infection; bleeding; organ or nerve damage with subsequent damage to sensory, motor, and/or autonomic systems, resulting in permanent pain, numbness, and/or weakness of one or several areas of the body; allergic reactions; (i.e.:  anaphylactic reaction); and/or death. Furthermore, the patient was informed of those risks and complications associated with the medications. These include, but are not limited to: allergic reactions (i.e.: anaphylactic or anaphylactoid reaction(s)); adrenal axis suppression; blood sugar elevation that in diabetics may result in ketoacidosis or comma; water retention that in patients with history of congestive heart failure may result in shortness of breath, pulmonary edema, and decompensation with resultant heart failure; weight gain; swelling or edema; medication-induced neural toxicity; particulate matter embolism and blood vessel occlusion with resultant organ, and/or nervous system infarction; and/or aseptic necrosis of one or more joints. Finally, the patient was informed that Medicine is not an exact science; therefore, there is also the possibility of unforeseen or unpredictable risks and/or possible complications that may result in a catastrophic outcome. The patient indicated having understood very clearly. We have given the patient no guarantees and we have made no promises. Enough time was given to the patient to ask questions, all of which were answered to the patient's satisfaction. Ms. Mahala has indicated that she wanted to continue with the procedure. Attestation: I, the ordering provider, attest that I have discussed with the patient the benefits, risks, side-effects, alternatives, likelihood of achieving goals, and potential problems during recovery for the procedure that I have provided informed consent. Date  Time: 02/16/2023  8:06 AM  Pre-Procedure Preparation:  Monitoring: As per clinic protocol. Respiration, ETCO2, SpO2, BP, heart rate and rhythm monitor placed and checked for adequate function Safety Precautions: Patient was assessed for positional comfort and pressure points before starting the procedure. Time-out: I initiated and conducted the "Time-out" before starting the  procedure, as per protocol. The patient was asked to participate by confirming the accuracy of the "Time Out" information. Verification of the correct person, site, and procedure were performed and confirmed by me, the nursing staff, and the patient. "Time-out" conducted as per Joint Commission's Universal Protocol (UP.01.01.01). Time: [phone]  Description/Narrative of Procedure:          Rationale (medical necessity): procedure needed and proper for the diagnosis and/or treatment of the patient's medical symptoms and needs. Procedural Technique Safety Precautions: Aspiration looking for blood return was conducted prior to all  injections. At no point did we inject any substances, as a needle was being advanced. No attempts were made at seeking any paresthesias. Safe injection practices and needle disposal techniques used. Medications properly checked for expiration dates. SDV (single dose vial) medications used. Description of the Procedure: Protocol guidelines were followed. The patient was assisted into a comfortable position. The target area was identified and the area prepped in the usual manner. Skin & deeper tissues infiltrated with local anesthetic. Appropriate amount of time allowed to pass for local anesthetics to take effect. The procedure needles were then advanced to the target area. Proper needle placement secured. Negative aspiration confirmed. Solution injected in intermittent fashion, asking for systemic symptoms every 0.5cc of injectate. The needles were then removed and the area cleansed, making sure to leave some of the prepping solution back to take advantage of its long term bactericidal properties.  8 cc solution made of 7 cc of 0.2% ropivacaine, 1 cc of methylprednisolone, 80 mg/cc.  4 cc injected into the left SI joint, 4 cc injected into the right SI joint.  A piriformis injection was also performed 1 cm inferior, 1 cm lateral and 1 cm deep to the inferior sacroiliac fissure with  contrast highlighting piriformis muscle striations.  4 cc of 0.2% ropivacaine was injected into the right piriformis, 4 cc of 0.2% ropivacaine was injected into the left piriformis muscle under fluoroscopy.  Patient denied any pain radiating down her right or left leg during injection.   Technical description of procedure:  Fluoroscopy using a posterior anterior 45 degree angle from the midline aiming at the anterolateral aspect of the patient was used to find a direct path into the sacroiliac joint, the superior medial to posterior superior iliac spine.  The skin was marked where the desired target and the skin infiltrated with local anesthetics.  The procedure needle was then advanced until the joint was entered.  Once inside of the joint, we then proceeded to inject the desired solution.  Vitals:   02/16/23 0854 02/16/23 0858 02/16/23 0902 02/16/23 0907  BP: 116/83 117/77 107/79 104/81  Pulse:      Resp: 13 12 18   $ Temp:    (!) 97 F (36.1 C)  SpO2: 99% 97% 98% 100%  Weight:      Height:        Start Time: 0836 hrs. End Time: 0900 hrs.  Imaging Guidance (Non-Spinal):          Type of Imaging Technique: Fluoroscopy Guidance (Non-Spinal) Indication(s): Assistance in needle guidance and placement for procedures requiring needle placement in or near specific anatomical locations not easily accessible without such assistance. Exposure Time: Please see nurses notes. Contrast: Before injecting any contrast, we confirmed that the patient did not have an allergy to iodine, shellfish, or radiological contrast. Once satisfactory needle placement was completed at the desired level, radiological contrast was injected. Contrast injected under live fluoroscopy. No contrast complications. See chart for type and volume of contrast used. Fluoroscopic Guidance: I was personally present during the use of fluoroscopy. "Tunnel Vision Technique" used to obtain the best possible view of the target area. Parallax  error corrected before commencing the procedure. "Direction-depth-direction" technique used to introduce the needle under continuous pulsed fluoroscopy. Once target was reached, antero-posterior, oblique, and lateral fluoroscopic projection used confirm needle placement in all planes. Images permanently stored in EMR. Interpretation: I personally interpreted the imaging intraoperatively. Adequate needle placement confirmed in multiple planes. Appropriate spread of contrast into desired area was  observed. No evidence of afferent or efferent intravascular uptake. Permanent images saved into the patient's record.  Post-operative Assessment:  Post-procedure Vital Signs:  Pulse/HCG Rate: (!) 51(!) 50 Temp: (!) 97 F (36.1 C) Resp: 18 BP: 104/81 SpO2: 100 %  EBL: None  Complications: No immediate post-treatment complications observed by team, or reported by patient.  Note: The patient tolerated the entire procedure well. A repeat set of vitals were taken after the procedure and the patient was kept under observation following institutional policy, for this type of procedure. Post-procedural neurological assessment was performed, showing return to baseline, prior to discharge. The patient was provided with post-procedure discharge instructions, including a section on how to identify potential problems. Should any problems arise concerning this procedure, the patient was given instructions to immediately contact us, at any time, without hesitation. In any case, we plan to contact the patient by telephone for a follow-up status report regarding this interventional procedure.  Comments:  No additional relevant information.  Plan of Care (POC)  Orders:  Orders Placed This Encounter  Procedures   DG PAIN CLIN[facility] C-ARM 1-60 MIN NO REPORT    Intraoperative interpretation by procedural physician at [place].    Standing Status:   Standing    Number of Occurrences:   1    Order Specific  Question:   Reason for exam:    Answer:   Assistance in needle guidance and placement for procedures requiring needle placement in or near specific anatomical locations not easily accessible without such assistance.     Medications ordered for procedure: Meds ordered this encounter  Medications   iohexol (OMNIPAQUE) 180 MG/ML injection 10 mL    Must be Myelogram-compatible. If not available, you may substitute with a water-soluble, non-ionic, hypoallergenic, myelogram-compatible radiological contrast medium.   lidocaine (XYLOCAINE) 2 % (with pres) injection 400 mg   dexamethasone (DECADRON) injection 10 mg   dexamethasone (DECADRON) injection 10 mg   methylPREDNISolone acetate (DEPO-MEDROL) injection 80 mg   ropivacaine (PF) 2 mg/mL (0.2%) (NAROPIN) injection 9 mL   ropivacaine (PF) 2 mg/mL (0.2%) (NAROPIN) injection 9 mL   lactated ringers infusion   midazolam (VERSED) injection 0.5-2 mg    Make sure Flumazenil is available in the pyxis when using this medication. If oversedation occurs, administer 0.2 mg IV over 15 sec. If after 45 sec no response, administer 0.2 mg again over 1 min; may repeat at 1 min intervals; not to exceed 4 doses (1 mg)   Medications administered: We administered iohexol, lidocaine, dexamethasone, dexamethasone, methylPREDNISolone acetate, ropivacaine (PF) 2 mg/mL (0.2%), ropivacaine (PF) 2 mg/mL (0.2%), lactated ringers, and midazolam.  See the medical record for exact dosing, route, and time of administration.  Follow-up plan:   Return in about 5 weeks (around 03/23/2023) for Post Procedure Evaluation, in person.       Recent Visits Date Type Provider Dept  01/10/23 Office Visit Gillis Santa, MD [facility]  Showing recent visits within past 90 days and meeting all other requirements Today's Visits Date Type Provider Dept  02/16/23 Procedure visit Gillis Santa, MD [facility]  Showing today's visits and meeting all other  requirements Future Appointments Date Type Provider Dept  03/23/23 Appointment Gillis Santa, MD [facility]  Showing future appointments within next 90 days and meeting all other requirements  Disposition: Discharge home  Discharge (Date  Time): 02/16/2023; 0910 hrs.   Primary Care Physician: Baxter Hire, MD Location: [facility] Note by:  Gillis Santa, MD (TTS technology used. I apologize for any typographical errors that were not detected and corrected.) Date: 02/16/2023; Time: 9:26 AM  Disclaimer:  Medicine is not an Chief Strategy Officer. The only guarantee in medicine is that nothing is guaranteed. It is important to note that the decision to proceed with this intervention was based on the information collected from the patient. The Data and conclusions were drawn from the patient's questionnaire, the interview, and the physical examination. Because the information was provided in large part by the patient, it cannot be guaranteed that it has not been purposely or unconsciously manipulated. Every effort has been made to obtain as much relevant data as possible for this evaluation. It is important to note that the conclusions that lead to this procedure are derived in large part from the available data. Always take into account that the treatment will also be dependent on availability of resources and existing treatment guidelines, considered by other Pain Management Practitioners as being common knowledge and practice, at the time of the intervention. For Medico-Legal purposes, it is also important to point out that variation in procedural techniques and pharmacological choices are the acceptable norm. The indications, contraindications, technique, and results of the above procedure should only be interpreted and judged by a Board-Certified Interventional Pain Specialist with extensive familiarity and expertise in the same exact procedure and technique.

## 2023-02-16 NOTE — Progress Notes (Signed)
Safety precautions to be maintained throughout the outpatient stay will include: orient to surroundings, keep bed in low position, maintain call bell within reach at all times, provide assistance with transfer out of bed and ambulation.  

## 2023-02-16 NOTE — Progress Notes (Signed)
PROVIDER NOTE: Interpretation of information contained herein should be left to medically-trained personnel. Specific patient instructions are provided elsewhere under "Patient Instructions" section of medical record. This document was created in part using STT-dictation technology, any transcriptional errors that may result from this process are unintentional.  Patient: Cassandra Brown Type: Established DOB: December 21, 1966 MRN: [id number] PCP: Baxter Hire, MD  Service: Procedure DOS: 02/16/2023 Setting: Ambulatory Location: Ambulatory outpatient facility Delivery: Face-to-face Provider: Gillis Santa, MD Specialty: Interventional Pain Management Specialty designation: 09 Location: Outpatient facility Ref. Prov.: Gillis Santa, MD       Interventional Therapy   Procedure: Cervical Facet Medial Branch Block(s) #1  Laterality: Bilateral  Level: C3, C4, and C5 Medial Branch Level(s). Injecting these levels blocks the C3-4 and C4-5 cervical facet joints.  Imaging: Fluoroscopic guidance Anesthesia: Local anesthesia (1-2% Lidocaine) Anxiolysis: IV Versed 1.5 mg DOS: 02/16/2023  Performed by: Gillis Santa, MD  Purpose: Diagnostic/Therapeutic Indications: Cervicalgia (cervical spine axial pain) severe enough to impact quality of life or function. 1. Cervical facet joint syndrome   2. Degenerative cervical disc   3. Chronic pain syndrome    NAS-11 Pain score:   Pre-procedure: 7 /10   Post-procedure:  (neck 5/10  back  7/10   piriformis 6/10)/10     Position / Prep / Materials:  Position: Prone. Head in cradle. C-spine slightly flexed. Prep solution: DuraPrep (Iodine Povacrylex [0.7% available iodine] and Isopropyl Alcohol, 74% w/w) Prep Area: Posterior Cervico-thoracic Region. From occipital ridge to tip of scapula, and from shoulder to shoulder. Entire posterior and lateral neck surface. Materials:  Tray: Block Needle(s):  Type: Spinal  Gauge (G): 22"  Length: 3.5-in  Qty:  2       Pre-op H&P Assessment:  Cassandra Brown is a 57 y.o. (year old), female patient, seen today for interventional treatment. She  has a past surgical history that includes Joint replacement (Right); Knee surgery (Left); Shoulder surgery (Bilateral); Appendectomy; Cholecystectomy; Thoracic laminectomy for spinal cord stimulator (Bilateral, 06/23/2020); Pulse generator implant (Left, 06/23/2020); Video bronchoscopy with endobronchial ultrasound (N/A, 02/27/2021); Video bronchoscopy with endobronchial navigation (N/A, 02/27/2021); Nasal hemorrhage control (N/A, 05/13/2021); Back surgery; Colonoscopy (N/A, 04/30/2022); Esophagogastroduodenoscopy (N/A, 04/30/2022); Colonoscopy with propofol (N/A, 01/14/2023); and Esophagogastroduodenoscopy (egd) with propofol (N/A, 01/14/2023). Cassandra Brown has a current medication list which includes the following prescription(s): albuterol, bac, carisoprodol, cholecalciferol, clobetasol cream, dupixent, emgality, epinephrine, ferrous sulfate, folic acid, furosemide, gentamicin ointment, hydrocodone-acetaminophen, methotrexate, montelukast, naloxone, nurtec, pantoprazole, pentoxifylline, pimecrolimus, polyethylene glycol-electrolytes, pregabalin, promethazine, propranolol er, rosuvastatin, sodium chloride, sotalol, centrum performance, spiriva respimat, sucralfate, triamcinolone cream, and advair hfa, and the following Facility-Administered Medications: lactated ringers. Her primarily concern today is the Neck Pain (Shoulders bilateral fingers will go numb)  Initial Vital Signs:  Pulse/HCG Rate: (!) 51ECG Heart Rate: (!) 46 Temp: (!) 97.3 F (36.3 C) Resp: 16 BP: 122/71 SpO2: 99 %  BMI: Estimated body mass index is 37.49 kg/m as calculated from the following:   Height as of this encounter: 5' 2"$  (1.575 m).   Weight as of this encounter: 205 lb (93 kg).  Risk Assessment: Allergies: Reviewed. She is allergic to cyclobenzaprine, ibuprofen, morphine, meperidine, sulfa  antibiotics, and onion.  Allergy Precautions: None required Coagulopathies: Reviewed. None identified.  Blood-thinner therapy: None at this time Active Infection(s): Reviewed. None identified. Cassandra Brown is afebrile  Site Confirmation: Cassandra Brown was asked to confirm the procedure and laterality before marking the site Procedure checklist: Completed Consent: Before the procedure and under the influence of no sedative(s), amnesic(s),  or anxiolytics, the patient was informed of the treatment options, risks and possible complications. To fulfill our ethical and legal obligations, as recommended by the American Medical Association's Code of Ethics, I have informed the patient of my clinical impression; the nature and purpose of the treatment or procedure; the risks, benefits, and possible complications of the intervention; the alternatives, including doing nothing; the risk(s) and benefit(s) of the alternative treatment(s) or procedure(s); and the risk(s) and benefit(s) of doing nothing. The patient was provided information about the general risks and possible complications associated with the procedure. These may include, but are not limited to: failure to achieve desired goals, infection, bleeding, organ or nerve damage, allergic reactions, paralysis, and death. In addition, the patient was informed of those risks and complications associated to Spine-related procedures, such as failure to decrease pain; infection (i.e.: Meningitis, epidural or intraspinal abscess); bleeding (i.e.: epidural hematoma, subarachnoid hemorrhage, or any other type of intraspinal or peri-dural bleeding); organ or nerve damage (i.e.: Any type of peripheral nerve, nerve root, or spinal cord injury) with subsequent damage to sensory, motor, and/or autonomic systems, resulting in permanent pain, numbness, and/or weakness of one or several areas of the body; allergic reactions; (i.e.: anaphylactic reaction); and/or death. Furthermore,  the patient was informed of those risks and complications associated with the medications. These include, but are not limited to: allergic reactions (i.e.: anaphylactic or anaphylactoid reaction(s)); adrenal axis suppression; blood sugar elevation that in diabetics may result in ketoacidosis or comma; water retention that in patients with history of congestive heart failure may result in shortness of breath, pulmonary edema, and decompensation with resultant heart failure; weight gain; swelling or edema; medication-induced neural toxicity; particulate matter embolism and blood vessel occlusion with resultant organ, and/or nervous system infarction; and/or aseptic necrosis of one or more joints. Finally, the patient was informed that Medicine is not an exact science; therefore, there is also the possibility of unforeseen or unpredictable risks and/or possible complications that may result in a catastrophic outcome. The patient indicated having understood very clearly. We have given the patient no guarantees and we have made no promises. Enough time was given to the patient to ask questions, all of which were answered to the patient's satisfaction. Ms. Antrim has indicated that she wanted to continue with the procedure. Attestation: I, the ordering provider, attest that I have discussed with the patient the benefits, risks, side-effects, alternatives, likelihood of achieving goals, and potential problems during recovery for the procedure that I have provided informed consent. Date  Time: 02/16/2023  8:06 AM   Pre-Procedure Preparation:  Monitoring: As per clinic protocol. Respiration, ETCO2, SpO2, BP, heart rate and rhythm monitor placed and checked for adequate function Safety Precautions: Patient was assessed for positional comfort and pressure points before starting the procedure. Time-out: I initiated and conducted the "Time-out" before starting the procedure, as per protocol. The patient was asked to  participate by confirming the accuracy of the "Time Out" information. Verification of the correct person, site, and procedure were performed and confirmed by me, the nursing staff, and the patient. "Time-out" conducted as per Joint Commission's Universal Protocol (UP.01.01.01). Time: [id number]  Description/Narrative of Procedure:          Laterality: Bilateral. The procedure was performed in identical fashion on both sides. Targeted Levels:  C3, C4, and C5 Medial Branch Level(s).  Rationale (medical necessity): procedure needed and proper for the diagnosis and/or treatment of the patient's medical symptoms and needs. Procedural Technique Safety Precautions: Aspiration looking for  blood return was conducted prior to all injections. At no point did we inject any substances, as a needle was being advanced. No attempts were made at seeking any paresthesias. Safe injection practices and needle disposal techniques used. Medications properly checked for expiration dates. SDV (single dose vial) medications used. Description of the Procedure: Protocol guidelines were followed. The patient was assisted into a comfortable position. The target area was identified and the area prepped in the usual manner. Skin & deeper tissues infiltrated with local anesthetic. Appropriate amount of time allowed to pass for local anesthetics to take effect. The procedure needles were then advanced to the target area. Proper needle placement secured. Negative aspiration confirmed. Solution injected in intermittent fashion, asking for systemic symptoms every 0.5cc of injectate. The needles were then removed and the area cleansed, making sure to leave some of the prepping solution back to take advantage of its long term bactericidal properties.  Technical description of process:   C3 Medial Branch Nerve Block (MBB): The target area for the C3 dorsal medial articular branch is the lateral concave waist of the articular pillar of C3. Under  fluoroscopic guidance, a Quincke needle was inserted until contact was made with os over the postero-lateral aspect of the articular pillar of C3 (target area). After negative aspiration for blood, 23m of the nerve block solution was injected without difficulty or complication. The needle was removed intact. C4 Medial Branch Nerve Block (MBB): The target area for the C4 dorsal medial articular branch is the lateral concave waist of the articular pillar of C4. Under fluoroscopic guidance, a Quincke needle was inserted until contact was made with os over the postero-lateral aspect of the articular pillar of C4 (target area). After negative aspiration for blood, 1 mL of the nerve block solution was injected without difficulty or complication. The needle was removed intact. C5 Medial Branch Nerve Block (MBB): The target area for the C5 dorsal medial articular branch is the lateral concave waist of the articular pillar of C5. Under fluoroscopic guidance, a Quincke needle was inserted until contact was made with os over the postero-lateral aspect of the articular pillar of C5 (target area). After negative aspiration for blood, 118mof the nerve block solution was injected without difficulty or complication. The needle was removed intact.  6 cc solution made of 4 cc of 0.2% ropivacaine, 2 cc of Decadron 10 mg/cc.  1 cc injected at each level above bilaterally.  Once the entire procedure was completed, the treated area was cleaned, making sure to leave some of the prepping solution back to take advantage of its long term bactericidal properties.  Anatomy Reference Guide:       Vitals:   02/16/23 0854 02/16/23 0858 02/16/23 0902 02/16/23 0907  BP: 116/83 117/77 107/79 104/81  Pulse:      Resp: 13 12 18   $ Temp:    (!) 97 F (36.1 C)  SpO2: 99% 97% 98% 100%  Weight:      Height:         Start Time: 0836 hrs. End Time: 0900 hrs.  Imaging Guidance (Spinal):          Type of Imaging Technique:  Fluoroscopy Guidance (Spinal) Indication(s): Assistance in needle guidance and placement for procedures requiring needle placement in or near specific anatomical locations not easily accessible without such assistance. Exposure Time: Please see nurses notes. Contrast: None used. Fluoroscopic Guidance: I was personally present during the use of fluoroscopy. "Tunnel Vision Technique" used to obtain the  best possible view of the target area. Parallax error corrected before commencing the procedure. "Direction-depth-direction" technique used to introduce the needle under continuous pulsed fluoroscopy. Once target was reached, antero-posterior, oblique, and lateral fluoroscopic projection used confirm needle placement in all planes. Images permanently stored in EMR. Interpretation: No contrast injected. I personally interpreted the imaging intraoperatively. Adequate needle placement confirmed in multiple planes. Permanent images saved into the patient's record.  Post-operative Assessment:  Post-procedure Vital Signs:  Pulse/HCG Rate: (!) 51(!) 50 Temp: (!) 97 F (36.1 C) Resp: 18 BP: 104/81 SpO2: 100 %  EBL: None  Complications: No immediate post-treatment complications observed by team, or reported by patient.  Note: The patient tolerated the entire procedure well. A repeat set of vitals were taken after the procedure and the patient was kept under observation following institutional policy, for this type of procedure. Post-procedural neurological assessment was performed, showing return to baseline, prior to discharge. The patient was provided with post-procedure discharge instructions, including a section on how to identify potential problems. Should any problems arise concerning this procedure, the patient was given instructions to immediately contact us, at any time, without hesitation. In any case, we plan to contact the patient by telephone for a follow-up status report regarding this  interventional procedure.  Comments:  No additional relevant information.  Plan of Care (POC)  Orders:  Orders Placed This Encounter  Procedures   DG PAIN [facility] C-ARM 1-60 MIN NO REPORT    Intraoperative interpretation by procedural physician at [place].    Standing Status:   Standing    Number of Occurrences:   1    Order Specific Question:   Reason for exam:    Answer:   Assistance in needle guidance and placement for procedures requiring needle placement in or near specific anatomical locations not easily accessible without such assistance.   Medications ordered for procedure: Meds ordered this encounter  Medications   iohexol (OMNIPAQUE) 180 MG/ML injection 10 mL    Must be Myelogram-compatible. If not available, you may substitute with a water-soluble, non-ionic, hypoallergenic, myelogram-compatible radiological contrast medium.   lidocaine (XYLOCAINE) 2 % (with pres) injection 400 mg   dexamethasone (DECADRON) injection 10 mg   dexamethasone (DECADRON) injection 10 mg   methylPREDNISolone acetate (DEPO-MEDROL) injection 80 mg   ropivacaine (PF) 2 mg/mL (0.2%) (NAROPIN) injection 9 mL   ropivacaine (PF) 2 mg/mL (0.2%) (NAROPIN) injection 9 mL   lactated ringers infusion   midazolam (VERSED) injection 0.5-2 mg    Make sure Flumazenil is available in the pyxis when using this medication. If oversedation occurs, administer 0.2 mg IV over 15 sec. If after 45 sec no response, administer 0.2 mg again over 1 min; may repeat at 1 min intervals; not to exceed 4 doses (1 mg)   Medications administered: We administered iohexol, lidocaine, dexamethasone, dexamethasone, methylPREDNISolone acetate, ropivacaine (PF) 2 mg/mL (0.2%), ropivacaine (PF) 2 mg/mL (0.2%), lactated ringers, and midazolam.  See the medical record for exact dosing, route, and time of administration.  Follow-up plan:   Return in about 5 weeks (around 03/23/2023) for Post Procedure Evaluation, in person.        Recent Visits Date Type Provider Dept  01/10/23 Office Visit Gillis Santa, MD [facility]  Showing recent visits within past 90 days and meeting all other requirements Today's Visits Date Type Provider Dept  02/16/23 Procedure visit Gillis Santa, MD [facility]  Showing today's visits and meeting all other requirements Future Appointments Date Type  Provider Dept  03/23/23 Appointment Gillis Santa, MD [facility]  Showing future appointments within next 90 days and meeting all other requirements  Disposition: Discharge home  Discharge (Date  Time): 02/16/2023; 0910 hrs.   Primary Care Physician: Baxter Hire, MD Location: [facility] Note by: Gillis Santa, MD (TTS technology used. I apologize for any typographical errors that were not detected and corrected.) Date: 02/16/2023; Time: 9:27 AM  Disclaimer:  Medicine is not an Chief Strategy Officer. The only guarantee in medicine is that nothing is guaranteed. It is important to note that the decision to proceed with this intervention was based on the information collected from the patient. The Data and conclusions were drawn from the patient's questionnaire, the interview, and the physical examination. Because the information was provided in large part by the patient, it cannot be guaranteed that it has not been purposely or unconsciously manipulated. Every effort has been made to obtain as much relevant data as possible for this evaluation. It is important to note that the conclusions that lead to this procedure are derived in large part from the available data. Always take into account that the treatment will also be dependent on availability of resources and existing treatment guidelines, considered by other Pain Management Practitioners as being common knowledge and practice, at the time of the intervention. For Medico-Legal purposes, it is also important to point out that  variation in procedural techniques and pharmacological choices are the acceptable norm. The indications, contraindications, technique, and results of the above procedure should only be interpreted and judged by a Board-Certified Interventional Pain Specialist with extensive familiarity and expertise in the same exact procedure and technique.

## 2023-02-16 NOTE — Patient Instructions (Signed)

## 2023-02-17 ENCOUNTER — Telehealth: Payer: Self-pay

## 2023-02-17 NOTE — Telephone Encounter (Signed)
Post procedure follow up.  LM 

## 2023-03-23 ENCOUNTER — Ambulatory Visit: Payer: Medicare Other | Admitting: Student in an Organized Health Care Education/Training Program

## 2023-03-29 ENCOUNTER — Ambulatory Visit
Payer: Medicare Other | Attending: Student in an Organized Health Care Education/Training Program | Admitting: Student in an Organized Health Care Education/Training Program

## 2023-03-29 ENCOUNTER — Encounter: Payer: Self-pay | Admitting: Student in an Organized Health Care Education/Training Program

## 2023-03-29 VITALS — BP 104/61 | HR 63 | Temp 97.1°F | Resp 17 | Ht 62.0 in | Wt 198.0 lb

## 2023-03-29 DIAGNOSIS — M5030 Other cervical disc degeneration, unspecified cervical region: Secondary | ICD-10-CM | POA: Diagnosis not present

## 2023-03-29 DIAGNOSIS — M533 Sacrococcygeal disorders, not elsewhere classified: Secondary | ICD-10-CM | POA: Diagnosis not present

## 2023-03-29 DIAGNOSIS — M47812 Spondylosis without myelopathy or radiculopathy, cervical region: Secondary | ICD-10-CM | POA: Diagnosis present

## 2023-03-29 DIAGNOSIS — G894 Chronic pain syndrome: Secondary | ICD-10-CM | POA: Diagnosis present

## 2023-03-29 DIAGNOSIS — M461 Sacroiliitis, not elsewhere classified: Secondary | ICD-10-CM

## 2023-03-29 DIAGNOSIS — G5703 Lesion of sciatic nerve, bilateral lower limbs: Secondary | ICD-10-CM | POA: Diagnosis present

## 2023-03-29 DIAGNOSIS — M47818 Spondylosis without myelopathy or radiculopathy, sacral and sacrococcygeal region: Secondary | ICD-10-CM

## 2023-03-29 DIAGNOSIS — G8929 Other chronic pain: Secondary | ICD-10-CM | POA: Diagnosis present

## 2023-03-29 NOTE — Progress Notes (Signed)
Safety precautions to be maintained throughout the outpatient stay will include: orient to surroundings, keep bed in low position, maintain call bell within reach at all times, provide assistance with transfer out of bed and ambulation.  

## 2023-03-29 NOTE — Progress Notes (Signed)
PROVIDER NOTE: Information contained herein reflects review and annotations entered in association with encounter. Interpretation of such information and data should be left to medically-trained personnel. Information provided to patient can be located elsewhere in the medical record under "Patient Instructions". Document created using STT-dictation technology, any transcriptional errors that may result from process are unintentional.    Patient: Cassandra Brown  Service Category: E/M  Provider: Gillis Santa, MD  DOB: November 23, 1966  DOS: 03/29/2023  Referring Provider: Baxter Hire, MD  MRN: [id number]  Specialty: Interventional Pain Management  PCP: Baxter Hire, MD  Type: Established Patient  Setting: Ambulatory outpatient    Location: Office  Delivery: Face-to-face     HPI  Cassandra Brown, a 57 y.o. 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 Cassandra Brown primary complain today is Back Pain (lower) and Neck Pain Last encounter: My last encounter with her was on 02/16/22 Pertinent problems: Cassandra Brown has Chronic pain syndrome; Chronic sacroiliac joint pain; Lumbar radiculopathy, chronic; Spinal stenosis of lumbar region without neurogenic claudication; Status post total right knee replacement; Osteoarthritis of multiple joints; Lumbar facet arthropathy; SI joint arthritis; Chronic, continuous use of opioids; Lumbosacral radiculopathy at L5 (left); Protrusion of thoracic intervertebral disc; and Spinal cord stimulator status ([employer], implant: Dr Lacinda Axon) on their pertinent problem list. Pain Assessment: Severity of Chronic pain is reported as a 6 /10. Location: Back Lower/denies; neck pain sometimes goes into shoulders bilat. Onset: More than a month ago. Quality: Aching, Burning, Sharp, Nagging. Timing: Constant. Modifying factor(s): procedures, rest. Vitals:  height is 5\' 2"  (1.575 m) and weight is 198 lb (89.8 kg). Her temporal temperature is 97.1  F (36.2 C) (abnormal). Her blood pressure is 104/61 and her pulse is 63. Her respiration is 17 and oxygen saturation is 96%.  BMI: Estimated body mass index is 36.21 kg/m as calculated from the following:   Height as of this encounter: 5\' 2"  (1.575 m).   Weight as of this encounter: 198 lb (89.8 kg).  Reason for encounter:    Post-procedure evaluation   Procedure 1: Sacroiliac Joint Steroid Injection #1   and bilateral piriformis trigger point injection Laterality: Bilateral     Level: PSIS (Posterior Inferior Iliac Spine)  Imaging: Fluoroscopic guidance Anesthesia: Local anesthesia (1-2% Lidocaine) Anxiolysis: IV Versed 1.5 mg Sedation: Minimal Sedation                       DOS: 02/16/2023  Performed by: Gillis Santa, MD  Procedure 2: Procedure: Cervical Facet Medial Branch Block(s) #1  Laterality: Bilateral  Level: C3, C4, and C5 Medial Branch Level(s). Injecting these levels blocks the C3-4 and C4-5 cervical facet joints.  Imaging: Fluoroscopic guidance Anesthesia: Local anesthesia (1-2% Lidocaine) Anxiolysis: IV Versed 1.5 mg DOS: 02/16/2023  Performed by: Gillis Santa, MD       Effectiveness:  Initial hour after procedure: 100 % (all procedures)  Subsequent 4-6 hours post-procedure: 100 %  Analgesia past initial 6 hours: 55 % (SIJ 55%; PIRIFORMIS 55%; CFB 70%)  Ongoing improvement:  Analgesic:  50% in SI-J& Piriformis, >70% for cervical spine pain Function: Cassandra Brown reports improvement in function ROM: Cassandra Brown reports improvement in ROM   ROS  Constitutional: Denies any fever or chills Gastrointestinal: No reported hemesis, hematochezia, vomiting, or acute GI distress Musculoskeletal:  Bilateral buttock, SI joint, piriformis pain, cervicalgia , significantly improved from before Neurological: No reported episodes of acute onset apraxia, aphasia,  dysarthria, agnosia, amnesia, paralysis, loss of coordination, or loss of consciousness  Medication Review   Centrum Performance, Dupilumab, EPINEPHrine, Galcanezumab-gnlm, HYDROcodone-acetaminophen, Rimegepant Sulfate, Tiotropium Bromide Monohydrate, albuterol, butalbital-acetaminophen-caffeine, carisoprodol, cholecalciferol, clobetasol cream, ferrous sulfate, fluticasone-salmeterol, folic acid, furosemide, gentamicin ointment, methotrexate, montelukast, naloxone, pantoprazole, pentoxifylline, pimecrolimus, polyethylene glycol-electrolytes, pregabalin, promethazine, propranolol ER, rosuvastatin, sodium chloride, sotalol, sucralfate, and triamcinolone cream  History Review  Allergy: Cassandra Brown is allergic to cyclobenzaprine, ibuprofen, morphine, meperidine, sulfa antibiotics, and onion. Drug: Cassandra Brown  reports no history of drug use. Alcohol:  reports that she does not currently use alcohol. Tobacco:  reports that she quit smoking about 12 years ago. Her smoking use included cigarettes. She has a 99.00 pack-year smoking history. She has never used smokeless tobacco. Social: Cassandra Brown  reports that she quit smoking about 12 years ago. Her smoking use included cigarettes. She has a 99.00 pack-year smoking history. She has never used smokeless tobacco. She reports that she does not currently use alcohol. She reports that she does not use drugs. Medical:  has a past medical history of Allergic rhinitis due to pollen, Angina pectoris, Atrial fibrillation, CHF (congestive heart failure), Chronic maxillary sinusitis, Chronic pain syndrome, Chronic right sacroiliac joint pain, Collagen vascular disease, Coronary artery abnormality, Dysrhythmia, Gastroparesis, GERD (gastroesophageal reflux disease), History of GI bleed, History of kidney stones, Leakage of Watchman left atrial appendage closure device, Leukocytosis, Obesity, Patent foramen ovale, Rheumatoid arthritis, Sleep apnea, Spinal stenosis of lumbar region, Unequal leg length, Vasculitis, and Wegener's disease, pulmonary. Surgical: Cassandra Brown  has a past  surgical history that includes Joint replacement (Right); Knee surgery (Left); Shoulder surgery (Bilateral); Appendectomy; Cholecystectomy; Thoracic laminectomy for spinal cord stimulator (Bilateral, 06/23/2020); Pulse generator implant (Left, 06/23/2020); Video bronchoscopy with endobronchial ultrasound (N/A, 02/27/2021); Video bronchoscopy with endobronchial navigation (N/A, 02/27/2021); Nasal hemorrhage control (N/A, 05/13/2021); Back surgery; Colonoscopy (N/A, 04/30/2022); Esophagogastroduodenoscopy (N/A, 04/30/2022); Colonoscopy with propofol (N/A, 01/14/2023); and Esophagogastroduodenoscopy (egd) with propofol (N/A, 01/14/2023). Family: family history is not on file.  Laboratory Chemistry Profile   Renal Lab Results  Component Value Date   BUN 11 01/12/2022   CREATININE 0.65 01/12/2022   GFRAA >60 06/19/2020   GFRNONAA >60 01/12/2022    Hepatic Lab Results  Component Value Date   AST 25 01/10/2022   ALT 25 01/10/2022   ALBUMIN 2.3 (L) 01/10/2022   ALKPHOS 47 01/10/2022   LIPASE 58 (H) 01/04/2022    Electrolytes Lab Results  Component Value Date   NA 144 01/12/2022   K 4.5 01/12/2022   CL 103 01/12/2022   CALCIUM 8.9 01/12/2022   MG 2.0 01/10/2022   PHOS 3.3 01/11/2022    Bone No results found for: "VD25OH", "VD125OH2TOT", "[id number]", "UK:060616", "25OHVITD1", "25OHVITD2", "25OHVITD3", "TESTOFREE", "TESTOSTERONE"  Inflammation (CRP: Acute Phase) (ESR: Chronic Phase) Lab Results  Component Value Date   CRP 6.2 (H) 01/12/2022   LATICACIDVEN 1.3 01/05/2022         Note: Above Lab results reviewed.  Physical Exam  General appearance: Well nourished, well developed, and well hydrated. In no apparent acute distress Mental status: Alert, oriented x 3 (person, place, & time)       Respiratory: No evidence of acute respiratory distress Eyes: PERLA Vitals: BP 104/61   Pulse 63   Temp (!) 97.1 F (36.2 C) (Temporal)   Resp 17   Ht 5\' 2"  (1.575 m)   Wt 198 lb (89.8 kg)    SpO2 96%   BMI 36.21 kg/m  BMI: Estimated body mass index is 36.21  kg/m as calculated from the following:   Height as of this encounter: 5\' 2"  (1.575 m).   Weight as of this encounter: 198 lb (89.8 kg). Ideal: Ideal body weight: 50.1 kg (110 lb 7.2 oz) Adjusted ideal body weight: 66 kg (145 lb 7.5 oz)  Cervical Spine Area Exam  Skin & Axial Inspection: No masses, redness, edema, swelling, or associated skin lesions Alignment: Symmetrical Functional ROM: Pain restricted ROM, bilaterally, improved after treatment Stability: No instability detected Muscle Tone/Strength: Functionally intact. No obvious neuro-muscular anomalies detected. Sensory (Neurological): Musculoskeletal pain pattern, secondary to cervical facets, improved after procedure Palpation: Complains of area being tender to palpation Positive provocative maneuver for for cervical facet disease Upper Extremity (UE) Exam    Side: Right upper extremity  Side: Left upper extremity  Skin & Extremity Inspection: Skin color, temperature, and hair growth are WNL. No peripheral edema or cyanosis. No masses, redness, swelling, asymmetry, or associated skin lesions. No contractures.  Skin & Extremity Inspection: Skin color, temperature, and hair growth are WNL. No peripheral edema or cyanosis. No masses, redness, swelling, asymmetry, or associated skin lesions. No contractures.  Functional ROM: Unrestricted ROM          Functional ROM: Unrestricted ROM          Muscle Tone/Strength: Functionally intact. No obvious neuro-muscular anomalies detected.  Muscle Tone/Strength: Functionally intact. No obvious neuro-muscular anomalies detected.  Sensory (Neurological): Unimpaired          Sensory (Neurological): Unimpaired          Palpation: No palpable anomalies              Palpation: No palpable anomalies              Provocative Test(s):  Phalen's test: deferred Tinel's test: deferred Apley's scratch test (touch opposite shoulder):  Action 1  (Across chest): deferred Action 2 (Overhead): deferred Action 3 (LB reach): deferred   Provocative Test(s):  Phalen's test: deferred Tinel's test: deferred Apley's scratch test (touch opposite shoulder):  Action 1 (Across chest): deferred Action 2 (Overhead): deferred Action 3 (LB reach): deferred    Lumbar Spine Area Exam  Skin & Axial Inspection: Well healed scar from previous spine surgery detected Alignment: Symmetrical Functional ROM: Pain restricted ROM      primarily with lumbar extension Stability: No instability detected Muscle Tone/Strength: Functionally intact. No obvious neuro-muscular anomalies detected. Sensory (Neurological): Musculoskeletal pain pattern likely referred from SI joint Palpation: No palpable anomalies       Provocative Tests:  Patrick's Maneuver: (+) for bilateral S-I arthralgia             FABER* test:(+) for bilateral S-I arthralgia   S-I anterior distraction/compression test: (+) for bilateral S-I arthralgia  S-I lateral compression test: (+) for bilateral S-I arthralgia  S-I Thigh-thrust test:(+) for bilateral S-I arthralgia  S-I Gaenslen's test: (+) for bilateral S-I arthralgia  *(Flexion, ABduction and External Rotation) Gait & Posture Assessment  Ambulation: Unassisted Gait: Relatively normal for age and body habitus Posture: WNL  Lower Extremity Exam    Side: Right lower extremity  Side: Left lower extremity  Stability: No instability observed          Stability: No instability observed          Skin & Extremity Inspection: Skin color, temperature, and hair growth are WNL. No peripheral edema or cyanosis. No masses, redness, swelling, asymmetry, or associated skin lesions. No contractures.  Skin & Extremity Inspection: Skin color, temperature, and hair  growth are WNL. No peripheral edema or cyanosis. No masses, redness, swelling, asymmetry, or associated skin lesions. No contractures.  Functional ROM: Unrestricted ROM                   Functional ROM: Unrestricted ROM                  Muscle Tone/Strength: Functionally intact. No obvious neuro-muscular anomalies detected.  Muscle Tone/Strength: Functionally intact. No obvious neuro-muscular anomalies detected.  Sensory (Neurological): Referred pain pattern        Sensory (Neurological): Referred pain pattern        DTR: Patellar: deferred today Achilles: deferred today Plantar: deferred today  DTR: Patellar: deferred today Achilles: deferred today Plantar: deferred today  Palpation: No palpable anomalies  Palpation: No palpable anomalies    Assessment   Diagnosis Status  1. Cervical facet joint syndrome   2. Degenerative cervical disc   3. Chronic sacroiliac joint pain   4. SI joint arthritis   5. Piriformis syndrome of both sides   6. Chronic pain syndrome     Improved Persistent Improved   Updated Problems: No problems updated.   Plan of Care     1. Cervical facet joint syndrome  2. Degenerative cervical disc  3. Chronic sacroiliac joint pain  4. SI joint arthritis  5. Piriformis syndrome of both sides  6. Chronic pain syndrome   Excellent pain relief after C-Fcts and SI-J/piriformis, endorses improvement in ROM. Has been more active and has lost weight as well. Continue to monitor, repeat as needed Discussed Cervical RFA with patient if she has 2 positive diagnostic cervical facet blocks  Orders:  No orders of the defined types were placed in this encounter.  Follow-up plan:   Return if symptoms worsen or fail to improve.    Recent Visits Date Type Provider Dept  02/16/23 Procedure visit Gillis Santa, MD [facility]  01/10/23 Office Visit Gillis Santa, MD [facility]  Showing recent visits within past 90 days and meeting all other requirements Today's Visits Date Type Provider Dept  03/29/23 Office Visit Gillis Santa, MD [facility]  Showing today's visits and meeting all other  requirements Future Appointments No visits were found meeting these conditions. Showing future appointments within next 90 days and meeting all other requirements  I discussed the assessment and treatment plan with the patient. The patient was provided an opportunity to ask questions and all were answered. The patient agreed with the plan and demonstrated an understanding of the instructions.  Patient advised to call back or seek an in-person evaluation if the symptoms or condition worsens.  Duration of encounter: 25minutes.  Total time on encounter, as per AMA guidelines included both the face-to-face and non-face-to-face time personally spent by the physician and/or other qualified health care professional(s) on the day of the encounter (includes time in activities that require the physician or other qualified health care professional and does not include time in activities normally performed by clinical staff). Physician's time may include the following activities when performed: Preparing to see the patient (e.g., pre-charting review of records, searching for previously ordered imaging, lab work, and nerve conduction tests) Review of prior analgesic pharmacotherapies. Reviewing PMP Interpreting ordered tests (e.g., lab work, imaging, nerve conduction tests) Performing post-procedure evaluations, including interpretation of diagnostic procedures Obtaining and/or reviewing separately obtained history Performing a medically appropriate examination and/or evaluation Counseling and educating the patient/family/caregiver Ordering medications, tests, or procedures Referring and communicating with  other health care professionals (when not separately reported) Documenting clinical information in the electronic or other health record Independently interpreting results (not separately reported) and communicating results to the patient/ family/caregiver Care coordination (not separately  reported)  Note by: Gillis Santa, MD Date: 03/29/2023; Time: 2:06 PM

## 2023-04-19 DIAGNOSIS — R002 Palpitations: Secondary | ICD-10-CM | POA: Insufficient documentation

## 2023-07-13 ENCOUNTER — Other Ambulatory Visit: Payer: Self-pay | Admitting: Internal Medicine

## 2023-07-13 DIAGNOSIS — Z1231 Encounter for screening mammogram for malignant neoplasm of breast: Secondary | ICD-10-CM

## 2023-07-20 ENCOUNTER — Ambulatory Visit
Admission: RE | Admit: 2023-07-20 | Discharge: 2023-07-20 | Disposition: A | Discharge status: Home / Self Care | Payer: Medicare Other | Source: Ambulatory Visit | Attending: Internal Medicine | Admitting: Internal Medicine

## 2023-07-20 DIAGNOSIS — Z1231 Encounter for screening mammogram for malignant neoplasm of breast: Secondary | ICD-10-CM | POA: Diagnosis not present

## 2023-08-10 ENCOUNTER — Encounter: Payer: Self-pay | Admitting: Otolaryngology

## 2023-08-10 NOTE — Anesthesia Preprocedure Evaluation (Addendum)
Anesthesia Evaluation  Patient identified by MRN, date of birth, ID band Patient awake    Reviewed: Allergy & Precautions, H&P , NPO status , Patient's Chart, lab work & pertinent test results  Airway Mallampati: I  TM Distance: >3 FB Neck ROM: Full   Comment: Patient has OSA on CPAP and brought it with her today Dental no notable dental hx. (+) Upper Dentures, Lower Dentures   Pulmonary asthma , sleep apnea and Continuous Positive Airway Pressure Ventilation , pneumonia, COPD, former smoker Hx multiple pulmonary emboli 2023    Pulmonary exam normal breath sounds clear to auscultation       Cardiovascular + angina  + CAD and +CHF  negative cardio ROS Normal cardiovascular exam+ dysrhythmias  Rhythm:Regular Rate:Normal  Left PVD Jan 2023, multiple pulmonary emboli  2D echocardiogram on 10/09/2020 revealed normal left ventricular function with LVEF 50 to 55% with normal wall motion with trivial valvular insufficiencies. 72-hour Holter monitor starting on 08/28/2020 revealed predominant sinus rhythm with a mean heart rate of 85 bpm with heart rate ranging from 66-191 bpm. There were occasional premature atrial contractions and infrequent premature ventricular contractions present. There were occasional atrial runs with the longest observed episode lasting 17 beats.   The patient underwent successful watchman left atrial appendage closure device on 03/11/2021. She is transiently on Eliquis 5 mg twice daily without increased bleeding, although she does report baseline intermittent epitaxis and hematochezia due to hemorrhoids.   Cardiomyopathy, NYHA class II, chronic diastolic CHF, PFO w/secondum ASD   Neuro/Psych  Headaches  Neuromuscular disease negative neurological ROS  negative psych ROS   GI/Hepatic negative GI ROS, Neg liver ROS,GERD  ,,  Endo/Other  negative endocrine ROS    Renal/GU negative Renal ROS  negative genitourinary    Musculoskeletal negative musculoskeletal ROS (+) Arthritis ,    Abdominal   Peds negative pediatric ROS (+)  Hematology negative hematology ROS (+) Blood dyscrasia, anemia   Anesthesia Other Findings CHF (congestive heart failure)  GERD (gastroesophageal reflux disease) Vasculitis (HCC)  Sleep apnea Spinal stenosis of lumbar region Wegener's disease, pulmonary Patent foramen ovale  History of kidney stones Gastroparesis Asthma-COPD overlap syndrome Chronic maxillary sinusitis Leukocytosis  Chronic pain syndrome Obesity  Coronary artery abnormality Chronic right sacroiliac joint pain Allergic rhinitis due to pollen Dysrhythmia  Unequal leg length Rheumatoid arthritis (HCC)  Angina pectoris (HCC) Collagen vascular disease (HCC)  Atrial fibrillation (HCC) Leakage of Watchman left atrial appendage closure device  History of GI bleed Hx DVT Hx multiple pulmonary emboli    Reproductive/Obstetrics negative OB ROS                             Anesthesia Physical Anesthesia Plan  ASA: 4  Anesthesia Plan: General   Post-op Pain Management:    Induction: Intravenous  PONV Risk Score and Plan:   Airway Management Planned: Natural Airway and Nasal Cannula  Additional Equipment:   Intra-op Plan:   Post-operative Plan:   Informed Consent: I have reviewed the patients History and Physical, chart, labs and discussed the procedure including the risks, benefits and alternatives for the proposed anesthesia with the patient or authorized representative who has indicated his/her understanding and acceptance.     Dental Advisory Given  Plan Discussed with: Anesthesiologist, CRNA and Surgeon  Anesthesia Plan Comments: (Patient consented for risks of anesthesia including but not limited to:  - adverse reactions to medications - risk of airway placement if required -  damage to eyes, teeth, lips or other oral mucosa - nerve damage due to  positioning  - sore throat or hoarseness - Damage to heart, brain, nerves, lungs, other parts of body or loss of life  Patient voiced understanding.)        Anesthesia Quick Evaluation

## 2023-08-12 ENCOUNTER — Other Ambulatory Visit: Payer: Self-pay

## 2023-08-12 MED ORDER — CIPROFLOXACIN-DEXAMETHASONE 0.3-0.1 % OT SUSP
4.0000 [drp] | Freq: Two times a day (BID) | OTIC | 0 refills | Status: DC
  Filled 2023-08-12: qty 7.5, 5d supply, fill #0

## 2023-08-15 NOTE — Discharge Instructions (Signed)
[facility] DISCHARGE INSTRUCTIONS FOR MYRINGOTOMY AND TUBE INSERTION  [facility] CREIGHTON VAUGHT, M.D.   Diet:   After surgery, the patient should take only liquids and foods as tolerated.  The patient may then have a regular diet after the effects of anesthesia have worn off, usually about four to six hours after surgery.  Activities:   The patient should rest until the effects of anesthesia have worn off.  After this, there are no restrictions on the normal daily activities.  Medications:   You will be given a prescription for antibiotic drops to be used in the ears postoperatively.  It is recommended to use 4 drops 2 times a day for 4 days, then the drops should be saved for possible future use.  The tubes should not cause any discomfort to the patient, but if there is any question, Tylenol should be given according to the instructions for the age of the patient.  Other medications should be continued normally.  Precautions:   Should there be recurrent drainage after the tubes are placed, the drops should be used for approximately 3-4 days.  If it does not clear, you should call the ENT office.  Earplugs:   Earplugs are only needed for those who are going to be submerged under water.  When taking a bath or shower and using a cup or showerhead to rinse hair, it is not necessary to wear earplugs.  These come in a variety of fashions, all of which can be obtained at our office.  However, if one is not able to come by the office, then silicone plugs can be found at most pharmacies.  It is not advised to stick anything in the ear that is not approved as an earplug.  Silly putty is not to be used as an earplug.  Swimming is allowed in patients after ear tubes are inserted, however, they must wear earplugs if they are going to be submerged under water.  For those children who are going to be swimming a lot, it is recommended to use a fitted ear mold, which can be  made by our audiologist.  If discharge is noticed from the ears, this most likely represents an ear infection.  We would recommend getting your eardrops and using them as indicated above.  If it does not clear, then you should call the ENT office.  For follow up, the patient should return to the ENT office three weeks postoperatively and then every six months as required by the doctor. 

## 2023-08-17 ENCOUNTER — Encounter: Payer: Self-pay | Admitting: Otolaryngology

## 2023-08-17 ENCOUNTER — Encounter
Admission: RE | Disposition: A | Discharge status: Home / Self Care | Payer: Self-pay | Source: Home / Self Care | Attending: Otolaryngology

## 2023-08-17 ENCOUNTER — Ambulatory Visit
Admission: RE | Admit: 2023-08-17 | Discharge: 2023-08-17 | Disposition: A | Discharge status: Home / Self Care | Payer: Medicare Other | Attending: Otolaryngology | Admitting: Otolaryngology

## 2023-08-17 ENCOUNTER — Ambulatory Visit: Payer: Medicare Other | Admitting: Anesthesiology

## 2023-08-17 ENCOUNTER — Other Ambulatory Visit: Payer: Self-pay

## 2023-08-17 DIAGNOSIS — K219 Gastro-esophageal reflux disease without esophagitis: Secondary | ICD-10-CM | POA: Diagnosis not present

## 2023-08-17 DIAGNOSIS — G4733 Obstructive sleep apnea (adult) (pediatric): Secondary | ICD-10-CM | POA: Insufficient documentation

## 2023-08-17 DIAGNOSIS — G894 Chronic pain syndrome: Secondary | ICD-10-CM | POA: Insufficient documentation

## 2023-08-17 DIAGNOSIS — I5032 Chronic diastolic (congestive) heart failure: Secondary | ICD-10-CM | POA: Diagnosis not present

## 2023-08-17 DIAGNOSIS — J4489 Other specified chronic obstructive pulmonary disease: Secondary | ICD-10-CM | POA: Insufficient documentation

## 2023-08-17 DIAGNOSIS — H6693 Otitis media, unspecified, bilateral: Secondary | ICD-10-CM | POA: Diagnosis present

## 2023-08-17 DIAGNOSIS — I4891 Unspecified atrial fibrillation: Secondary | ICD-10-CM | POA: Insufficient documentation

## 2023-08-17 DIAGNOSIS — H6993 Unspecified Eustachian tube disorder, bilateral: Secondary | ICD-10-CM | POA: Diagnosis not present

## 2023-08-17 DIAGNOSIS — I2510 Atherosclerotic heart disease of native coronary artery without angina pectoris: Secondary | ICD-10-CM | POA: Diagnosis not present

## 2023-08-17 DIAGNOSIS — Z87891 Personal history of nicotine dependence: Secondary | ICD-10-CM | POA: Diagnosis not present

## 2023-08-17 DIAGNOSIS — Z86711 Personal history of pulmonary embolism: Secondary | ICD-10-CM | POA: Insufficient documentation

## 2023-08-17 HISTORY — PX: MYRINGOTOMY WITH TUBE PLACEMENT: SHX5663

## 2023-08-17 HISTORY — DX: Presence of external hearing-aid: Z97.4

## 2023-08-17 HISTORY — DX: Personal history of pulmonary embolism: Z86.711

## 2023-08-17 HISTORY — DX: Presence of other specified functional implants: Z96.89

## 2023-08-17 HISTORY — DX: Obstructive sleep apnea (adult) (pediatric): G47.33

## 2023-08-17 HISTORY — DX: Presence of dental prosthetic device (complete) (partial): Z97.2

## 2023-08-17 HISTORY — DX: Other specified chronic obstructive pulmonary disease: J44.89

## 2023-08-17 HISTORY — DX: Migraine, unspecified, not intractable, without status migrainosus: G43.909

## 2023-08-17 SURGERY — MYRINGOTOMY WITH TUBE PLACEMENT
Anesthesia: General | Site: Ear | Laterality: Bilateral

## 2023-08-17 MED ORDER — PROPOFOL 10 MG/ML IV BOLUS
INTRAVENOUS | Status: DC | PRN
Start: 2023-08-17 — End: 2023-08-17
  Administered 2023-08-17: 150 mg via INTRAVENOUS

## 2023-08-17 MED ORDER — ONDANSETRON HCL 4 MG/2ML IJ SOLN
INTRAMUSCULAR | Status: DC | PRN
  Administered 2023-08-17: 4 mg via INTRAVENOUS

## 2023-08-17 MED ORDER — FENTANYL CITRATE (PF) 100 MCG/2ML IJ SOLN
INTRAMUSCULAR | Status: DC | PRN
  Administered 2023-08-17: 100 ug via INTRAVENOUS

## 2023-08-17 MED ORDER — CIPROFLOXACIN-DEXAMETHASONE 0.3-0.1 % OT SUSP
OTIC | Status: DC | PRN
  Administered 2023-08-17 (×2): 4 [drp] via OTIC

## 2023-08-17 MED ORDER — EPHEDRINE SULFATE (PRESSORS) 50 MG/ML IJ SOLN
INTRAMUSCULAR | Status: DC | PRN
  Administered 2023-08-17 (×2): 5 mg via INTRAVENOUS

## 2023-08-17 MED ORDER — GLYCOPYRROLATE 0.2 MG/ML IJ SOLN
INTRAMUSCULAR | Status: DC | PRN
  Administered 2023-08-17: .2 mg via INTRAVENOUS

## 2023-08-17 MED ORDER — LACTATED RINGERS IV SOLN
INTRAVENOUS | Status: DC | PRN
Start: 2023-08-17 — End: 2023-08-17

## 2023-08-17 MED ORDER — LIDOCAINE HCL (CARDIAC) PF 100 MG/5ML IV SOSY
PREFILLED_SYRINGE | INTRAVENOUS | Status: DC | PRN
  Administered 2023-08-17: 100 mg via INTRATRACHEAL

## 2023-08-17 MED ORDER — MIDAZOLAM HCL 5 MG/5ML IJ SOLN
INTRAMUSCULAR | Status: DC | PRN
  Administered 2023-08-17: 2 mg via INTRAVENOUS

## 2023-08-17 MED ORDER — DEXAMETHASONE SODIUM PHOSPHATE 4 MG/ML IJ SOLN
INTRAMUSCULAR | Status: DC | PRN
  Administered 2023-08-17: 8 mg via INTRAVENOUS

## 2023-08-17 SURGICAL SUPPLY — 9 items
BALL CTTN LRG ABS STRL LF (GAUZE/BANDAGES/DRESSINGS) ×1
BLADE MYR LANCE NRW W/HDL (BLADE) IMPLANT
CANISTER SUCT 1200ML W/VALVE (MISCELLANEOUS) ×1 IMPLANT
COTTONBALL LRG STERILE PKG (GAUZE/BANDAGES/DRESSINGS) ×1 IMPLANT
GLOVE SURG GAMMEX PI TX LF 7.5 (GLOVE) ×1 IMPLANT
STRAP BODY AND KNEE 60X3 (MISCELLANEOUS) ×1 IMPLANT
TOWEL OR 17X26 4PK STRL BLUE (TOWEL DISPOSABLE) ×1 IMPLANT
TUBE EAR VENT ARMSTRNG FM 1.14 (TUBING) IMPLANT
TUBING SUCTION CONN 0.25 STRL (TUBING) ×1 IMPLANT

## 2023-08-17 NOTE — Transfer of Care (Signed)
Immediate Anesthesia Transfer of Care Note  Patient: Cassandra Brown  Procedure(s) Performed: MYRINGOTOMY WITH TUBE PLACEMENT (Bilateral: Ear)  Patient Location: PACU  Anesthesia Type: General  Level of Consciousness: awake, alert  and patient cooperative  Airway and Oxygen Therapy: Patient Spontanous Breathing and Patient connected to supplemental oxygen  Post-op Assessment: Post-op Vital signs reviewed, Patient's Cardiovascular Status Stable, Respiratory Function Stable, Patent Airway and No signs of Nausea or vomiting  Post-op Vital Signs: Reviewed and stable  Complications: No notable events documented.

## 2023-08-17 NOTE — Anesthesia Postprocedure Evaluation (Signed)
Anesthesia Post Note  Patient: Cassandra Brown  Procedure(s) Performed: MYRINGOTOMY WITH TUBE PLACEMENT (Bilateral: Ear)  Patient location during evaluation: PACU Anesthesia Type: General Level of consciousness: awake and alert Pain management: pain level controlled Vital Signs Assessment: post-procedure vital signs reviewed and stable Respiratory status: spontaneous breathing, nonlabored ventilation, respiratory function stable and patient connected to nasal cannula oxygen Cardiovascular status: stable and blood pressure returned to baseline Postop Assessment: no apparent nausea or vomiting Anesthetic complications: no   No notable events documented.   Last Vitals:  Vitals:   08/17/23 0810 08/17/23 0815  BP: 130/72 133/69  Pulse: (!) 58 (!) 55  Resp: 11 13  Temp: (!) 36.4 C   SpO2: 98% 94%    Last Pain:  Vitals:   08/17/23 0810  TempSrc:   PainSc: 0-No pain                 Keyla Milone C [place] Kath

## 2023-08-17 NOTE — Op Note (Signed)
..  08/17/2023  7:48 AM    Beckey Downing  [id number]   Pre-Op Dx:  EUSTACHIAN TUBE DYSFUNCTION  Post-op Dx: EUSTACHIAN TUBE DYSFUNCTION  Proc:Bilateral myringotomy with tubes  Surg: Randol Zumstein  Anes:  General by mask  EBL:  None  Comp:  None  Findings:  Bilateral myringosclerosis Left > Right.  Serous effusion on left.  Tubes placed anterior inferiorly.  Procedure: With the patient in a comfortable supine position, general mask anesthesia was administered.  At an appropriate level, microscope and speculum were used to examine and clean the RIGHT ear canal.  The findings were as described above.  An anterior inferior radial myringotomy incision was sharply executed.  Middle ear contents were suctioned clear with a size 5 otologic suction.  A PE tube was placed without difficulty using a Rosen pick and Facilities manager.  Ciprodex otic solution was instilled into the external canal, and insufflated into the middle ear.  A cotton ball was placed at the external meatus. Hemostasis was observed.  This side was completed.  After completing the RIGHT side, the LEFT side was done in identical fashion.    Following this  The patient was returned to anesthesia, awakened, and transferred to recovery in stable condition.  Dispo:  PACU to home  Plan: Routine drop use and water precautions.  Recheck my office three weeks.   Roney Mans Lyndzie Zentz 7:48 AM 08/17/2023

## 2023-08-17 NOTE — H&P (Signed)
..  History and Physical paper copy reviewed and updated date of procedure and will be scanned into system.  Patient seen and examined.  Proceed with regular grommet type PE tubes.

## 2023-08-18 ENCOUNTER — Encounter: Payer: Self-pay | Admitting: Otolaryngology

## 2023-11-08 ENCOUNTER — Other Ambulatory Visit: Payer: Self-pay | Admitting: Physician Assistant

## 2023-11-08 DIAGNOSIS — M5416 Radiculopathy, lumbar region: Secondary | ICD-10-CM

## 2023-11-10 ENCOUNTER — Ambulatory Visit
Admission: RE | Admit: 2023-11-10 | Discharge: 2023-11-10 | Disposition: A | Discharge status: Home / Self Care | Payer: Medicare Other | Source: Ambulatory Visit | Attending: Physician Assistant | Admitting: Physician Assistant

## 2023-11-10 DIAGNOSIS — M5416 Radiculopathy, lumbar region: Secondary | ICD-10-CM | POA: Insufficient documentation

## 2023-11-15 ENCOUNTER — Other Ambulatory Visit: Payer: Self-pay | Admitting: Pulmonary Disease

## 2023-11-15 DIAGNOSIS — R918 Other nonspecific abnormal finding of lung field: Secondary | ICD-10-CM

## 2023-11-23 ENCOUNTER — Ambulatory Visit
Admission: RE | Admit: 2023-11-23 | Discharge: 2023-11-23 | Disposition: A | Discharge status: Home / Self Care | Payer: Medicare Other | Source: Ambulatory Visit | Attending: Pulmonary Disease | Admitting: Pulmonary Disease

## 2023-11-23 DIAGNOSIS — R918 Other nonspecific abnormal finding of lung field: Secondary | ICD-10-CM | POA: Insufficient documentation

## 2024-01-31 NOTE — Procedures (Signed)
 Complete Pulmonary Function Test  Referring Physician Parris Manna, MD  Reason for PFT OSA ASTHMA SEASONAL ALLERGIC RHINITIS    SPIROMETRY: FVC was 2.32 liters, 78% of predicted FEV1 was 1.92L, 81% of predicted FEV1/FVC ratio was  104% FEF 25-75% liters per second was 98% of predicted  LUNG VOLUMES: TLC was 90% of predicted RV was 124% of predicted  DIFFUSION CAPACITY: DLCO was 77% of predicted DLCO/VA was 101% of predicted  GOOD PT. EFFORT AND UNDERSTANDING OF PROCEDURES.   *See physician progress note for interpretation  Interpreting Physician  DR. ALESKEROV

## 2024-02-08 DIAGNOSIS — J849 Interstitial pulmonary disease, unspecified: Secondary | ICD-10-CM | POA: Insufficient documentation

## 2024-04-19 DIAGNOSIS — M19022 Primary osteoarthritis, left elbow: Secondary | ICD-10-CM | POA: Insufficient documentation

## 2024-07-02 ENCOUNTER — Telehealth: Payer: Self-pay | Admitting: *Deleted

## 2024-07-02 ENCOUNTER — Telehealth: Payer: Self-pay | Admitting: Student in an Organized Health Care Education/Training Program

## 2024-07-02 NOTE — Telephone Encounter (Signed)
 Addend:  site is tender to the touch and states the battery pack is not secure and she is able to move it around.  Dr bluford placed 2 years ago, this pain has not always been a problem but has never worked as good as the trial.  On July 5th when she woke up the pain had become more severe.  She tries position changes, moving around and sometimes that helps a little.

## 2024-07-02 NOTE — Telephone Encounter (Signed)
 PT called stated that she is having pain around the area where the battery is. Well you like for me to add patient on tomorrow

## 2024-07-02 NOTE — Telephone Encounter (Signed)
 Returned patient phone call and she is having pain around the battery site of her SCS, this was put in a couple of years ago.  States that she spoke with Lindsay/[employer] and could meet her here on Wednesday to trouble shoot.  States she feels like if she could just reach in there and pull it out it would feel so much better. Reports that permanent stimulator is nothing like the trial, which was perfect.

## 2024-07-04 ENCOUNTER — Ambulatory Visit: Admitting: Student in an Organized Health Care Education/Training Program

## 2024-07-04 ENCOUNTER — Encounter: Payer: Self-pay | Admitting: Student in an Organized Health Care Education/Training Program

## 2024-07-04 ENCOUNTER — Ambulatory Visit
Admission: RE | Admit: 2024-07-04 | Discharge: 2024-07-04 | Disposition: A | Discharge status: Home / Self Care | Source: Ambulatory Visit | Attending: Student in an Organized Health Care Education/Training Program | Admitting: Student in an Organized Health Care Education/Training Program

## 2024-07-04 ENCOUNTER — Other Ambulatory Visit: Payer: Self-pay | Admitting: Student in an Organized Health Care Education/Training Program

## 2024-07-04 VITALS — BP 118/73 | HR 63 | Temp 97.4°F | Resp 16 | Ht 62.0 in | Wt 189.0 lb

## 2024-07-04 DIAGNOSIS — T85192A Other mechanical complication of implanted electronic neurostimulator (electrode) of spinal cord, initial encounter: Secondary | ICD-10-CM | POA: Insufficient documentation

## 2024-07-04 DIAGNOSIS — Z9689 Presence of other specified functional implants: Secondary | ICD-10-CM | POA: Insufficient documentation

## 2024-07-04 DIAGNOSIS — G894 Chronic pain syndrome: Secondary | ICD-10-CM | POA: Insufficient documentation

## 2024-07-04 DIAGNOSIS — T85192S Other mechanical complication of implanted electronic neurostimulator (electrode) of spinal cord, sequela: Secondary | ICD-10-CM

## 2024-07-04 NOTE — Progress Notes (Signed)
 PROVIDER NOTE: Interpretation of information contained herein should be left to medically-trained personnel. Specific patient instructions are provided elsewhere under Patient Instructions section of medical record. This document was created in part using AI and STT-dictation technology, any transcriptional errors that may result from this process are unintentional.  Patient: Cassandra Brown  Service: E/M   PCP: Rudolpho Norleen BIRCH, MD  DOB: 05-Jun-1966  DOS: 07/04/2024  Provider: Wallie Sherry, MD  MRN: [id number]  Delivery: Face-to-face  Specialty: Interventional Pain Management  Type: Established Patient  Setting: Ambulatory outpatient facility  Specialty designation: 09  Referring Prov.: Rudolpho Norleen BIRCH, MD  Location: Outpatient office facility       History of present illness (HPI) Cassandra Brown, a 58 y.o. year old female, is here today because of her Spinal cord stimulator status [Z96.89]. Cassandra Brown primary complain today is left flank pain  Pertinent problems: Cassandra Brown has Chronic pain syndrome; Chronic sacroiliac joint pain; Lumbar radiculopathy, chronic; Spinal stenosis of lumbar region without neurogenic claudication; Status post total right knee replacement; Osteoarthritis of multiple joints; Lumbar facet arthropathy; SI joint arthritis (HCC); Chronic, continuous use of opioids; Lumbosacral radiculopathy at L5 (left); Protrusion of thoracic intervertebral disc; and Spinal cord stimulator status ([employer], implant: Dr Bluford) on their pertinent problem list.  Pain Assessment: Severity of   is reported as a 9 /10. Location:    / . Onset:  . Quality:  . Timing:  . Modifying factor(s):  SABRA Vitals:  height is 5' 2 (1.575 m) and weight is 189 lb (85.7 kg). Her temporal temperature is 97.4 F (36.3 C) (abnormal). Her blood pressure is 118/73 and her pulse is 63. Her respiration is 16 and oxygen saturation is 97%.  BMI: Estimated body mass index is 34.57 kg/m as calculated from the  following:   Height as of this encounter: 5' 2 (1.575 m).   Weight as of this encounter: 189 lb (85.7 kg).  Last encounter: Visit date not found. Last procedure: Visit date not found.  Reason for encounter:   Discussed the use of AI scribe software for clinical note transcription with the patient, who gave verbal consent to proceed.  History of Present Illness   Cassandra Brown is a 58 year old female with a spinal cord stimulator who presents with severe pain and lack of relief from the device and persistent pain at her IPG site.  She has been experiencing severe pain despite having a spinal cord stimulator (SCS) implanted over two years ago with Dr. Bluford. The trial SCS provided significant relief, but the current device has not been effective since its implantation. She describes the pain as unbearable and states, 'I can't take this pain' as a pertains to her IPG site.  There is significant tenderness at the implantable pulse generator (IPG) site, which is also mobile. She describes the IPG as moving 'up and down, side to side' and applies pressure to the area for temporary relief. She has attempted to alleviate the pain by using a belt to apply pressure, which provided short-term relief but is not sustainable.  She is currently taking hydrocodone  10/325 mg every four hours and Soma  three times a day for pain management, but these medications have not provided adequate relief. She has also tried 4% lidocaine  patches, but they have not been effective.   The pain is severe enough to disrupt her sleep, waking her up in the middle of the night. She wants to have the device removed, stating, 'Right  now you could lay me down in there. And cut it and take it out.'   Morna with [employer] was here today to optimize programming and troubleshoot     ROS  Constitutional: Denies any fever or chills Gastrointestinal: No reported hemesis, hematochezia, vomiting, or acute GI  distress Musculoskeletal: IPG site pain Neurological: No reported episodes of acute onset apraxia, aphasia, dysarthria, agnosia, amnesia, paralysis, loss of coordination, or loss of consciousness  Medication Review  Centrum Performance, Dupilumab , EPINEPHrine , Galcanezumab-gnlm, HYDROcodone -acetaminophen , Rimegepant Sulfate , Tiotropium Bromide  Monohydrate, albuterol , butalbital-acetaminophen -caffeine, carisoprodol , cholecalciferol , ciprofloxacin -dexamethasone , clobetasol  cream, ferrous sulfate, folic acid , furosemide , gentamicin  ointment, methotrexate , montelukast , naloxone , pantoprazole , pentoxifylline , pimecrolimus , pregabalin , promethazine , propranolol  ER, riTUXimab, rosuvastatin , sodium chloride , sotalol , sucralfate , and triamcinolone  cream  History Review  Allergy: Cassandra Brown is allergic to cyclobenzaprine, ibuprofen, morphine, meperidine, sulfa antibiotics, and onion. Drug: Cassandra Brown  reports no history of drug use. Alcohol:  reports that she does not currently use alcohol. Tobacco:  reports that she quit smoking about 13 years ago. Her smoking use included cigarettes. She started smoking about 46 years ago. She has a 99 pack-year smoking history. She has never used smokeless tobacco. Social: Cassandra Brown  reports that she quit smoking about 13 years ago. Her smoking use included cigarettes. She started smoking about 46 years ago. She has a 99 pack-year smoking history. She has never used smokeless tobacco. She reports that she does not currently use alcohol. She reports that she does not use drugs. Medical:  has a past medical history of Allergic rhinitis due to pollen, Angina pectoris (HCC), Asthma-COPD overlap syndrome (HCC), Atrial fibrillation (HCC), CHF (congestive heart failure) (HCC), Chronic maxillary sinusitis, Chronic pain syndrome, Chronic right sacroiliac joint pain, Collagen vascular disease (HCC), Coronary artery abnormality, COVID-19 (12/2021), Dysrhythmia, Gastroparesis, GERD  (gastroesophageal reflux disease), History of DVT (deep vein thrombosis) (12/2021), History of GI bleed, History of kidney stones, History of pulmonary embolism, Leakage of Watchman left atrial appendage closure device, Leukocytosis, Migraine headache, Obesity, OSA on CPAP, Patent foramen ovale, Rheumatoid arthritis (HCC), Sleep apnea, Spinal cord stimulator status, Spinal stenosis of lumbar region, Unequal leg length, Vasculitis (HCC), Wears dentures, Wears hearing aid in both ears, and Wegener's disease, pulmonary. Surgical: Cassandra Brown  has a past surgical history that includes Joint replacement (Right); Knee surgery (Left); Shoulder surgery (Bilateral); Appendectomy; Cholecystectomy; Thoracic laminectomy for spinal cord stimulator (Bilateral, 06/23/2020); Pulse generator implant (Left, 06/23/2020); Video bronchoscopy with endobronchial ultrasound (N/A, 02/27/2021); Video bronchoscopy with endobronchial navigation (N/A, 02/27/2021); Nasal hemorrhage control (N/A, 05/13/2021); Back surgery; Colonoscopy (N/A, 04/30/2022); Esophagogastroduodenoscopy (N/A, 04/30/2022); Colonoscopy with propofol  (N/A, 01/14/2023); Esophagogastroduodenoscopy (egd) with propofol  (N/A, 01/14/2023); and Myringotomy with tube placement (Bilateral, 08/17/2023). Family: family history is not on file.  Laboratory Chemistry Profile   Renal Lab Results  Component Value Date   BUN 11 01/12/2022   CREATININE 0.65 01/12/2022   GFRAA >60 06/19/2020   GFRNONAA >60 01/12/2022    Hepatic Lab Results  Component Value Date   AST 25 01/10/2022   ALT 25 01/10/2022   ALBUMIN 2.3 (L) 01/10/2022   ALKPHOS 47 01/10/2022   LIPASE 58 (H) 01/04/2022    Electrolytes Lab Results  Component Value Date   NA 144 01/12/2022   K 4.5 01/12/2022   CL 103 01/12/2022   CALCIUM  8.9 01/12/2022   MG 2.0 01/10/2022   PHOS 3.3 01/11/2022    Bone No results found for: VD25OH, VD125OH2TOT, CI6874NY7, CI7874NY7, 25OHVITD1, 25OHVITD2,  25OHVITD3, TESTOFREE, TESTOSTERONE  Inflammation (CRP: Acute Phase) (ESR: Chronic Phase) Lab Results  Component Value Date   CRP 6.2 (H) 01/12/2022   LATICACIDVEN 1.3 01/05/2022         Note: Above Lab results reviewed.  Recent Imaging Review  DG PAIN CLINIC C-ARM 1-60 MIN NO REPORT Fluoro was used, but no Radiologist interpretation will be provided.  Please refer to NOTES tab for provider progress note. Note: Reviewed        Physical Exam  Vitals: BP 118/73 (BP Location: Right Arm, Patient Position: Sitting, Cuff Size: Normal)   Pulse 63   Temp (!) 97.4 F (36.3 C) (Temporal)   Resp 16   Ht 5' 2 (1.575 m)   Wt 189 lb (85.7 kg)   SpO2 97%   BMI 34.57 kg/m  BMI: Estimated body mass index is 34.57 kg/m as calculated from the following:   Height as of this encounter: 5' 2 (1.575 m).   Weight as of this encounter: 189 lb (85.7 kg). Ideal: Ideal body weight: 50.1 kg (110 lb 7.2 oz) Adjusted ideal body weight: 64.4 kg (141 lb 13.9 oz) General appearance: Well nourished, well developed, and well hydrated. In no apparent acute distress Mental status: Alert, oriented x 3 (person, place, & time)       Respiratory: No evidence of acute respiratory distress Eyes: PERLA  IPG site pain  Assessment   Diagnosis  1. Spinal cord stimulator status ([employer], implant: Dr Bluford)   2. Spinal cord stimulator dysfunction, sequela   3. Chronic pain syndrome      Updated Problems: Problem  Spinal Cord Stimulator Dysfunction, Sequela    Plan of Care  Assessment and Plan    Chronic pain at IPG site status post spinal cord stimulator   Chronic pain has persisted for over two years following [employer] spinal cord stimulator placement by Dr. Bluford. The trial stimulator provided significant relief, but the current device is not as effective. There is no evidence of lead fracture or migration, and leads are correctly positioned. Significant tenderness is present at the IPG site,  which is notably mobile. Current pain management with hydrocodone  and Soma  is inadequate, and lidocaine  patches are ineffective. Severe discomfort persists, and explantation or revision of the stimulator is under consideration. Discussed the possibility of a new battery with better programming, such as the Inceptive.  We also discussed SCS explant.  Refer to Dr. Clois or Dr. Claudene for evaluation and consideration of explantation or revision IPG site battery/replacement with Inceptiv.  I will refer the patient to Dr. Claudene to consider battery revision/replacement versus explant      Orders:  Orders Placed This Encounter  Procedures   Ambulatory referral to Neurosurgery    Referral Priority:   Routine    Referral Type:   Surgical    Referral Reason:   Specialty Services Required    Referred to Provider:   Claudene Penne ORN, MD    Requested Specialty:   Neurosurgery    Number of Visits Requested:   1     Left L4/5 ESI #1 07/13/21, left piriformis, left sacroiliac joint injection 09/09/2021, 10/21/21, 11/25/21. B/L L3,4,5 MBNB #1 09/13/22    Return for patient will call to schedule F2F appt prn.    Recent Visits No visits were found meeting these conditions. Showing recent visits within past 90 days and meeting all other requirements Today's Visits Date Type Provider Dept  07/04/24 Office Visit Marcelino Nurse, MD [facility]  Showing today's visits and meeting all other requirements Future Appointments No visits were found meeting  these conditions. Showing future appointments within next 90 days and meeting all other requirements  I discussed the assessment and treatment plan with the patient. The patient was provided an opportunity to ask questions and all were answered. The patient agreed with the plan and demonstrated an understanding of the instructions.  Patient advised to call back or seek an in-person evaluation if the symptoms or condition worsens.  Duration of  encounter: .  Total time on encounter, as per AMA guidelines included both the face-to-face and non-face-to-face time personally spent by the physician and/or other qualified health care professional(s) on the day of the encounter (includes time in activities that require the physician or other qualified health care professional and does not include time in activities normally performed by clinical staff). Physician's time may include the following activities when performed: Preparing to see the patient (e.g., pre-charting review of records, searching for previously ordered imaging, lab work, and nerve conduction tests) Review of prior analgesic pharmacotherapies. Reviewing PMP Interpreting ordered tests (e.g., lab work, imaging, nerve conduction tests) Performing post-procedure evaluations, including interpretation of diagnostic procedures Obtaining and/or reviewing separately obtained history Performing a medically appropriate examination and/or evaluation Counseling and educating the patient/family/caregiver Ordering medications, tests, or procedures Referring and communicating with other health care professionals (when not separately reported) Documenting clinical information in the electronic or other health record Independently interpreting results (not separately reported) and communicating results to the patient/ family/caregiver Care coordination (not separately reported)  Note by: Wallie Sherry, MD (TTS and AI technology used. I apologize for any typographical errors that were not detected and corrected.) Date: 07/04/2024; Time: 4:14 PM

## 2024-07-06 ENCOUNTER — Encounter: Payer: Self-pay | Admitting: Neurosurgery

## 2024-07-06 DIAGNOSIS — I10 Essential (primary) hypertension: Secondary | ICD-10-CM | POA: Insufficient documentation

## 2024-07-06 NOTE — H&P (View-Only) (Signed)
 Referring Physician:  Marcelino Nurse, MD [address] [place],  [place] [address]  Primary Physician:  Rudolpho Norleen BIRCH, MD  History of Present Illness: 07/11/2024 [name] is here today with a chief complaint of severe pain and mobility of her battery at the insertion site.  She has been having difficulty with some hypermobility and discomfort with her battery site for over a year, states that this has become incredibly severe recently to the point of being intolerable.  She has noticed that the battery has moved significantly and is causing severe pain.  She has not noticed any fluid collection or any signs of infection however the movement in her implant location are causing her a significant amount of physical distress.  Past Surgery:  06/23/2020-THORACIC LAMINECTOMY FOR SPINAL CORD STIMULATOR   The symptoms are causing a significant impact on the patient's life.   I have utilized the care everywhere function in epic to review the outside records available from external health systems.  Review of Systems:  A 10 point review of systems is negative, except for the pertinent positives and negatives detailed in the HPI.  Past Medical History: Past Medical History:  Diagnosis Date   Allergic rhinitis due to pollen    Angina pectoris (HCC)    Asthma-COPD overlap syndrome (HCC)    Atrial fibrillation (HCC)    CHF (congestive heart failure) (HCC)    Chronic maxillary sinusitis    Chronic pain syndrome    BACK   Chronic right sacroiliac joint pain    Collagen vascular disease (HCC)    Coronary artery abnormality    COVID-19 12/2021   Dysrhythmia    AFIB,  FREQ PVC   Gastroparesis    GERD (gastroesophageal reflux disease)    History of DVT (deep vein thrombosis) 12/2021   LLE   History of GI bleed    History of kidney stones    History of pulmonary embolism    Leakage of Watchman left atrial appendage closure device    Leukocytosis    Migraine headache     Obesity    OSA on CPAP    Patent foramen ovale    Rheumatoid arthritis (HCC)    OSTEOARTHRITIS   Sleep apnea    Spinal cord stimulator status    Spinal stenosis of lumbar region    Unequal leg length    Vasculitis (HCC)    Wears dentures    full upper and lower   Wears hearing aid in both ears    Wegener's disease, pulmonary     Past Surgical History: Past Surgical History:  Procedure Laterality Date   APPENDECTOMY     BACK SURGERY     Spinal cord stimulator   CHOLECYSTECTOMY     COLONOSCOPY N/A 04/30/2022   Procedure: COLONOSCOPY;  Surgeon: Onita Elspeth Sharper, DO;  Location: [facility] ENDOSCOPY;  Service: Gastroenterology;  Laterality: N/A;   COLONOSCOPY WITH PROPOFOL  N/A 01/14/2023   Procedure: COLONOSCOPY WITH PROPOFOL ;  Surgeon: Onita Elspeth Sharper, DO;  Location: [facility] ENDOSCOPY;  Service: Gastroenterology;  Laterality: N/A;   ESOPHAGOGASTRODUODENOSCOPY N/A 04/30/2022   Procedure: ESOPHAGOGASTRODUODENOSCOPY (EGD);  Surgeon: Onita Elspeth Sharper, DO;  Location: [facility] ENDOSCOPY;  Service: Gastroenterology;  Laterality: N/A;   ESOPHAGOGASTRODUODENOSCOPY (EGD) WITH PROPOFOL  N/A 01/14/2023   Procedure: ESOPHAGOGASTRODUODENOSCOPY (EGD) WITH PROPOFOL ;  Surgeon: Onita Elspeth Sharper, DO;  Location: [facility] ENDOSCOPY;  Service: Gastroenterology;  Laterality: N/A;   JOINT REPLACEMENT Right    knee   KNEE SURGERY Left  MYRINGOTOMY WITH TUBE PLACEMENT Bilateral 08/17/2023   Procedure: MYRINGOTOMY WITH TUBE PLACEMENT;  Surgeon: Milissa Hamming, MD;  Location: [facility];  Service: ENT;  Laterality: Bilateral;   NASAL HEMORRHAGE CONTROL N/A 05/13/2021   Procedure: EPISTAXIS CONTROL;  Surgeon: Milissa Hamming, MD;  Location: [facility] ORS;  Service: ENT;  Laterality: N/A;   PULSE GENERATOR IMPLANT Left 06/23/2020   Procedure: LEFT FLANK PULSE GENERATOR IMPLANT;  Surgeon: Bluford Standing, MD;  Location: [facility] ORS;  Service: Neurosurgery;  Laterality: Left;   SHOULDER SURGERY Bilateral     THORACIC LAMINECTOMY FOR SPINAL CORD STIMULATOR Bilateral 06/23/2020   Procedure: THORACIC SPINAL CORD STIMULATOR;  Surgeon: Bluford Standing, MD;  Location: [facility] ORS;  Service: Neurosurgery;  Laterality: Bilateral;   VIDEO BRONCHOSCOPY WITH ENDOBRONCHIAL NAVIGATION N/A 02/27/2021   Procedure: VIDEO BRONCHOSCOPY WITH ENDOBRONCHIAL NAVIGATION;  Surgeon: Parris Manna, MD;  Location: [facility] ORS;  Service: Thoracic;  Laterality: N/A;   VIDEO BRONCHOSCOPY WITH ENDOBRONCHIAL ULTRASOUND N/A 02/27/2021   Procedure: VIDEO BRONCHOSCOPY WITH ENDOBRONCHIAL ULTRASOUND;  Surgeon: Parris Manna, MD;  Location: [facility] ORS;  Service: Thoracic;  Laterality: N/A;    Allergies: Allergies as of 07/11/2024 - Review Complete 07/11/2024  Allergen Reaction Noted   Cyclobenzaprine Hives 12/17/2010   Ibuprofen Hives 11/27/2009   Mepolizumab Shortness Of Breath 07/11/2024   Morphine Itching and Swelling 12/17/2010   Meperidine Itching and Swelling 12/29/2011   Sulfa antibiotics Swelling 01/28/2020   Onion Itching 01/01/2019    Medications:  Current Outpatient Medications:    albuterol  (VENTOLIN  HFA) 108 (90 Base) MCG/ACT inhaler, Inhale 1-2 puffs into the lungs every 4 (four) hours as needed for wheezing or shortness of breath., Disp: 8 g, Rfl: 0   BAC 50-325-40 MG tablet, Take 1 tablet by mouth every 4 (four) hours as needed for headache., Disp: , Rfl:    carisoprodol  (SOMA ) 350 MG tablet, Take 350 mg by mouth 3 (three) times daily., Disp: , Rfl:    cholecalciferol  (VITAMIN D3) 25 MCG (1000 UNIT) tablet, Take 1,000 Units by mouth daily., Disp: , Rfl:    clobetasol  cream (TEMOVATE ) 0.05 %, Apply 1 application topically 2 (two) times daily as needed (rash)., Disp: , Rfl:    dexamethasone  (DECADRON ) 1 MG tablet, Take 1 mg by mouth once., Disp: , Rfl:    EMGALITY 120 MG/ML SOAJ, Inject 120 mg into the skin every 28 (twenty-eight) days., Disp: , Rfl:    EPINEPHrine  0.3 mg/0.3 mL IJ SOAJ injection, Inject 0.3 mg into  the muscle as needed., Disp: , Rfl:    ferrous sulfate 325 (65 FE) MG tablet, Take 325 mg by mouth daily with breakfast., Disp: , Rfl:    folic acid  (FOLVITE ) 1 MG tablet, Take 1 mg by mouth daily., Disp: , Rfl:    furosemide  (LASIX ) 20 MG tablet, Take 40 mg by mouth daily., Disp: , Rfl:    gentamicin  ointment (GARAMYCIN ) 0.1 %, Apply 1 application topically daily as needed (rash)., Disp: , Rfl:    HYDROcodone -acetaminophen  (NORCO) 10-325 MG tablet, Take 1 tablet by mouth every 4 (four) hours., Disp: , Rfl:    methotrexate  2.5 MG tablet, Take 25 mg by mouth once a week. Caution:Chemotherapy. Protect from light. 10 tablets once per week, Disp: , Rfl:    montelukast  (SINGULAIR ) 10 MG tablet, Take 10 mg by mouth at bedtime., Disp: , Rfl:    naloxone  (NARCAN ) nasal spray 4 mg/0.1 mL, For opioid overdose or excessive sleepiness after taking narcotics, Disp: 1 each, Rfl: 0   NURTEC 75  MG TBDP, Take 75 mg by mouth daily as needed for migraine., Disp: , Rfl:    pantoprazole  (PROTONIX ) 40 MG tablet, Take 40 mg by mouth 2 (two) times daily., Disp: , Rfl:    pentoxifylline  (TRENTAL ) 400 MG CR tablet, Take 400 mg by mouth in the morning and at bedtime., Disp: , Rfl:    pimecrolimus  (ELIDEL ) 1 % cream, Apply 1 application topically 2 (two) times daily as needed (rash)., Disp: , Rfl:    predniSONE  (DELTASONE ) 5 MG tablet, Take 5 mg by mouth daily., Disp: , Rfl:    pregabalin  (LYRICA ) 75 MG capsule, Take 75 mg by mouth 3 (three) times daily., Disp: , Rfl:    promethazine  (PHENERGAN ) 25 MG tablet, Take 25 mg by mouth every 6 (six) hours as needed for nausea or vomiting. , Disp: , Rfl:    propranolol  ER (INDERAL  LA) 80 MG 24 hr capsule, Take 80 mg by mouth at bedtime., Disp: , Rfl:    riTUXimab (RITUXAN IV), Inject into the vein every 4 (four) months., Disp: , Rfl:    rosuvastatin  (CRESTOR ) 10 MG tablet, Take 10 mg by mouth at bedtime., Disp: , Rfl:    sodium chloride  (OCEAN) 0.65 % SOLN nasal spray, Place 1 spray  into both nostrils as needed for congestion., Disp: , Rfl:    sotalol  (BETAPACE ) 80 MG tablet, Take 40 mg by mouth 2 (two) times daily., Disp: , Rfl:    Specialty Vitamins Products (CENTRUM PERFORMANCE) TABS, Take 1 tablet by mouth daily., Disp: , Rfl:    SPIRIVA  RESPIMAT 1.25 MCG/ACT AERS, Inhale 2 puffs into the lungs daily., Disp: , Rfl:    sucralfate  (CARAFATE ) 1 g tablet, Take 1 g by mouth 2 (two) times daily before a meal., Disp: , Rfl:    triamcinolone  cream (KENALOG ) 0.1 %, Apply 1 application topically daily as needed (itching). , Disp: , Rfl:    TUBERCULIN SYR 1CC/25GX5/8 25G X 5/8 1 ML MISC, See admin instructions., Disp: , Rfl:   Social History: Social History   Tobacco Use   Smoking status: Former    Current packs/day: 0.00    Average packs/day: 3.0 packs/day for 33.0 years (99.0 ttl pk-yrs)    Types: Cigarettes    Start date: 02/06/1978    Quit date: 02/06/2011    Years since quitting: 13.4   Smokeless tobacco: Never  Vaping Use   Vaping status: Never Used  Substance Use Topics   Alcohol use: Not Currently   Drug use: Never    Family Medical History: History reviewed. No pertinent family history.  Physical Examination: Vitals:   07/11/24 1315  BP: 126/88    General: Patient is in no apparent distress. Attention to examination is appropriate.  Neck:   Supple.  Full range of motion.  Respiratory: Patient is breathing without any difficulty.   NEUROLOGICAL:     Awake, alert, oriented to person, place, and time.  Speech is clear and fluent.   Strength:  Side Iliopsoas Quads Hamstring PF DF EHL  R 5 5 5 5 5 5   L 5 5 5 5 5 5    Battery appears to be hypermobile with severe/significant amount of motion, when this gets to the end of its location it causes severe pain.  No evidence of fluid collection, no evidence of wound breakdown.  Imaging: Pending  Medical Decision Making/Assessment and Plan: Ms. Campau is a pleasant 58 y.o. female with history of a  thoracic spinal cord stimulator placement.  She has  always had chronic issues with the battery placement site, however this exacerbation has become incredibly severe and is now debilitating.  She noticed that she was at her baseline with just mild pain in her generator site until she had a significant change where the battery became even more mobile and is now moving freely and causing her severe pain in many positions.  Given the severity of her pain, we will plan on removing her system, we will plan to swab the site to make sure there is no evidence of any infection given the acute worsening.  Our suspicion is low however we would like to be conservative and replacing her therapy with a newer generator.  A newer generator would be required given her intermittent breakthrough hyper stimulations causing her significant discomfort and leading her to not use her stimulator as much.  Once we have results back from cultures we will either remove the entire system or we will translate the system to the contralateral side to give the new generator a chance to have more solid fixation within the insertion site.  We discussed risk and benefits of surgery including but not limited to bleeding infection need for removal, hardware damage, damage to the leads.  Need for further surgery.  Need for further removal.  Thank you for involving me in the care of this patient.    Penne MICAEL Sharps MD/MSCR Neurosurgery

## 2024-07-06 NOTE — Progress Notes (Unsigned)
 Referring Physician:  Marcelino Nurse, MD [address] [place],  [place] [address]  Primary Physician:  Rudolpho Norleen BIRCH, MD  History of Present Illness: 07/11/2024 [name] is here today with a chief complaint of severe pain and mobility of her battery at the insertion site.  She has been having difficulty with some hypermobility and discomfort with her battery site for over a year, states that this has become incredibly severe recently to the point of being intolerable.  She has noticed that the battery has moved significantly and is causing severe pain.  She has not noticed any fluid collection or any signs of infection however the movement in her implant location are causing her a significant amount of physical distress.  Past Surgery:  06/23/2020-THORACIC LAMINECTOMY FOR SPINAL CORD STIMULATOR   The symptoms are causing a significant impact on the patient's life.   I have utilized the care everywhere function in epic to review the outside records available from external health systems.  Review of Systems:  A 10 point review of systems is negative, except for the pertinent positives and negatives detailed in the HPI.  Past Medical History: Past Medical History:  Diagnosis Date   Allergic rhinitis due to pollen    Angina pectoris (HCC)    Asthma-COPD overlap syndrome (HCC)    Atrial fibrillation (HCC)    CHF (congestive heart failure) (HCC)    Chronic maxillary sinusitis    Chronic pain syndrome    BACK   Chronic right sacroiliac joint pain    Collagen vascular disease (HCC)    Coronary artery abnormality    COVID-19 12/2021   Dysrhythmia    AFIB,  FREQ PVC   Gastroparesis    GERD (gastroesophageal reflux disease)    History of DVT (deep vein thrombosis) 12/2021   LLE   History of GI bleed    History of kidney stones    History of pulmonary embolism    Leakage of Watchman left atrial appendage closure device    Leukocytosis    Migraine headache     Obesity    OSA on CPAP    Patent foramen ovale    Rheumatoid arthritis (HCC)    OSTEOARTHRITIS   Sleep apnea    Spinal cord stimulator status    Spinal stenosis of lumbar region    Unequal leg length    Vasculitis (HCC)    Wears dentures    full upper and lower   Wears hearing aid in both ears    Wegener's disease, pulmonary     Past Surgical History: Past Surgical History:  Procedure Laterality Date   APPENDECTOMY     BACK SURGERY     Spinal cord stimulator   CHOLECYSTECTOMY     COLONOSCOPY N/A 04/30/2022   Procedure: COLONOSCOPY;  Surgeon: Onita Elspeth Sharper, DO;  Location: [facility] ENDOSCOPY;  Service: Gastroenterology;  Laterality: N/A;   COLONOSCOPY WITH PROPOFOL  N/A 01/14/2023   Procedure: COLONOSCOPY WITH PROPOFOL ;  Surgeon: Onita Elspeth Sharper, DO;  Location: [facility] ENDOSCOPY;  Service: Gastroenterology;  Laterality: N/A;   ESOPHAGOGASTRODUODENOSCOPY N/A 04/30/2022   Procedure: ESOPHAGOGASTRODUODENOSCOPY (EGD);  Surgeon: Onita Elspeth Sharper, DO;  Location: [facility] ENDOSCOPY;  Service: Gastroenterology;  Laterality: N/A;   ESOPHAGOGASTRODUODENOSCOPY (EGD) WITH PROPOFOL  N/A 01/14/2023   Procedure: ESOPHAGOGASTRODUODENOSCOPY (EGD) WITH PROPOFOL ;  Surgeon: Onita Elspeth Sharper, DO;  Location: [facility] ENDOSCOPY;  Service: Gastroenterology;  Laterality: N/A;   JOINT REPLACEMENT Right    knee   KNEE SURGERY Left  MYRINGOTOMY WITH TUBE PLACEMENT Bilateral 08/17/2023   Procedure: MYRINGOTOMY WITH TUBE PLACEMENT;  Surgeon: Milissa Hamming, MD;  Location: [facility];  Service: ENT;  Laterality: Bilateral;   NASAL HEMORRHAGE CONTROL N/A 05/13/2021   Procedure: EPISTAXIS CONTROL;  Surgeon: Milissa Hamming, MD;  Location: [facility] ORS;  Service: ENT;  Laterality: N/A;   PULSE GENERATOR IMPLANT Left 06/23/2020   Procedure: LEFT FLANK PULSE GENERATOR IMPLANT;  Surgeon: Bluford Standing, MD;  Location: [facility] ORS;  Service: Neurosurgery;  Laterality: Left;   SHOULDER SURGERY Bilateral     THORACIC LAMINECTOMY FOR SPINAL CORD STIMULATOR Bilateral 06/23/2020   Procedure: THORACIC SPINAL CORD STIMULATOR;  Surgeon: Bluford Standing, MD;  Location: [facility] ORS;  Service: Neurosurgery;  Laterality: Bilateral;   VIDEO BRONCHOSCOPY WITH ENDOBRONCHIAL NAVIGATION N/A 02/27/2021   Procedure: VIDEO BRONCHOSCOPY WITH ENDOBRONCHIAL NAVIGATION;  Surgeon: Parris Manna, MD;  Location: [facility] ORS;  Service: Thoracic;  Laterality: N/A;   VIDEO BRONCHOSCOPY WITH ENDOBRONCHIAL ULTRASOUND N/A 02/27/2021   Procedure: VIDEO BRONCHOSCOPY WITH ENDOBRONCHIAL ULTRASOUND;  Surgeon: Parris Manna, MD;  Location: [facility] ORS;  Service: Thoracic;  Laterality: N/A;    Allergies: Allergies as of 07/11/2024 - Review Complete 07/11/2024  Allergen Reaction Noted   Cyclobenzaprine Hives 12/17/2010   Ibuprofen Hives 11/27/2009   Mepolizumab Shortness Of Breath 07/11/2024   Morphine Itching and Swelling 12/17/2010   Meperidine Itching and Swelling 12/29/2011   Sulfa antibiotics Swelling 01/28/2020   Onion Itching 01/01/2019    Medications:  Current Outpatient Medications:    albuterol  (VENTOLIN  HFA) 108 (90 Base) MCG/ACT inhaler, Inhale 1-2 puffs into the lungs every 4 (four) hours as needed for wheezing or shortness of breath., Disp: 8 g, Rfl: 0   BAC 50-325-40 MG tablet, Take 1 tablet by mouth every 4 (four) hours as needed for headache., Disp: , Rfl:    carisoprodol  (SOMA ) 350 MG tablet, Take 350 mg by mouth 3 (three) times daily., Disp: , Rfl:    cholecalciferol  (VITAMIN D3) 25 MCG (1000 UNIT) tablet, Take 1,000 Units by mouth daily., Disp: , Rfl:    clobetasol  cream (TEMOVATE ) 0.05 %, Apply 1 application topically 2 (two) times daily as needed (rash)., Disp: , Rfl:    dexamethasone  (DECADRON ) 1 MG tablet, Take 1 mg by mouth once., Disp: , Rfl:    EMGALITY 120 MG/ML SOAJ, Inject 120 mg into the skin every 28 (twenty-eight) days., Disp: , Rfl:    EPINEPHrine  0.3 mg/0.3 mL IJ SOAJ injection, Inject 0.3 mg into  the muscle as needed., Disp: , Rfl:    ferrous sulfate 325 (65 FE) MG tablet, Take 325 mg by mouth daily with breakfast., Disp: , Rfl:    folic acid  (FOLVITE ) 1 MG tablet, Take 1 mg by mouth daily., Disp: , Rfl:    furosemide  (LASIX ) 20 MG tablet, Take 40 mg by mouth daily., Disp: , Rfl:    gentamicin  ointment (GARAMYCIN ) 0.1 %, Apply 1 application topically daily as needed (rash)., Disp: , Rfl:    HYDROcodone -acetaminophen  (NORCO) 10-325 MG tablet, Take 1 tablet by mouth every 4 (four) hours., Disp: , Rfl:    methotrexate  2.5 MG tablet, Take 25 mg by mouth once a week. Caution:Chemotherapy. Protect from light. 10 tablets once per week, Disp: , Rfl:    montelukast  (SINGULAIR ) 10 MG tablet, Take 10 mg by mouth at bedtime., Disp: , Rfl:    naloxone  (NARCAN ) nasal spray 4 mg/0.1 mL, For opioid overdose or excessive sleepiness after taking narcotics, Disp: 1 each, Rfl: 0   NURTEC 75  MG TBDP, Take 75 mg by mouth daily as needed for migraine., Disp: , Rfl:    pantoprazole  (PROTONIX ) 40 MG tablet, Take 40 mg by mouth 2 (two) times daily., Disp: , Rfl:    pentoxifylline  (TRENTAL ) 400 MG CR tablet, Take 400 mg by mouth in the morning and at bedtime., Disp: , Rfl:    pimecrolimus  (ELIDEL ) 1 % cream, Apply 1 application topically 2 (two) times daily as needed (rash)., Disp: , Rfl:    predniSONE  (DELTASONE ) 5 MG tablet, Take 5 mg by mouth daily., Disp: , Rfl:    pregabalin  (LYRICA ) 75 MG capsule, Take 75 mg by mouth 3 (three) times daily., Disp: , Rfl:    promethazine  (PHENERGAN ) 25 MG tablet, Take 25 mg by mouth every 6 (six) hours as needed for nausea or vomiting. , Disp: , Rfl:    propranolol  ER (INDERAL  LA) 80 MG 24 hr capsule, Take 80 mg by mouth at bedtime., Disp: , Rfl:    riTUXimab (RITUXAN IV), Inject into the vein every 4 (four) months., Disp: , Rfl:    rosuvastatin  (CRESTOR ) 10 MG tablet, Take 10 mg by mouth at bedtime., Disp: , Rfl:    sodium chloride  (OCEAN) 0.65 % SOLN nasal spray, Place 1 spray  into both nostrils as needed for congestion., Disp: , Rfl:    sotalol  (BETAPACE ) 80 MG tablet, Take 40 mg by mouth 2 (two) times daily., Disp: , Rfl:    Specialty Vitamins Products (CENTRUM PERFORMANCE) TABS, Take 1 tablet by mouth daily., Disp: , Rfl:    SPIRIVA  RESPIMAT 1.25 MCG/ACT AERS, Inhale 2 puffs into the lungs daily., Disp: , Rfl:    sucralfate  (CARAFATE ) 1 g tablet, Take 1 g by mouth 2 (two) times daily before a meal., Disp: , Rfl:    triamcinolone  cream (KENALOG ) 0.1 %, Apply 1 application topically daily as needed (itching). , Disp: , Rfl:    TUBERCULIN SYR 1CC/25GX5/8 25G X 5/8 1 ML MISC, See admin instructions., Disp: , Rfl:   Social History: Social History   Tobacco Use   Smoking status: Former    Current packs/day: 0.00    Average packs/day: 3.0 packs/day for 33.0 years (99.0 ttl pk-yrs)    Types: Cigarettes    Start date: 02/06/1978    Quit date: 02/06/2011    Years since quitting: 13.4   Smokeless tobacco: Never  Vaping Use   Vaping status: Never Used  Substance Use Topics   Alcohol use: Not Currently   Drug use: Never    Family Medical History: History reviewed. No pertinent family history.  Physical Examination: Vitals:   07/11/24 1315  BP: 126/88    General: Patient is in no apparent distress. Attention to examination is appropriate.  Neck:   Supple.  Full range of motion.  Respiratory: Patient is breathing without any difficulty.   NEUROLOGICAL:     Awake, alert, oriented to person, place, and time.  Speech is clear and fluent.   Strength:  Side Iliopsoas Quads Hamstring PF DF EHL  R 5 5 5 5 5 5   L 5 5 5 5 5 5    Battery appears to be hypermobile with severe/significant amount of motion, when this gets to the end of its location it causes severe pain.  No evidence of fluid collection, no evidence of wound breakdown.  Imaging: Pending  Medical Decision Making/Assessment and Plan: Ms. Campau is a pleasant 58 y.o. female with history of a  thoracic spinal cord stimulator placement.  She has  always had chronic issues with the battery placement site, however this exacerbation has become incredibly severe and is now debilitating.  She noticed that she was at her baseline with just mild pain in her generator site until she had a significant change where the battery became even more mobile and is now moving freely and causing her severe pain in many positions.  Given the severity of her pain, we will plan on removing her system, we will plan to swab the site to make sure there is no evidence of any infection given the acute worsening.  Our suspicion is low however we would like to be conservative and replacing her therapy with a newer generator.  A newer generator would be required given her intermittent breakthrough hyper stimulations causing her significant discomfort and leading her to not use her stimulator as much.  Once we have results back from cultures we will either remove the entire system or we will translate the system to the contralateral side to give the new generator a chance to have more solid fixation within the insertion site.  We discussed risk and benefits of surgery including but not limited to bleeding infection need for removal, hardware damage, damage to the leads.  Need for further surgery.  Need for further removal.  Thank you for involving me in the care of this patient.    Penne MICAEL Sharps MD/MSCR Neurosurgery

## 2024-07-11 ENCOUNTER — Ambulatory Visit: Admitting: Neurosurgery

## 2024-07-11 ENCOUNTER — Other Ambulatory Visit: Payer: Self-pay

## 2024-07-11 ENCOUNTER — Ambulatory Visit
Admission: RE | Admit: 2024-07-11 | Discharge: 2024-07-11 | Disposition: A | Discharge status: Home / Self Care | Source: Ambulatory Visit | Attending: Neurosurgery | Admitting: Neurosurgery

## 2024-07-11 ENCOUNTER — Encounter: Payer: Self-pay | Admitting: Neurosurgery

## 2024-07-11 ENCOUNTER — Ambulatory Visit
Admission: RE | Admit: 2024-07-11 | Discharge: 2024-07-11 | Disposition: A | Discharge status: Home / Self Care | Attending: Neurosurgery | Admitting: Neurosurgery

## 2024-07-11 VITALS — BP 126/88 | Ht 62.0 in | Wt 191.0 lb

## 2024-07-11 DIAGNOSIS — T85840A Pain due to nervous system prosthetic devices, implants and grafts, initial encounter: Secondary | ICD-10-CM | POA: Diagnosis not present

## 2024-07-11 DIAGNOSIS — T85122A Displacement of implanted electronic neurostimulator (electrode) of spinal cord, initial encounter: Secondary | ICD-10-CM | POA: Diagnosis present

## 2024-07-11 DIAGNOSIS — Z01818 Encounter for other preprocedural examination: Secondary | ICD-10-CM

## 2024-07-11 DIAGNOSIS — T85192A Other mechanical complication of implanted electronic neurostimulator (electrode) of spinal cord, initial encounter: Secondary | ICD-10-CM | POA: Diagnosis present

## 2024-07-11 DIAGNOSIS — T85123A Displacement of implanted electronic neurostimulator, generator, initial encounter: Secondary | ICD-10-CM | POA: Diagnosis not present

## 2024-07-11 NOTE — Progress Notes (Signed)
 07/11/24: Confirmed with Dr Penne Sharps OK to order cefazolin  + vancomycin  despite plan for intraoperative culture.

## 2024-07-11 NOTE — Patient Instructions (Signed)
 Please see below for information in regards to your upcoming surgery:   Planned surgery: left removal of internal pulse generator ([employer])   Surgery date: 07/19/24 at [facility] (Medical Mall: [address], [place], [place] [address]) - you will find out your arrival time the business day before your surgery.   Pre-op appointment at [facility] Pre-admit Testing: you will receive a call with a date/time for this appointment. If you are scheduled for an in person appointment, Pre-admit Testing is located on the first floor of the [facility] building, [address], [address]. During this appointment, they will advise you which medications you can take the morning of surgery, and which medications you will need to hold for surgery. Labs (such as blood work, EKG) may be done at your pre-op appointment. You are not required to fast for these labs. Should you need to change your pre-op appointment, please call Pre-admit testing at [phone].      Surgical clearance: we will send a clearance form to Therisa Pierre, PA-C. They may wish to see you in their office prior to signing the clearance form. If so, they may call you to schedule an appointment.    How to contact us :  If you have any questions/concerns before or after surgery, you can reach us  at [phone], or you can send a mychart message. We can be reached by phone or mychart 8am-4pm, Monday-Friday.  *Please note: Calls after 4pm are forwarded to a third party answering service. Mychart messages are not routinely monitored during evenings, weekends, and holidays. Please call our office to contact the answering service for urgent concerns during non-business hours.    If you have FMLA/disability paperwork, please drop it off or fax it to [phone]   Appointments/FMLA & disability paperwork: [employer] Registered Nurse/Surgery scheduler: Marzella Miracle, RN Certified Medical Assistants:  Don, CMA, Elenor, CMA, & Damien, CMA Physician Assistants: Lyle Decamp, PA-C, Edsel Goods, PA-C & Glade Boys, PA-C Surgeons: Penne Sharps, MD & Reeves Daisy, MD   [facility] PREADMIT TESTING VISIT and SURGERY INFORMATION SHEET   Now that surgery has been scheduled you can anticipate several phone calls from [facility] services. A pharmacy technician will call you to verify your current list of medications taken at home.               The Pre-Service Center will call to verify your insurance information and to give you billing estimates and information.             The Preadmit Testing Office will be calling to schedule a visit to obtain information for the anesthesia team and provide instructions on preparation for surgery.  What can you expect for the Preadmit Testing Visit: Appointments may be scheduled in-person or by telephone.  If a telephone visit is scheduled, you may be asked to come into the office to have lab tests or other studies performed.   This visit will not be completed any greater than 14 days prior to your surgery.  If your surgery has been scheduled for a future date, please do not be alarmed if we have not contacted you to schedule an appointment more than a month prior to the surgery date.    Please be prepared to provide the following information during this appointment:            -Personal medical history                                               -  Medication and allergy list            -Any history of problems with anesthesia              -Recent lab work or diagnostic studies            -Please notify us  of any needs we should be aware of to provide the best care possible           -You will be provided with instructions on how to prepare for your surgery.    On The Day of Surgery:  You must have a driver to take you home after surgery, you will be asked not to drive for 24 hours following surgery.  Taxi, Gisele and  non-medical transport will not be acceptable means of transportation unless you have a responsible individual who will be traveling with you.  Visitors in the surgical area:   2 people will be able to visit you in your room once your preparation for surgery has been completed. During surgery, your visitors will be asked to wait in the Surgery Waiting Area.  It is not a requirement for them to stay, if they prefer to leave and come back.  Your visitor(s) will be given an update once the surgery has been completed.  No visitors are allowed in the initial recovery room to respect patient privacy and safety.  Once you are more awake and transfer to the secondary recovery area, or are transferred to an inpatient room, visitors will again be able to see you.  To respect and protect your privacy: We will ask on the day of surgery who your driver will be and what the contact number for that individual will be. We will ask if it is okay to share information with this individual, or if there is an alternative individual that we, or the surgeon, should contact to provide updates and information. If family or friends come to the surgical information desk requesting information about you, who you have not listed with us , no information will be given.   It may be helpful to designate someone as the main contact who will be responsible for updating your other friends and family.    PREADMIT TESTING OFFICE: [phone] SAME DAY SURGERY: [phone] We look forward to caring for you before and throughout the process of your surgery.

## 2024-07-12 ENCOUNTER — Ambulatory Visit: Admitting: Student in an Organized Health Care Education/Training Program

## 2024-07-16 ENCOUNTER — Ambulatory Visit: Admitting: Neurosurgery

## 2024-07-17 ENCOUNTER — Other Ambulatory Visit: Payer: Self-pay

## 2024-07-17 ENCOUNTER — Encounter
Admission: RE | Admit: 2024-07-17 | Discharge: 2024-07-17 | Disposition: A | Discharge status: Home / Self Care | Source: Ambulatory Visit | Attending: Neurosurgery | Admitting: Neurosurgery

## 2024-07-17 ENCOUNTER — Encounter: Payer: Self-pay | Admitting: Neurosurgery

## 2024-07-17 VITALS — BP 116/69 | HR 64 | Resp 16 | Ht 62.0 in | Wt 191.0 lb

## 2024-07-17 DIAGNOSIS — Z79899 Other long term (current) drug therapy: Secondary | ICD-10-CM | POA: Diagnosis not present

## 2024-07-17 DIAGNOSIS — I482 Chronic atrial fibrillation, unspecified: Secondary | ICD-10-CM | POA: Insufficient documentation

## 2024-07-17 DIAGNOSIS — I10 Essential (primary) hypertension: Secondary | ICD-10-CM | POA: Diagnosis not present

## 2024-07-17 DIAGNOSIS — Z01812 Encounter for preprocedural laboratory examination: Secondary | ICD-10-CM | POA: Insufficient documentation

## 2024-07-17 DIAGNOSIS — E876 Hypokalemia: Secondary | ICD-10-CM | POA: Diagnosis not present

## 2024-07-17 LAB — CBC
HCT: 36.2 % (ref 36.0–46.0)
Hemoglobin: 12.1 g/dL (ref 12.0–15.0)
MCH: 30.3 pg (ref 26.0–34.0)
MCHC: 33.4 g/dL (ref 30.0–36.0)
MCV: 90.5 fL (ref 80.0–100.0)
Platelets: 156 K/uL (ref 150–400)
RBC: 4 MIL/uL (ref 3.87–5.11)
RDW: 14.7 % (ref 11.5–15.5)
WBC: 7.1 K/uL (ref 4.0–10.5)
nRBC: 0 % (ref 0.0–0.2)

## 2024-07-17 LAB — BASIC METABOLIC PANEL WITH GFR
Anion gap: 10 (ref 5–15)
BUN: 13 mg/dL (ref 6–20)
CO2: 29 mmol/L (ref 22–32)
Calcium: 9.7 mg/dL (ref 8.9–10.3)
Chloride: 103 mmol/L (ref 98–111)
Creatinine, Ser: 0.85 mg/dL (ref 0.44–1.00)
GFR, Estimated: >60 mL/min (ref >60)
Glucose, Bld: 85 mg/dL (ref 70–99)
Potassium: 4.1 mmol/L (ref 3.5–5.1)
Sodium: 142 mmol/L (ref 135–145)

## 2024-07-17 NOTE — Patient Instructions (Addendum)
 Your procedure is scheduled on: Thursday 07/19/24 Report to the Registration Desk on the 1st floor of the Medical Mall. To find out your arrival time, please call [phone] between 1PM - 3PM on: Wednesday 07/18/24 If your arrival time is 6:00 am, do not arrive before that time as the Medical Mall entrance doors do not open until 6:00 am.  REMEMBER: Instructions that are not followed completely may result in serious medical risk, up to and including death; or upon the discretion of your surgeon and anesthesiologist your surgery may need to be rescheduled.  Do not eat food after midnight the night before surgery.  No gum chewing or hard candies.  You may however, drink CLEAR liquids up to 2 hours before you are scheduled to arrive for your surgery. Do not drink anything within 2 hours of your scheduled arrival time.  Clear liquids include: - water   - apple juice without pulp - gatorade (not RED colors) - black coffee or tea (Do NOT add milk or creamers to the coffee or tea) Do NOT drink anything that is not on this list.  One week prior to surgery: Stop Anti-inflammatories (NSAIDS) such as Advil, Aleve, Ibuprofen, Motrin, Naproxen, Naprosyn and Aspirin  based products such as Excedrin, Goody's Powder, BC Powder.  You may however, continue to take Tylenol  if needed for pain up until the day of surgery  Stop ALL OVER THE COUNTER supplements and vitamins until after surgery.  Continue taking all of your other prescription medications up until the day of surgery. Hold the [place]   ON THE DAY OF SURGERY ONLY TAKE THESE MEDICATIONS WITH SIPS OF WATER :  carisoprodol  (SOMA ) 350 MG tablet  HYDROcodone -acetaminophen  (NORCO) 10-325 MG tablet  pantoprazole  (PROTONIX ) 40 MG tablet  predniSONE  (DELTASONE ) 5 MG tablet  pregabalin  (LYRICA ) 75 MG capsule  sotalol  (BETAPACE ) 80 MG tablet   Use inhalers and nebulizer on the day of surgery and bring Albuterol  inhaler  to the hospital.  No Alcohol  for 24 hours before or after surgery.  No Smoking including e-cigarettes for 24 hours before surgery.  No chewable tobacco products for at least 6 hours before surgery.  No nicotine patches on the day of surgery.  Do not use any recreational drugs for at least a week (preferably 2 weeks) before your surgery.  Please be advised that the combination of cocaine and anesthesia may have negative outcomes, up to and including death. If you test positive for cocaine, your surgery will be cancelled.  On the morning of surgery brush your teeth with toothpaste and water , you may rinse your mouth with mouthwash if you wish. Do not swallow any toothpaste or mouthwash.  Use CHG Soap or wipes as directed on instruction sheet.  Do not wear lotions, powders, or perfumes or deodorant.   Do not shave body hair from the neck down 48 hours before surgery.  Wear comfortable clothing (specific to your surgery type) to the hospital.  Do not wear jewelry, make-up, hairpins, clips or nail polish.  For welded (permanent) jewelry: bracelets, anklets, waist bands, etc.  Please have this removed prior to surgery.  If it is not removed, there is a chance that hospital personnel will need to cut it off on the day of surgery.  Contact lenses, hearing aids and dentures may not be worn into surgery. Bring a case for your glasses  Do not bring valuables to the hospital. [facility] is not responsible for any missing/lost belongings or valuables.   Bring your C-PAP  to the hospital in case you may have to spend the night.   Notify your doctor if there is any change in your medical condition (cold, fever, infection).  Wear comfortable clothing (specific to your surgery type) to the hospital.  After surgery, you can help prevent lung complications by doing breathing exercises.  Take deep breaths and cough every 1-2 hours. Your doctor may order a device called an Incentive Spirometer to help you take deep  breaths. When coughing or sneezing, hold a pillow firmly against your incision with both hands. This is called "splinting." Doing this helps protect your incision. It also decreases belly discomfort.  If you are being discharged the day of surgery, you will not be allowed to drive home. You will need a responsible individual to drive you home and stay with you for 24 hours after surgery.   Please call the Pre-admissions Testing Dept. at [phone] if you have any questions about these instructions.  Surgery Visitation Policy:  Patients having surgery or a procedure may have two visitors.  Children under the age of 32 must have an adult with them who is not the patient.   Merchandiser, retail to address health-related social needs:  [email or web address]     Preparing for Surgery with CHLORHEXIDINE  GLUCONATE (CHG) Soap  Chlorhexidine  Gluconate (CHG) Soap  o An antiseptic cleaner that kills germs and bonds with the skin to continue killing germs even after washing  o Used for showering the night before surgery and morning of surgery  Before surgery, you can play an important role by reducing the number of germs on your skin.  CHG (Chlorhexidine  gluconate) soap is an antiseptic cleanser which kills germs and bonds with the skin to continue killing germs even after washing.  Please do not use if you have an allergy to CHG or antibacterial soaps. If your skin becomes reddened/irritated stop using the CHG.  1. Shower the NIGHT BEFORE SURGERY and the MORNING OF SURGERY with CHG soap.  2. If you choose to wash your hair, wash your hair first as usual with your normal shampoo.  3. After shampooing, rinse your hair and body thoroughly to remove the shampoo.  4. Use CHG as you would any other liquid soap. You can apply CHG directly to the skin and wash gently with a scrungie or a clean washcloth.  5. Apply the CHG soap to your body only from the neck down. Do not  use on open wounds or open sores. Avoid contact with your eyes, ears, mouth, and genitals (private parts). Wash face and genitals (private parts) with your normal soap.  6. Wash thoroughly, paying special attention to the area where your surgery will be performed.  7. Thoroughly rinse your body with warm water .  8. Do not shower/wash with your normal soap after using and rinsing off the CHG soap.  9. Pat yourself dry with a clean towel.  10. Wear clean pajamas to bed the night before surgery.  12. Place clean sheets on your bed the night of your first shower and do not sleep with pets.  13. Shower again with the CHG soap on the day of surgery prior to arriving at the hospital.  14. Do not apply any deodorants/lotions/powders.  15. Please wear clean clothes to the hospital.

## 2024-07-18 ENCOUNTER — Encounter: Payer: Self-pay | Admitting: Neurosurgery

## 2024-07-18 NOTE — Progress Notes (Signed)
 Perioperative / Anesthesia Services  Pre-Admission Testing Clinical Review / Pre-Operative Anesthesia Consult  Date: 07/18/24  PATIENT DEMOGRAPHICS: Name: Cassandra Brown DOB: 1966-06-22 MRN:   [id number]  Note: Available PAT nursing documentation and vital signs have been reviewed. Clinical nursing staff has updated patient's PMH/PSHx, current medication list, and drug allergies/intolerances to ensure complete and comprehensive history available to assist care teams in MDM as it pertains to the aforementioned surgical procedure and anticipated anesthetic course. Extensive review of available clinical information personally performed. [place] PMH and PSHx updated with any diagnoses/procedures that  may have been inadvertently omitted during his intake with the pre-admission testing department's nursing staff.  PLANNED SURGICAL PROCEDURE(S):   Case: [id number] Date/Time: 07/19/24 0949   Procedure: LUMBAR SPINAL CORD STIMULATOR REMOVAL (Left) - LEFT INTERNAL PULSE GENERATOR REMOVAL   Anesthesia type: General   Diagnosis:      Migration of spinal cord stimulator, initial encounter (HCC) [T85.122A]     Malfunction of spinal cord stimulator, initial encounter (HCC) [T85.192A]   Pre-op diagnosis:      T85.122A Migration of spinal cord stimulator, initial encounter     T85.192A Malfunction of spinal cord stimulator, initial encounter   Location: [facility] OR ROOM 03 / [facility]   Surgeons: Claudene Penne ORN, MD        CLINICAL DISCUSSION: Cassandra Brown is a 58 y.o. female who is submitted for pre-surgical anesthesia review and clearance prior to her undergoing the above procedure. Patient is a Former Smoker (99 pack years; quit 01/2011). 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   Patient is followed by cardiology Andrea, MD). She was last seen in the cardiology clinic on 06/08/2024; notes reviewed. At the time of her clinic visit, patient experiencing episodes of nocturnal irregular heartbeat.  Despite beta-blocker therapy, episodes becoming more frequent in nature.  Due to associated bradycardia, beta-blocker dose limited.  She denies any episodes of chest pain, shortness breath, PND, orthopnea, significant peripheral edema, vertiginous symptoms, or presyncope/syncope.  Patient with a past medical history significant for cardiovascular diagnoses.  Documented physical exam was grossly benign, providing no evidence of acute exacerbation and/or decompensation of the patient's known cardiovascular conditions.  TEE with bubble study performed on 03/10/2021 revealing a normal left ventricular systolic function with an EF of >55%.  There was no evidence of LAA thrombus.  Saline microcavitation study showed atrial level shunting at rest.  Patient went on to have an LAA closure the following day (03/11/2021).  24 mm Watchman device was placed.  Most recent TTE performed on 09/05/2023 revealed a normal left ventricular systolic function with an EF of >55%. There were no regional wall motion abnormalities.  Left ventricular diastolic Doppler parameters normal. Right ventricular size and function normal.  RVSP = 31.1 mmHg.  There was trivial mitral and mild tricuspid and pulmonary valve regurgitation.  All transvalvular gradients were noted to be normal providing no evidence of hemodynamically significant valvular stenosis. Aorta normal in size with no evidence of ectasia or aneurysmal dilatation.  Patient with an atrial fibrillation diagnosis;  CHA2DS2-VASc Score = 6 (sex, HFpEF, HTN, PE/DVT x 2, vascular disease).again, patient is status post LAA closure device placement (Watchman) on  03/11/2021.  Her rate and rhythm are currently being maintained on oral sotalol  + propranolol .  She does not require chronic oral anticoagulation at this point.  Despite LAA closure and use of beta-blocker therapy, patient continues to have irregular rhythm.  Long-term cardiac event monitor study ordered.  If atrial fibrillation confirmed, cardiac ablation recommended.  Blood pressure well-controlled at 116/60 mmHg on currently prescribed diuretic (furosemide ) and beta-blocker (sotalol  + propranolol ) therapies.  Patient is on rosuvastatin  for her HLD diagnosis and further ASCVD prevention.  She is not diabetic.  Patient does have an OSAH diagnosis and is reportedly compliant with prescribed nocturnal PAP therapy.  Functional capacity limited by patient's multiple medical comorbidities.  With that said, patient able to complete all of her ADLs/IADLs without significant cardiovascular limitation.  Per the DASI, patient is able to achieve at least 4 METS of physical activity without experiencing any significant increase of angina/anginal equivalent symptoms.  No changes were made to her medication regimen.  Patient follow-up with outpatient cardiology in 1 to 2 months or sooner if needed.  Cassandra Brown is scheduled for an elective LUMBAR SPINAL CORD STIMULATOR REMOVAL (Left) on 07/19/2024 with Dr. Penne Sharps, MD.  Given patient's past medical history significant for cardiovascular diagnoses, presurgical cardiac clearance was sought by the PAT team. Per cardiology, this patient is optimized for surgery and may proceed with the planned procedural course with a LOW risk of significant perioperative cardiovascular complications.  In review of her medication reconciliation, the patient is not noted to be taking any type of anticoagulation or antiplatelet therapies that  would need to be held during her perioperative course.  Patient denies previous perioperative complications with anesthesia in the past. In review her EMR, it is noted that patient underwent a general anesthetic course at [facility] ([facility]) in 07/2023 without documented complications.   MOST RECENT VITAL SIGNS:    07/17/2024   11:01 AM 07/11/2024    1:15 PM 07/04/2024    2:03 PM  Vitals with BMI  Height 5' 2 5' 2 5' 2  Weight 191 lbs 191 lbs 189 lbs  BMI 34.93 34.93 34.56  Systolic 116 126 881  Diastolic 69 88 73  Pulse 64  63   PROVIDERS/SPECIALISTS: NOTE: Primary physician provider listed below. Patient may have been seen by APP or partner within same practice.   PROVIDER ROLE / SPECIALTY LAST OV  Sharps Penne ORN, MD Neurosurgery (Surgeon) 07/11/2024  Rudolpho Norleen BIRCH, MD Primary Care Provider 02/08/2024  Ammon Blunt, MD Cardiology 04/09/2024  Debby Drivers, MD Electrophysiology 06/08/2024  Marcelino Nurse, MD Pain Management 07/04/2024  Parris Manna, MD Pulmonary Medicine 04/04/2024  Tobie Solo, MD Rheumatology 04/04/2024   ALLERGIES: Allergies  Allergen Reactions   Cyclobenzaprine Hives   Ibuprofen Hives   Mepolizumab Shortness Of Breath   Morphine Itching and Swelling   Meperidine Itching and Swelling   Sulfa Antibiotics Swelling   Onion Itching    Can use onion powder    CURRENT HOME MEDICATIONS: No current facility-administered medications for this encounter.    albuterol  (VENTOLIN  HFA) 108 (90 Base) MCG/ACT inhaler   BAC 50-325-40 MG tablet   bisacodyl (DULCOLAX) 5 MG EC tablet   carisoprodol  (SOMA ) 350 MG tablet   cholecalciferol  (VITAMIN D3) 25 MCG (1000 UNIT) tablet   clobetasol  cream (TEMOVATE ) 0.05 %   EMGALITY 120 MG/ML SOAJ   EPINEPHrine  0.3 mg/0.3 mL IJ SOAJ injection   ferrous sulfate 325 (65 FE) MG tablet   folic acid  (  FOLVITE ) 1 MG tablet   furosemide  (LASIX ) 20 MG tablet   gentamicin  ointment (GARAMYCIN ) 0.1 %    HYDROcodone -acetaminophen  (NORCO) 10-325 MG tablet   ipratropium-albuterol  (DUONEB) 0.5-2.5 (3) MG/3ML SOLN   methotrexate  2.5 MG tablet   montelukast  (SINGULAIR ) 10 MG tablet   naloxone  (NARCAN ) nasal spray 4 mg/0.1 mL   NURTEC 75 MG TBDP   pantoprazole  (PROTONIX ) 40 MG tablet   pentoxifylline  (TRENTAL ) 400 MG CR tablet   pimecrolimus  (ELIDEL ) 1 % cream   predniSONE  (DELTASONE ) 5 MG tablet   pregabalin  (LYRICA ) 75 MG capsule   promethazine  (PHENERGAN ) 25 MG tablet   propranolol  ER (INDERAL  LA) 80 MG 24 hr capsule   rosuvastatin  (CRESTOR ) 10 MG tablet   sodium chloride  (OCEAN) 0.65 % SOLN nasal spray   sotalol  (BETAPACE ) 80 MG tablet   Specialty Vitamins Products (CENTRUM PERFORMANCE) TABS   SPIRIVA  RESPIMAT 1.25 MCG/ACT AERS   sucralfate  (CARAFATE ) 1 g tablet   triamcinolone  cream (KENALOG ) 0.1 %   riTUXimab (RITUXAN IV)   TUBERCULIN SYR 1CC/25GX5/8 25G X 5/8 1 ML MISC   HISTORY: Past Medical History:  Diagnosis Date   (HFpEF) heart failure with preserved ejection fraction (HCC)    Abnormal ANCA (antineutrophil cytoplasmic antibody)    Allergic rhinitis due to pollen    Angina pectoris (HCC)    Aortic atherosclerosis (HCC)    Asthma-COPD overlap syndrome (HCC)    Atrial fibrillation (HCC)    a.) CHA2DS2VASc = 6 (sex, HFpEF, HTN, PE/DVT x 2, vascular disease) as of 07/17/2024; b.) s/p LAA closure (Watchman) 03/11/2021; c.) rate/rhythm maintained on oral propranolol  + sotolol; no chronic OAC   Bradycardia    CAD (coronary artery disease)    Chronic maxillary sinusitis    Chronic pain syndrome (back)    Chronic right sacroiliac joint pain    Chronic, continuous use of opioids    a.) followed by pain management; has nalaoxone Rx available   COVID-19 12/2021   Deep vein thrombosis (DVT) of left lower extremity (HCC) 12/2021   Full dentures    GERD (gastroesophageal reflux disease)    History of GI bleed    History of kidney stones    History of pulmonary embolism    HLD  (hyperlipidemia)    HTN (hypertension)    ILD (interstitial lung disease) (HCC)    Leakage of Watchman left atrial appendage closure device    Leukocytoclastic vasculitis (HCC)    Leukocytosis    Lumbar radiculopathy    Migraine headache    Nondiabetic gastroparesis    Obesity    OSA on CPAP    Osteoarthritis    Osteopenia    Patent foramen ovale    Pedal edema    Pneumonia due to COVID-19 virus    Rheumatoid arthritis (HCC)    a.) Tx'd with long term MTX + prednisone    Scoliosis of thoracolumbar spine    Spinal cord stimulator status    Spinal stenosis of lumbar region    Unequal leg length    Wears hearing aid in both ears    Wegener's disease, pulmonary    Past Surgical History:  Procedure Laterality Date   APPENDECTOMY     CHOLECYSTECTOMY     COLONOSCOPY N/A 04/30/2022   Procedure: COLONOSCOPY;  Surgeon: Onita Elspeth Sharper, DO;  Location: [facility] ENDOSCOPY;  Service: Gastroenterology;  Laterality: N/A;   COLONOSCOPY WITH PROPOFOL  N/A 01/14/2023   Procedure: COLONOSCOPY WITH PROPOFOL ;  Surgeon: Onita Elspeth Sharper, DO;  Location: [facility] ENDOSCOPY;  Service:  Gastroenterology;  Laterality: N/A;   ESOPHAGOGASTRODUODENOSCOPY N/A 04/30/2022   Procedure: ESOPHAGOGASTRODUODENOSCOPY (EGD);  Surgeon: Onita Elspeth Sharper, DO;  Location: [facility] ENDOSCOPY;  Service: Gastroenterology;  Laterality: N/A;   ESOPHAGOGASTRODUODENOSCOPY (EGD) WITH PROPOFOL  N/A 01/14/2023   Procedure: ESOPHAGOGASTRODUODENOSCOPY (EGD) WITH PROPOFOL ;  Surgeon: Onita Elspeth Sharper, DO;  Location: [facility] ENDOSCOPY;  Service: Gastroenterology;  Laterality: N/A;   KNEE ARTHROPLASTY Right    KNEE ARTHROSCOPY WITH MENISCAL REPAIR Left    LEFT ATRIAL APPENDAGE OCCLUSION N/A 03/11/2021   Procedure: LEFT ATRIAL APPENDAGE OCCLUSION (WATCHMAN DEVICE); Location: [facility]; Surgeon: Franky Ned, MD   MYRINGOTOMY WITH TUBE PLACEMENT Bilateral 08/17/2023   Procedure: MYRINGOTOMY WITH TUBE PLACEMENT;  Surgeon: Milissa Hamming,  MD;  Location: [facility];  Service: ENT;  Laterality: Bilateral;   NASAL HEMORRHAGE CONTROL N/A 05/13/2021   Procedure: EPISTAXIS CONTROL;  Surgeon: Milissa Hamming, MD;  Location: [facility] ORS;  Service: ENT;  Laterality: N/A;   PULSE GENERATOR IMPLANT Left 06/23/2020   Procedure: LEFT FLANK PULSE GENERATOR IMPLANT;  Surgeon: Bluford Elspeth, MD;  Location: [facility] ORS;  Service: Neurosurgery;  Laterality: Left;   SHOULDER ARTHROSCOPY Bilateral    THORACIC LAMINECTOMY FOR SPINAL CORD STIMULATOR Bilateral 06/23/2020   Procedure: THORACIC SPINAL CORD STIMULATOR;  Surgeon: Bluford Elspeth, MD;  Location: [facility] ORS;  Service: Neurosurgery;  Laterality: Bilateral;   VIDEO BRONCHOSCOPY WITH ENDOBRONCHIAL NAVIGATION N/A 02/27/2021   Procedure: VIDEO BRONCHOSCOPY WITH ENDOBRONCHIAL NAVIGATION;  Surgeon: Parris Manna, MD;  Location: [facility] ORS;  Service: Thoracic;  Laterality: N/A;   VIDEO BRONCHOSCOPY WITH ENDOBRONCHIAL ULTRASOUND N/A 02/27/2021   Procedure: VIDEO BRONCHOSCOPY WITH ENDOBRONCHIAL ULTRASOUND;  Surgeon: Parris Manna, MD;  Location: [facility] ORS;  Service: Thoracic;  Laterality: N/A;   No family history on file. Social History   Tobacco Use   Smoking status: Former    Current packs/day: 0.00    Average packs/day: 3.0 packs/day for 33.0 years (99.0 ttl pk-yrs)    Types: Cigarettes    Start date: 02/06/1978    Quit date: 02/06/2011    Years since quitting: 13.4   Smokeless tobacco: Never  Substance Use Topics   Alcohol use: Not Currently   LABS:  Hospital Outpatient Visit on 07/17/2024  Component Date Value Ref Range Status   Sodium 07/17/2024 142  135 - 145 mmol/L Final   Potassium 07/17/2024 4.1  3.5 - 5.1 mmol/L Final   Chloride 07/17/2024 103  98 - 111 mmol/L Final   CO2 07/17/2024 29  22 - 32 mmol/L Final   Glucose, Bld 07/17/2024 85  70 - 99 mg/dL Final   Glucose reference range applies only to samples taken after fasting for at least 8 hours.   BUN 07/17/2024 13  6 - 20  mg/dL Final   Creatinine, Ser 07/17/2024 0.85  0.44 - 1.00 mg/dL Final   Calcium  07/17/2024 9.7  8.9 - 10.3 mg/dL Final   GFR, Estimated 07/17/2024 >60  >60 mL/min Final   Comment: (NOTE) Calculated using the CKD-EPI Creatinine Equation (2021)    Anion gap 07/17/2024 10  5 - 15 Final   Performed at [facility], [address] [place], [place] [address]   WBC 07/17/2024 7.1  4.0 - 10.5 K/uL Final   RBC 07/17/2024 4.00  3.87 - 5.11 MIL/uL Final   Hemoglobin 07/17/2024 12.1  12.0 - 15.0 g/dL Final   HCT 92/77/7974 36.2  36.0 - 46.0 % Final   MCV 07/17/2024 90.5  80.0 - 100.0 fL Final   MCH 07/17/2024 30.3  26.0 -  34.0 pg Final   MCHC 07/17/2024 33.4  30.0 - 36.0 g/dL Final   RDW 92/77/7974 14.7  11.5 - 15.5 % Final   Platelets 07/17/2024 156  150 - 400 K/uL Final   nRBC 07/17/2024 0.0  0.0 - 0.2 % Final   Performed at [facility], [address] [place], [place] [address]    ECG: Date: 06/08/2024  Time ECG obtained: 1208 PM Rate: 57 bpm Rhythm: sinus bradycardia Axis (leads I and aVF): normal Intervals: PR 132 ms. QRS 102 ms. QTc 441 ms. ST segment and T wave changes: No evidence of acute T wave abnormalities or significant ST segment elevation or depression.  Evidence of a possible, age undetermined, prior infarct:  No Comparison: Similar to previous tracing obtained on 04/09/2024 NOTE: Tracing obtained at [facility]; unable for review. Above based on cardiologist's interpretation.    IMAGING / PROCEDURES: DG LUMBAR AND THORACIC SPINE 2-3 VIEWS performed on 07/11/2024 Spinal stimulator wiring tip at T7-8, previously mid T7 level with about 5 mm interval pullback. Thoracic and lumbar scoliosis and degenerative change. Osteopenia without evidence of fractures.  PULMONARY FUNCTION TESTING performed on 03/19/2024 FVC was 2.28 L, 76 % of predicted FEV1 was 1.82 L, 77 % of predicted FEV1/FVC ratio was 100 % of predicted FEF 25-75% liters per second  was 86 % of predicted TLC was 91 % of predicted RV was 126 % of predicted DLCO was 73 % of predicted DLCO/VA was 96 % of predicted Good patient effort with fair repeatability. Pt c/o of fatigue and SOBOE. I walked her to front door of clinic with oximetry and sat's dropped from 97% @ rest to 93% on our short walk.   CT CHEST WO CONTRAST performed on 11/23/2023 Stable right middle lobe pulmonary nodule from prior exam, overall decreased in size from 2022. Findings consistent with benign etiology. Multifocal scarring within both lungs, some of which appears nodular. This is stable. Left apical ground-glass nodule measuring 9 mm. This is likely an area of scarring with solid nodule present on remote prior exam, however recommend continued CT follow-up in 2 years to document stability. New 3 mm left apical pulmonary nodule, this can be reassessed on follow-up imaging. Mild emphysema Aortic atherosclerosis.   Coronary artery calcifications.   MR LUMBAR SPINE WO CONTRAST performed on 11/10/2023 Diffuse lumbar spine spondylosis as described above. Moderate-severe left foraminal stenosis at L4-5.  Severe left foraminal stenosis at L5-S1. No acute osseous injury of the lumbar spine.  TRANSTHORACIC ECHOCARDIOGRAM performed on 09/05/2023 Normal left ventricular systolic function with an EF of >55% No regional wall motion abnormalities Normal right ventricular size and function Trivial MR Mild TR and PR Normal gradients; no valvular stenosis  IMPRESSION AND PLAN: Cassandra Brown has been referred for pre-anesthesia review and clearance prior to her undergoing the planned anesthetic and procedural courses. Available labs, pertinent testing, and imaging results were personally reviewed by me in preparation for upcoming operative/procedural course. [facility] medical record has been updated following extensive record review and patient interview with PAT staff.   This patient has been  appropriately cleared by cardiology with an overall LOW risk of patient experiencing significant perioperative cardiovascular complications. Based on clinical review performed today (07/18/24), barring any significant acute changes in the patient's overall condition, it is anticipated that she will be able to proceed with the planned surgical intervention. Any acute changes in clinical condition may necessitate her procedure being postponed and/or cancelled. Patient will meet with anesthesia  team (MD and/or CRNA) on the day of her procedure for preoperative evaluation/assessment. Questions regarding anesthetic course will be fielded at that time.   Pre-surgical instructions were reviewed with the patient during his PAT appointment, and questions were fielded to satisfaction by PAT clinical staff. She has been instructed on which medications that she will need to hold prior to surgery, as well as the ones that have been deemed safe/appropriate to take on the day of her procedure. As part of the general education provided by PAT, patient made aware both verbally and in writing, that she would need to abstain from the use of any illegal substances during her perioperative course. She was advised that failure to follow the provided instructions could necessitate case cancellation or result in serious perioperative complications up to and including death. Patient encouraged to contact PAT and/or her surgeon's office to discuss any questions or concerns that may arise prior to surgery; verbalized understanding.   Dorise Pereyra, MSN, APRN, FNP-C, CEN [facility]  Perioperative Services Nurse Practitioner Phone: [phone] Fax: [phone] 07/18/24 12:41 PM  NOTE: This note has been prepared using Dragon dictation software. Despite my best ability to proofread, there is always the potential that unintentional transcriptional errors may still occur from this process.

## 2024-07-19 ENCOUNTER — Encounter: Payer: Self-pay | Admitting: Neurosurgery

## 2024-07-19 ENCOUNTER — Other Ambulatory Visit: Payer: Self-pay

## 2024-07-19 ENCOUNTER — Ambulatory Visit: Admitting: Urgent Care

## 2024-07-19 ENCOUNTER — Encounter
Admission: RE | Disposition: A | Discharge status: Home / Self Care | Payer: Self-pay | Source: Home / Self Care | Attending: Neurosurgery

## 2024-07-19 ENCOUNTER — Ambulatory Visit
Admission: RE | Admit: 2024-07-19 | Discharge: 2024-07-19 | Disposition: A | Discharge status: Home / Self Care | Attending: Neurosurgery | Admitting: Neurosurgery

## 2024-07-19 DIAGNOSIS — K219 Gastro-esophageal reflux disease without esophagitis: Secondary | ICD-10-CM | POA: Diagnosis not present

## 2024-07-19 DIAGNOSIS — G894 Chronic pain syndrome: Secondary | ICD-10-CM | POA: Diagnosis not present

## 2024-07-19 DIAGNOSIS — I4891 Unspecified atrial fibrillation: Secondary | ICD-10-CM | POA: Diagnosis not present

## 2024-07-19 DIAGNOSIS — Z7952 Long term (current) use of systemic steroids: Secondary | ICD-10-CM | POA: Diagnosis not present

## 2024-07-19 DIAGNOSIS — T85123A Displacement of implanted electronic neurostimulator, generator, initial encounter: Secondary | ICD-10-CM | POA: Diagnosis not present

## 2024-07-19 DIAGNOSIS — T85122A Displacement of implanted electronic neurostimulator (electrode) of spinal cord, initial encounter: Secondary | ICD-10-CM | POA: Diagnosis present

## 2024-07-19 DIAGNOSIS — I110 Hypertensive heart disease with heart failure: Secondary | ICD-10-CM | POA: Insufficient documentation

## 2024-07-19 DIAGNOSIS — Y758 Miscellaneous neurological devices associated with adverse incidents, not elsewhere classified: Secondary | ICD-10-CM | POA: Diagnosis not present

## 2024-07-19 DIAGNOSIS — G4733 Obstructive sleep apnea (adult) (pediatric): Secondary | ICD-10-CM | POA: Insufficient documentation

## 2024-07-19 DIAGNOSIS — T85192A Other mechanical complication of implanted electronic neurostimulator (electrode) of spinal cord, initial encounter: Secondary | ICD-10-CM | POA: Diagnosis present

## 2024-07-19 DIAGNOSIS — Z86711 Personal history of pulmonary embolism: Secondary | ICD-10-CM | POA: Diagnosis not present

## 2024-07-19 DIAGNOSIS — E669 Obesity, unspecified: Secondary | ICD-10-CM | POA: Insufficient documentation

## 2024-07-19 DIAGNOSIS — I5032 Chronic diastolic (congestive) heart failure: Secondary | ICD-10-CM | POA: Insufficient documentation

## 2024-07-19 DIAGNOSIS — Z6834 Body mass index (BMI) 34.0-34.9, adult: Secondary | ICD-10-CM | POA: Diagnosis not present

## 2024-07-19 DIAGNOSIS — J4489 Other specified chronic obstructive pulmonary disease: Secondary | ICD-10-CM | POA: Insufficient documentation

## 2024-07-19 DIAGNOSIS — M069 Rheumatoid arthritis, unspecified: Secondary | ICD-10-CM | POA: Insufficient documentation

## 2024-07-19 DIAGNOSIS — Z86718 Personal history of other venous thrombosis and embolism: Secondary | ICD-10-CM | POA: Diagnosis not present

## 2024-07-19 DIAGNOSIS — Z01818 Encounter for other preprocedural examination: Secondary | ICD-10-CM

## 2024-07-19 HISTORY — DX: 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: M31.30

## 2024-07-19 HISTORY — DX: Interstitial pulmonary disease, unspecified: J84.9

## 2024-07-19 HISTORY — DX: Bradycardia, unspecified: R00.1

## 2024-07-19 HISTORY — DX: Unspecified diastolic (congestive) heart failure: I50.30

## 2024-07-19 HISTORY — DX: Opioid use, unspecified, uncomplicated: F11.90

## 2024-07-19 HISTORY — DX: Hyperlipidemia, unspecified: E78.5

## 2024-07-19 HISTORY — DX: Other specified abnormal immunological findings in serum: R76.8

## 2024-07-19 HISTORY — DX: Presence of dental prosthetic device (complete) (partial): Z97.2

## 2024-07-19 HISTORY — DX: COVID-19: U07.1

## 2024-07-19 HISTORY — DX: Radiculopathy, lumbar region: M54.16

## 2024-07-19 HISTORY — DX: Localized edema: R60.0

## 2024-07-19 HISTORY — DX: Pneumonia due to coronavirus disease 2019: J12.82

## 2024-07-19 HISTORY — DX: Scoliosis, unspecified: M41.9

## 2024-07-19 HISTORY — DX: Other specified disorders of bone density and structure, unspecified site: M85.80

## 2024-07-19 HISTORY — PX: SPINAL CORD STIMULATOR REMOVAL: SHX5379

## 2024-07-19 HISTORY — DX: Atherosclerosis of aorta: I70.0

## 2024-07-19 HISTORY — DX: Essential (primary) hypertension: I10

## 2024-07-19 HISTORY — DX: Atherosclerotic heart disease of native coronary artery without angina pectoris: I25.10

## 2024-07-19 HISTORY — DX: Hypersensitivity angiitis: M31.0

## 2024-07-19 HISTORY — DX: Gastroparesis: K31.84

## 2024-07-19 HISTORY — DX: Complete loss of teeth, unspecified cause, unspecified class: K08.109

## 2024-07-19 HISTORY — DX: Unspecified osteoarthritis, unspecified site: M19.90

## 2024-07-19 SURGERY — LUMBAR SPINAL CORD STIMULATOR REMOVAL
Anesthesia: Monitor Anesthesia Care | Laterality: Left

## 2024-07-19 MED ORDER — ORAL CARE MOUTH RINSE
15.0000 mL | Freq: Once | OROMUCOSAL | Status: AC
Start: 2024-07-19 — End: 2024-07-19

## 2024-07-19 MED ORDER — IRRISEPT - 450ML BOTTLE WITH 0.05% CHG IN STERILE WATER, USP 99.95% OPTIME
TOPICAL | Status: DC | PRN
Start: 2024-07-19 — End: 2024-07-19
  Administered 2024-07-19: 450 mL via TOPICAL

## 2024-07-19 MED ORDER — DEXAMETHASONE SODIUM PHOSPHATE 10 MG/ML IJ SOLN
INTRAMUSCULAR | Status: AC
  Filled 2024-07-19: qty 1

## 2024-07-19 MED ORDER — FENTANYL CITRATE (PF) 100 MCG/2ML IJ SOLN
INTRAMUSCULAR | Status: AC
Start: 2024-07-19 — End: 2024-07-19
  Filled 2024-07-19: qty 2

## 2024-07-19 MED ORDER — EPHEDRINE 5 MG/ML INJ
INTRAVENOUS | Status: AC
  Filled 2024-07-19: qty 5

## 2024-07-19 MED ORDER — OXYCODONE HCL 5 MG PO TABS: 5.0000 mg | ORAL_TABLET | Freq: Once | ORAL | Status: DC | PRN

## 2024-07-19 MED ORDER — CHLORHEXIDINE GLUCONATE 0.12 % MT SOLN
OROMUCOSAL | Status: AC
  Filled 2024-07-19: qty 15

## 2024-07-19 MED ORDER — DIPHENHYDRAMINE HCL 50 MG/ML IJ SOLN
INTRAMUSCULAR | Status: AC
  Filled 2024-07-19: qty 1

## 2024-07-19 MED ORDER — BUPIVACAINE LIPOSOME 1.3 % IJ SUSP
INTRAMUSCULAR | Status: AC
  Filled 2024-07-19: qty 10

## 2024-07-19 MED ORDER — 0.9 % SODIUM CHLORIDE (POUR BTL) OPTIME
TOPICAL | Status: DC | PRN
Start: 2024-07-19 — End: 2024-07-19
  Administered 2024-07-19: 500 mL

## 2024-07-19 MED ORDER — VANCOMYCIN HCL IN DEXTROSE 1-5 GM/200ML-% IV SOLN
1000.0000 mg | Freq: Once | INTRAVENOUS | Status: AC
  Administered 2024-07-19: 1000 mg via INTRAVENOUS

## 2024-07-19 MED ORDER — GLYCOPYRROLATE 0.2 MG/ML IJ SOLN
INTRAMUSCULAR | Status: DC | PRN
  Administered 2024-07-19: .1 mg via INTRAVENOUS

## 2024-07-19 MED ORDER — PHENYLEPHRINE 80 MCG/ML (10ML) SYRINGE FOR IV PUSH (FOR BLOOD PRESSURE SUPPORT)
PREFILLED_SYRINGE | INTRAVENOUS | Status: DC | PRN
  Administered 2024-07-19 (×2): 80 ug via INTRAVENOUS

## 2024-07-19 MED ORDER — ONDANSETRON HCL 4 MG/2ML IJ SOLN
INTRAMUSCULAR | Status: DC | PRN
Start: 2024-07-19 — End: 2024-07-19
  Administered 2024-07-19: 4 mg via INTRAVENOUS

## 2024-07-19 MED ORDER — LIDOCAINE HCL (PF) 2 % IJ SOLN
INTRAMUSCULAR | Status: AC
  Filled 2024-07-19: qty 5

## 2024-07-19 MED ORDER — BUPIVACAINE HCL (PF) 0.5 % IJ SOLN
INTRAMUSCULAR | Status: AC
  Filled 2024-07-19: qty 30

## 2024-07-19 MED ORDER — LIDOCAINE HCL (CARDIAC) PF 100 MG/5ML IV SOSY
PREFILLED_SYRINGE | INTRAVENOUS | Status: DC | PRN
  Administered 2024-07-19: 40 mg via INTRAVENOUS

## 2024-07-19 MED ORDER — BUPIVACAINE-EPINEPHRINE (PF) 0.5% -1:200000 IJ SOLN
INTRAMUSCULAR | Status: DC | PRN
  Administered 2024-07-19: 6 mL

## 2024-07-19 MED ORDER — EPHEDRINE SULFATE-NACL 50-0.9 MG/10ML-% IV SOSY
PREFILLED_SYRINGE | INTRAVENOUS | Status: DC | PRN
  Administered 2024-07-19: 5 mg via INTRAVENOUS

## 2024-07-19 MED ORDER — SODIUM CHLORIDE (PF) 0.9 % IJ SOLN
INTRAMUSCULAR | Status: AC
  Filled 2024-07-19: qty 10

## 2024-07-19 MED ORDER — DEXMEDETOMIDINE HCL IN NACL 80 MCG/20ML IV SOLN
INTRAVENOUS | Status: DC | PRN
  Administered 2024-07-19: 4 ug via INTRAVENOUS

## 2024-07-19 MED ORDER — CEFAZOLIN SODIUM-DEXTROSE 2-4 GM/100ML-% IV SOLN
INTRAVENOUS | Status: AC
  Filled 2024-07-19: qty 100

## 2024-07-19 MED ORDER — PHENYLEPHRINE 80 MCG/ML (10ML) SYRINGE FOR IV PUSH (FOR BLOOD PRESSURE SUPPORT)
PREFILLED_SYRINGE | INTRAVENOUS | Status: AC
  Filled 2024-07-19: qty 10

## 2024-07-19 MED ORDER — MIDAZOLAM HCL 2 MG/2ML IJ SOLN
INTRAMUSCULAR | Status: DC | PRN
  Administered 2024-07-19: 2 mg via INTRAVENOUS

## 2024-07-19 MED ORDER — LACTATED RINGERS IV SOLN: INTRAVENOUS | Status: DC

## 2024-07-19 MED ORDER — DIPHENHYDRAMINE HCL 50 MG/ML IJ SOLN
INTRAMUSCULAR | Status: DC | PRN
  Administered 2024-07-19: 12.5 mg via INTRAVENOUS

## 2024-07-19 MED ORDER — PROPOFOL 500 MG/50ML IV EMUL
INTRAVENOUS | Status: DC | PRN
Start: 2024-07-19 — End: 2024-07-19
  Administered 2024-07-19: 50 ug/kg/min via INTRAVENOUS

## 2024-07-19 MED ORDER — MIDAZOLAM HCL 2 MG/2ML IJ SOLN
INTRAMUSCULAR | Status: AC
  Filled 2024-07-19: qty 2

## 2024-07-19 MED ORDER — METHYLPREDNISOLONE SODIUM SUCC 125 MG IJ SOLR
INTRAMUSCULAR | Status: DC | PRN
  Administered 2024-07-19: 125 mg via INTRAVENOUS

## 2024-07-19 MED ORDER — VANCOMYCIN HCL IN DEXTROSE 1-5 GM/200ML-% IV SOLN
INTRAVENOUS | Status: AC
  Filled 2024-07-19: qty 200

## 2024-07-19 MED ORDER — CHLORHEXIDINE GLUCONATE 0.12 % MT SOLN
15.0000 mL | Freq: Once | OROMUCOSAL | Status: AC
  Administered 2024-07-19: 15 mL via OROMUCOSAL

## 2024-07-19 MED ORDER — FENTANYL CITRATE (PF) 100 MCG/2ML IJ SOLN: 25.0000 ug | INTRAMUSCULAR | Status: DC | PRN

## 2024-07-19 MED ORDER — BUPIVACAINE-EPINEPHRINE (PF) 0.5% -1:200000 IJ SOLN
INTRAMUSCULAR | Status: AC
  Filled 2024-07-19: qty 10

## 2024-07-19 MED ORDER — PROPOFOL 10 MG/ML IV BOLUS
INTRAVENOUS | Status: AC
  Filled 2024-07-19: qty 40

## 2024-07-19 MED ORDER — SODIUM CHLORIDE (PF) 0.9 % IJ SOLN
INTRAMUSCULAR | Status: DC | PRN
  Administered 2024-07-19: 30 mL via INTRAMUSCULAR

## 2024-07-19 MED ORDER — CEFAZOLIN SODIUM-DEXTROSE 2-4 GM/100ML-% IV SOLN
2.0000 g | Freq: Once | INTRAVENOUS | Status: AC
  Administered 2024-07-19: 2 g via INTRAVENOUS

## 2024-07-19 MED ORDER — METHYLPREDNISOLONE SODIUM SUCC 125 MG IJ SOLR
INTRAMUSCULAR | Status: AC
  Filled 2024-07-19: qty 2

## 2024-07-19 MED ORDER — FENTANYL CITRATE (PF) 100 MCG/2ML IJ SOLN
INTRAMUSCULAR | Status: DC | PRN
  Administered 2024-07-19: 25 ug via INTRAVENOUS
  Administered 2024-07-19: 50 ug via INTRAVENOUS

## 2024-07-19 MED ORDER — OXYCODONE HCL 5 MG PO TABS: 5.0000 mg | ORAL_TABLET | Freq: Four times a day (QID) | ORAL | 0 refills | Status: DC | PRN

## 2024-07-19 MED ORDER — OXYCODONE HCL 5 MG/5ML PO SOLN: 5.0000 mg | Freq: Once | ORAL | Status: DC | PRN

## 2024-07-19 SURGICAL SUPPLY — 33 items
BRUSH SCRUB EZ 4% CHG (MISCELLANEOUS) ×1 IMPLANT
BUR NEURO DRILL SOFT 3.0X3.8M (BURR) ×1 IMPLANT
DERMABOND ADVANCED .7 DNX12 (GAUZE/BANDAGES/DRESSINGS) ×2 IMPLANT
DRAPE C-ARM XRAY 36X54 (DRAPES) ×1 IMPLANT
DRAPE LAPAROTOMY 100X77 ABD (DRAPES) ×1 IMPLANT
DRSG OPSITE POSTOP 4X6 (GAUZE/BANDAGES/DRESSINGS) IMPLANT
DRSG OPSITE POSTOP 4X8 (GAUZE/BANDAGES/DRESSINGS) IMPLANT
ELECTRODE REM PT RTRN 9FT ADLT (ELECTROSURGICAL) ×1 IMPLANT
FEE INTRAOP CADWELL SUPPLY NCS (MISCELLANEOUS) IMPLANT
FEE INTRAOP MONITOR IMPULS NCS (MISCELLANEOUS) IMPLANT
GLOVE BIOGEL PI IND STRL 7.0 (GLOVE) ×1 IMPLANT
GLOVE BIOGEL PI IND STRL 8 (GLOVE) ×1 IMPLANT
GLOVE SRG 8 PF TXTR STRL LF DI (GLOVE) ×1 IMPLANT
GLOVE SURG SYN 7.0 PF PI (GLOVE) ×1 IMPLANT
GLOVE SURG SYN 7.5 PF PI (GLOVE) ×1 IMPLANT
GOWN SRG LRG LVL 4 IMPRV REINF (GOWNS) ×1 IMPLANT
GOWN SRG XL LVL 3 NONREINFORCE (GOWNS) ×1 IMPLANT
KIT NEURO ACCESSORY W/WRENCH (MISCELLANEOUS) IMPLANT
KIT SPINAL PRONEVIEW (KITS) ×1 IMPLANT
LAVAGE JET IRRISEPT WOUND (IRRIGATION / IRRIGATOR) ×1 IMPLANT
MANIFOLD NEPTUNE II (INSTRUMENTS) ×1 IMPLANT
NDL SAFETY ECLIPSE 18X1.5 (NEEDLE) ×1 IMPLANT
NS IRRIG 500ML POUR BTL (IV SOLUTION) ×1 IMPLANT
PACK LAMINECTOMY ARMC (PACKS) ×1 IMPLANT
PAD ARMBOARD POSITIONER FOAM (MISCELLANEOUS) ×1 IMPLANT
STAPLER SKIN PROX 35W (STAPLE) IMPLANT
SURGIFLO W/THROMBIN 8M KIT (HEMOSTASIS) ×1 IMPLANT
SUT SILK 2 0SH CR/8 30 (SUTURE) ×1 IMPLANT
SUT STRATA 3-0 15 PS-2 (SUTURE) IMPLANT
SUT VIC AB 0 CT1 18XCR BRD 8 (SUTURE) ×2 IMPLANT
SUT VIC AB 2-0 CT1 18 (SUTURE) ×2 IMPLANT
SYR 30ML LL (SYRINGE) ×2 IMPLANT
TRAP FLUID SMOKE EVACUATOR (MISCELLANEOUS) ×1 IMPLANT

## 2024-07-19 NOTE — Progress Notes (Addendum)
 Vancomycin  infusion was completed in pre-op and patient c/o of itching around her ears and redness was noted as well when patient was ready to transfer to OR for the procedure. No shortness of breath . No signs of acute distress. CRNA administered solumedrol and benadryl  intravenous in pre-op. Anesthesiologist and surgeon was made aware and acknowledged. This RN asked patient if we should list it as an allergy agent however, the patient refused to list it as of now. She stated that she will notify future provider for this event and will take necessary precautions at that time.

## 2024-07-19 NOTE — Interval H&P Note (Signed)
 History and Physical Interval Note:  07/19/2024 9:31 AM  Cassandra Brown  has presented today for surgery, with the diagnosis of T85.122A Migration of spinal cord stimulator, initial encounter T85.192A Malfunction of spinal cord stimulator, initial encounter.  The various methods of treatment have been discussed with the patient and family. After consideration of risks, benefits and other options for treatment, the patient has consented to  Procedure(s) with comments: LUMBAR SPINAL CORD STIMULATOR REMOVAL (Left) - LEFT INTERNAL PULSE GENERATOR REMOVAL as a surgical intervention.  The patient's history has been reviewed, patient examined, no change in status, stable for surgery.  I have reviewed the patient's chart and labs.  Questions were answered to the patient's satisfaction.    Heart and lungs clear   Penne LELON Sharps

## 2024-07-19 NOTE — Discharge Instructions (Signed)
 Your surgeon has performed an operation on your lumbar spine (low back) to remove your spinal cord stimulator battery. The following are instructions to help in your recovery once you have been discharged from the hospital.   Activity    No bending, lifting, or twisting ("BLT"). Avoid lifting objects heavier than 10 pounds (gallon milk jug).  Where possible, avoid household activities that involve lifting, bending, pushing, or pulling such as laundry, vacuuming, grocery shopping, and childcare. Try to arrange for help from friends and family for these activities while your back heals.  Increase physical activity slowly as tolerated.  Taking short walks is encouraged, but avoid strenuous exercise. Do not jog, run, bicycle, lift weights, or participate in any other exercises unless specifically allowed by your doctor. Avoid prolonged sitting, including car rides.  Talk to your doctor before resuming sexual activity.  You should not drive until cleared by your doctor.  Until released by your doctor, you should not return to work or school.  You should rest at home and let your body heal.   You may shower three days after your surgery.  After showering, lightly dab your incision dry. Do not take a tub bath or go swimming for 3 weeks, or until approved by your doctor at your follow-up appointment.  If you smoke, we strongly recommend that you quit.  Smoking has been proven to interfere with normal healing in your back and will dramatically reduce the success rate of your surgery. Please contact [facility] (800-QUIT-NOW) and use the resources at www.[facility].com for assistance in stopping smoking.  Surgical Incision   If you have a dressing on your incision, you may remove it three days after your surgery. Keep your incision area clean and dry.  Your incision was closed with Dermabond glue. The glue should begin to peel away within about a week.  Diet            You may return to your usual  diet. Be sure to stay hydrated.  You have been prescribed narcotic pain medications.  This often will cause constipation along with the anesthesia that you underwent.  Please obtain Colace and senna. This has been sent to your local pharmacy, but at times it is not covered by insurance and you may obtain it over the counter..  You should take a stool softener and laxative for the duration of you taking the narcotic pain medications.  When to Contact Us   Although your surgery and recovery will likely be uneventful, you may have some residual numbness, aches, and pains in your back and/or legs. This is normal and should improve in the next few weeks.  However, should you experience any of the following, contact us  immediately: New numbness or weakness Pain that is progressively getting worse, and is not relieved by your pain medications or rest Bleeding, redness, swelling, pain, or drainage from surgical incision Chills or flu-like symptoms Fever greater than 101.0 F (38.3 C) Problems with bowel or bladder functions Difficulty breathing or shortness of breath Warmth, tenderness, or swelling in your calf  Contact Information How to contact us :  If you have any questions/concerns before or after surgery, you can reach us  at [phone], or you can send a mychart message. We can be reached by phone or mychart 8am-4pm, Monday-Friday.  *Please note: Calls after 4pm are forwarded to a third party answering service. Mychart messages are not routinely monitored during evenings, weekends, and holidays. Please call our office to contact the answering service for  urgent concerns during non-business hours.

## 2024-07-19 NOTE — Transfer of Care (Signed)
 Immediate Anesthesia Transfer of Care Note  Patient: Cassandra Brown  Procedure(s) Performed: LUMBAR SPINAL CORD STIMULATOR REMOVAL (Left)  Patient Location: PACU  Anesthesia Type:General  Level of Consciousness: awake and drowsy  Airway & Oxygen Therapy: Patient Spontanous Breathing  Post-op Assessment: Report given to RN and Post -op Vital signs reviewed and stable  Post vital signs: Reviewed and stable  Last Vitals:  Vitals Value Taken Time  BP 104/65 07/19/24 10:57  Temp 36.4 C 07/19/24 10:57  Pulse 65 07/19/24 10:58  Resp 12 07/19/24 10:58  SpO2 98 % 07/19/24 10:58  Vitals shown include unfiled device data.  Last Pain:  Vitals:   07/19/24 1057  TempSrc:   PainSc: 0-No pain         Complications: No notable events documented.

## 2024-07-19 NOTE — Anesthesia Preprocedure Evaluation (Signed)
 Anesthesia Evaluation  Patient identified by MRN, date of birth, ID band Patient awake    Reviewed: Allergy & Precautions, H&P , NPO status , Patient's Chart, lab work & pertinent test results  Airway Mallampati: I  TM Distance: >3 FB Neck ROM: Full   Comment: Patient has OSA on CPAP and brought it with her today Dental no notable dental hx. (+) Upper Dentures, Lower Dentures   Pulmonary asthma , sleep apnea and Continuous Positive Airway Pressure Ventilation , pneumonia, COPD, former smoker Hx multiple pulmonary emboli 2023    Pulmonary exam normal breath sounds clear to auscultation       Cardiovascular hypertension, + angina  + CAD and +CHF  negative cardio ROS Normal cardiovascular exam+ dysrhythmias  Rhythm:Regular Rate:Normal  Left PVD Jan 2023, multiple pulmonary emboli  2D echocardiogram on 10/09/2020 revealed normal left ventricular function with LVEF 50 to 55% with normal wall motion with trivial valvular insufficiencies. 72-hour Holter monitor starting on 08/28/2020 revealed predominant sinus rhythm with a mean heart rate of 85 bpm with heart rate ranging from 66-191 bpm. There were occasional premature atrial contractions and infrequent premature ventricular contractions present. There were occasional atrial runs with the longest observed episode lasting 17 beats.   The patient underwent successful watchman left atrial appendage closure device on 03/11/2021. She is transiently on Eliquis  5 mg twice daily without increased bleeding, although she does report baseline intermittent epitaxis and hematochezia due to hemorrhoids.   Cardiomyopathy, NYHA class II, chronic diastolic CHF, PFO w/secondum ASD   Neuro/Psych  Headaches  Neuromuscular disease negative neurological ROS  negative psych ROS   GI/Hepatic negative GI ROS, Neg liver ROS,GERD  ,,  Endo/Other  negative endocrine ROS    Renal/GU      Musculoskeletal    Abdominal   Peds  Hematology negative hematology ROS (+) Blood dyscrasia, anemia   Anesthesia Other Findings CHF (congestive heart failure)  GERD (gastroesophageal reflux disease) Vasculitis (HCC)  Sleep apnea Spinal stenosis of lumbar region Wegener's disease, pulmonary Patent foramen ovale  History of kidney stones Gastroparesis Asthma-COPD overlap syndrome Chronic maxillary sinusitis Leukocytosis  Chronic pain syndrome Obesity  Coronary artery abnormality Chronic right sacroiliac joint pain Allergic rhinitis due to pollen Dysrhythmia  Unequal leg length Rheumatoid arthritis (HCC)  Angina pectoris (HCC) Collagen vascular disease (HCC)  Atrial fibrillation (HCC) Leakage of Watchman left atrial appendage closure device  History of GI bleed Hx DVT Hx multiple pulmonary emboli    Reproductive/Obstetrics negative OB ROS                              Anesthesia Physical Anesthesia Plan  ASA: 3  Anesthesia Plan: General   Post-op Pain Management:    Induction: Intravenous  PONV Risk Score and Plan: 3 and Propofol  infusion, Midazolam , Dexamethasone , Ondansetron  and TIVA  Airway Management Planned: Natural Airway and Nasal Cannula  Additional Equipment:   Intra-op Plan:   Post-operative Plan:   Informed Consent: I have reviewed the patients History and Physical, chart, labs and discussed the procedure including the risks, benefits and alternatives for the proposed anesthesia with the patient or authorized representative who has indicated his/her understanding and acceptance.     Dental Advisory Given  Plan Discussed with: Anesthesiologist, CRNA and Surgeon  Anesthesia Plan Comments: (Patient consented for risks of anesthesia including but not limited to:  - adverse reactions to medications - risk of airway placement if required - damage to eyes,  teeth, lips or other oral mucosa - nerve damage due to positioning  - sore throat or  hoarseness - Damage to heart, brain, nerves, lungs, other parts of body or loss of life  Patient voiced understanding and assent.)        Anesthesia Quick Evaluation

## 2024-07-19 NOTE — Progress Notes (Signed)
 Clarified pain medication instructions and restrictions with Dr. Claudene, patient is not to drive if taking any extra pain medications per MD (oxy 5 that was prescribed).

## 2024-07-19 NOTE — Op Note (Signed)
 Indications: the patient is a 58yo female who was diagnosed with a IPG migration with pain and failure.  This was causing her significant amount of pain and discomfort, she did not have any red flag signs for infection, however she did feel like there is a fluid collection acutely.  She did not remember any clear trauma but the pain is unbearable at this point and she can feel that every time the generator moves.   Findings: Freely mobile spinal cord stimulator IPG within a pocket.  No evidence of purulence.  Tissue and fluid sent for culture.  Preoperative Diagnosis: T85.122A Migration of spinal cord stimulator, initial encounter T85.192A Malfunction of spinal cord stimulator, initial encounter  Postoperative Diagnosis: T85.122A Migration of spinal cord stimulator, initial encounterT85.192A Malfunction of spinal cord stimulator, initial encounter     EBL: Minimal IVF: see anesthesia record Drains: none Disposition: Stable to PACU Complications: none   No foley catheter was placed.     Preoperative Note:    Risks of surgery discussed in clinic.   Operative Note:      The patient was then brought from the preoperative center with intravenous access established.  They underwent MAC sedation, were placed prone onto a padded bed.  All the pressure points were padded.  Previous incision was marked. Perioperative antibiotic prophylaxis was administered.  Sterile prep and drapes were then applied and a timeout was then observed.    The incision on the flank was then opened, we slowly bluntly dissected down to the level of the pocket, we noticed that there was a freely mobile and rotational IPG within its pocket.  It was quite mobile and able to flip freely.  We slowly dissected around this to identify the leads in order to protect them.  We did not use any cautery.  We slowly dissected over top of that and were able to open the capsule.  There was no purulence noted.  We are able to remove the  generator without any difficulty.  We inspected the leads showed no evidence of fracture on gross inspection.  We then took both swabs and tissue culture for microbiology.  We then irrigated copiously with Irrisept as well as regular irrigation.  We we obtained meticulous hemostasis.  We then closed the incision with multiple layers with glue on the top.      Patient was then rotated back to the preoperative bed lightened from sedation and taken to recovery. All counts are correct in this case.   I performed the entire procedure with the assistance of Lyle Decamp PA as an Designer, television/film set. An assistant was required for this procedure due to the complexity.  The assistant provided assistance in tissue manipulation and suction, and was required for the successful and safe performance of the procedure. I performed the critical portions of the procedure.  We explanted 1 IPG from the left lower flank.  Penne MICAEL Sharps, MD

## 2024-07-19 NOTE — Anesthesia Postprocedure Evaluation (Signed)
 Anesthesia Post Note  Patient: Cassandra Brown  Procedure(s) Performed: LUMBAR SPINAL CORD STIMULATOR REMOVAL (Left)  Patient location during evaluation: PACU Anesthesia Type: General Level of consciousness: awake and alert Pain management: pain level controlled Vital Signs Assessment: post-procedure vital signs reviewed and stable Respiratory status: spontaneous breathing, nonlabored ventilation, respiratory function stable and patient connected to nasal cannula oxygen Cardiovascular status: blood pressure returned to baseline and stable Postop Assessment: no apparent nausea or vomiting Anesthetic complications: no   No notable events documented.   Last Vitals:  Vitals:   07/19/24 1057 07/19/24 1100  BP: 104/65 (!) 99/58  Pulse: 71 67  Resp: 13 18  Temp: 36.4 C   SpO2: 99% 98%    Last Pain:  Vitals:   07/19/24 1057  TempSrc:   PainSc: 0-No pain                 Debby Mines

## 2024-07-20 ENCOUNTER — Encounter: Payer: Self-pay | Admitting: Neurosurgery

## 2024-07-24 LAB — AEROBIC/ANAEROBIC CULTURE W GRAM STAIN (SURGICAL/DEEP WOUND)
Culture: NO GROWTH
Culture: NO GROWTH
Gram Stain: NONE SEEN
Gram Stain: NONE SEEN

## 2024-07-26 ENCOUNTER — Ambulatory Visit: Payer: Self-pay | Admitting: Neurosurgery

## 2024-07-30 ENCOUNTER — Telehealth: Payer: Self-pay

## 2024-07-30 DIAGNOSIS — G894 Chronic pain syndrome: Secondary | ICD-10-CM

## 2024-07-30 NOTE — Telephone Encounter (Signed)
 Sent Dr Claudene a message to request details to schedule replacement of internal pulse generator.

## 2024-07-31 ENCOUNTER — Encounter: Admitting: Physician Assistant

## 2024-07-31 NOTE — Telephone Encounter (Signed)
-----   Message from Penne LELON Sharps sent at 07/30/2024  7:19 PM EDT ----- Regarding: RE: IPG placement 826 would be fine. I think they did MAC prone last time, maybe put choice ? ----- Message ----- From: Jos Cygan, RN Sent: 07/30/2024   6:43 PM EDT To: Penne LELON Sharps, MD Subject: IPG placement                                  Is what I wrote below ok?      Pre-op diagnosis: chronic pain syndrome   Surgical procedure: internal pulse generator implant   Laterality: Right? (Previously placed on left side)   CPT codes: 36314 (IPG placement, creating a surgical pocket and connecting electrode array to pulse generator), C1820 (rechargeable generator)   Anesthesia type: general?   Date options: 8/26 or September

## 2024-07-31 NOTE — Telephone Encounter (Signed)
 Left message to return call to discuss scheduling surgery. Can potentially do 8/26 or any Tues or Thurs in September except 9/4.

## 2024-08-01 ENCOUNTER — Ambulatory Visit (INDEPENDENT_AMBULATORY_CARE_PROVIDER_SITE_OTHER): Admitting: Physician Assistant

## 2024-08-01 VITALS — Temp 98.6°F

## 2024-08-01 DIAGNOSIS — T85123D Displacement of implanted electronic neurostimulator, generator, subsequent encounter: Secondary | ICD-10-CM

## 2024-08-01 DIAGNOSIS — T85122A Displacement of implanted electronic neurostimulator (electrode) of spinal cord, initial encounter: Secondary | ICD-10-CM

## 2024-08-01 DIAGNOSIS — Z09 Encounter for follow-up examination after completed treatment for conditions other than malignant neoplasm: Secondary | ICD-10-CM

## 2024-08-01 NOTE — Progress Notes (Signed)
   REFERRING PHYSICIAN:  Rudolpho Norleen BIRCH, Md [address] [place],  [place] [address]  DOS: 07/19/24, spinal cord stimulator battery removal   HISTORY OF PRESENT ILLNESS: Cassandra Brown is approximately 2 weeks status post spinal cord stimulator battery removal. she is doing well.  The sharp pain she was experiencing near the battery site is now gone.  No numbness, weakness, tingling.  PHYSICAL EXAMINATION:  General: Patient is well developed, well nourished, calm, collected, and in no apparent distress.   NEUROLOGICAL:  General: In no acute distress.   Awake, alert, oriented to person, place, and time.  Pupils equal round and reactive to light.  Facial tone is symmetric.     Strength:            Side Iliopsoas Quads Hamstring PF DF EHL  R 5 5 5 5 5 5   L 5 5 5 5 5 5    Incision c/d/i   ROS (Neurologic):  Negative except as noted above  IMAGING: No new imaging.    ASSESSMENT/PLAN:  Cassandra Brown is approximately 2 weeks status post spinal cord stimulator battery removal. she is doing well.  The sharp pain she was experiencing near the battery site is now gone.  No numbness, weakness, tingling.  Her examination is to baseline.  Incision is well-appearing.  Plan is for new battery placement on contralateral side with suture tack down of the battery, planned for October of this year.  Advised to contact the office if any questions or concerns arise.  Lyle Decamp PA-C Department of neurosurgery

## 2024-08-15 NOTE — Telephone Encounter (Signed)
 Message Received: 2 weeks ago Ulis Bottcher, PA-C  Keelia Graybill, RN Patient wants to aim to be scheduled for date in October.  Checking with her family regards to date, will need to be called to have it mailed down.

## 2024-08-15 NOTE — Telephone Encounter (Signed)
 Sent outside message via Epic asking Dr Tobie (Rheumatologist) for guidance regarding scheduling and her rituxan infusion and her other rheumatology medications.

## 2024-08-24 NOTE — Telephone Encounter (Signed)
 [facility] Outside Information From: Tobie, Mayur Khandu [facility])  Addressed To: Neil Brickell, RN  Context: General Communication  Subject: RE: Annia    RE: Rituxan Received: Today   Tobie, Mayur [place] ([facility])  Enslee Bibbins, RN     Since this is a battery placement, it should be fine to proceed with the procedure. Previous Messages  ----- Message ----- From: Dakiya Puopolo [facility]) Sent: 08/15/2024   2:28 PM EDT To: Mayur Loree Tobie, MD [facility]) Subject: Annia Lob Dr Tobie,  Ms Salinas is considering having her spinal cord stimulator battery placement sometime in October. Her last Rituxan infusion was reportedly 07/31/24. Would October be OK with you based on her last date of infusion? Would she need to hold/delay any of her rheumatology medications for this surgery?  Thank you for your guidance!   Take care,  -Ferrell Claiborne, RN [facility] Neurosurgery at [facility] of Dr Reeves Daisy, Dr Penne Sharps, Lyle Decamp, PA-C, [address] PA-C, [place], [place] Ph: [phone] Fax: [phone]

## 2024-08-29 ENCOUNTER — Encounter: Admitting: Neurosurgery

## 2024-09-05 NOTE — Telephone Encounter (Signed)
 I called Cassandra Brown because she had not read my mychart message. She states she is not ready to choose a surgery date at this time. She would like to get through the leaves falling season first. I encouraged her to contact me when she is ready to schedule. She has a follow up appointment with our office on 09/12/24.

## 2024-09-07 NOTE — Progress Notes (Signed)
 DIVISION OF PULMONARY AND CRITICAL CARE MEDICINE                              FOLLOW UP ENCOUNTER     Chief complaint: Pulmonary vasculitis with EGPA  History of Present Illness Cassandra Brown is a 58 year old female with rheumatoid arthritis who presents with breathing difficulties.  She experiences shortness of breath when walking and becomes winded during outdoor activities. Her breathing is currently managed with Spiriva , taken once in the morning. She previously had an allergic reaction to an injectable medication, which required a breathing treatment and discontinuation of the medication.  She is on Emgality for headaches without side effects and a maintenance dose of 5 mg prednisone . She receives rituximab every four months. She takes two beta blockers: propranolol  for migraines and sotalol  for atrial fibrillation. Due to a prescription issue, she now takes half a dose of sotalol . Despite taking iron supplements for a previous severe epistaxis and using a sleep apnea machine, she feels very tired and lacks energy.  She has a IT trainer implanted. She wants to reduce her medication burden, particularly propranolol .   Past Medical History:   Past Medical History:  Diagnosis Date  . A-fib (CMS/HHS-HCC)   . Abnormal ANCA (antineutrophil cytoplasmic antibody)   . Allergic rhinitis due to pollen   . Allergy   . Arrhythmia   . Asthma, unspecified asthma severity, unspecified whether complicated, unspecified whether persistent (HHS-HCC)   . Calculus of kidney   . Chronic maxillary sinusitis   . Chronic pain syndrome   . Congestive heart failure (CHF) (CMS/HHS-HCC)   . Coronary artery abnormality (HHS-HCC)   . Gastroparesis   . GERD (gastroesophageal reflux disease)   . History of tobacco use   . Hypertension   . Leg length difference, acquired   . Leukocytoclastic vasculitis (CMS/HHS-HCC)   . Leukocytosis   . Morbid obesity (CMS/HHS-HCC)    . Osteoarthritis   . Osteoporosis   . Patent foramen ovale (HHS-HCC)   . Rheumatoid arthritis (CMS/HHS-HCC)   . Sleep apnea, obstructive   . Spinal stenosis of lumbar region without neurogenic claudication   . Spondylosis of lumbar region without myelopathy or radiculopathy   . Spondylosis without myelopathy   . Wegener's disease, pulmonary (CMS/HHS-HCC)     Past Surgical History:   Past Surgical History:  Procedure Laterality Date  . Right total knee arthroplasty  04/06/2011   Dr Elsie Brine, [facility], [place], [place]  . Right knee revision - polyethylene exchange  2013  . spinal cord stimulator trial  05/21/2020   [employer] - Dr Marcelino  . Thoracic spinal cord stimulator leads, percutaneous, left flank pulse generator  06/23/2020   Dr Elspeth Ahle, [employer]  . OTHER SURGERY  05/13/2021  . EGD  04/30/2022   Gastritis/Esophagitis/No repeat indicated/SMR  . COLONOSCOPY  04/30/2022   Tubular adenomas/PHx CP/Repeat 6 months to eval cecal site/SMR  . Colon @ [facility]  01/14/2023   Tubular adenoma/PHx CP/Repeat 20yrs/SMR  . APPENDECTOMY    . ARTHROSCOPIC ROTATOR CUFF REPAIR    . CHOLECYSTECTOMY    . cmc arthroplasty     01-20-2012 and 09-08-2012  . ELBOW ARTHROSCOPY     removal loose fb  . IMPLANTATION NEUROSTIMULATOR ELECTRODES FOR  PERIPHERAL NERVE    . JOINT REPLACEMENT    . KNEE ARTHROSCOPY     meniscetomy  . mouth surgery    . OTHER SURGERY     inj Dx/ther agnt paravert facet joint, img guide, lumbar/sac, add level    Allergies:   Allergies  Allergen Reactions  . Mepolizumab Shortness Of Breath  . Cyclobenzaprine Hives  . Ibuprofen Hives  . Meperidine Itching and Swelling  . Morphine Itching and Swelling  . Sulfa (Sulfonamide Antibiotics) Itching and Swelling  . Onion Itching    Can use onion powder    Current Medications:   Prior to Admission medications  Medication Sig Taking? Last Dose  bisacodyL (DULCOLAX) 5 mg EC tablet Take 5 mg by mouth  Yes Taking  butalbital-acetaminophen -caffeine (FIORICET) 50-325-40 mg tablet Take 1 tablet by mouth every 4 (four) hours as needed for Pain Yes Taking  carisoprodoL  (SOMA ) 350 MG tablet Take 1 tablet (350 mg total) by mouth 3 (three) times daily as needed for Muscle spasms Yes Taking  cholecalciferol  (VITAMIN D3) 1000 unit capsule Take 1,000 Units by mouth once daily Yes Taking  EPINEPHrine  (EPIPEN ) 0.3 mg/0.3 mL auto-injector  Yes Taking  ferrous sulfate 325 (65 FE) MG tablet Take 325 mg by mouth daily with breakfast Yes Taking  folic acid  (FOLVITE ) 1 MG tablet Take 1 tablet (1,000 mcg total) by mouth once daily Yes Taking  FUROsemide  (LASIX ) 20 MG tablet TAKE 2 TABLETS BY MOUTH DAILY Yes Taking  galcanezumab-gnlm (EMGALITY PEN) 120 mg/mL PnIj 120 mg by subcutaneous (via wearable injector) route monthly Yes Taking  gentamicin  (GARAMYCIN ) 0.1 % ointment Apply topically 3 (three) times daily Yes Taking  HYDROcodone -acetaminophen  (NORCO) 10-325 mg tablet Take 1 tablet by mouth every 4 (four) hours as needed Yes Taking  ipratropium-albuteroL  (DUO-NEB) nebulizer solution Inhale 3 mLs into the lungs Yes Taking  methotrexate  (RHEUMATREX) 2.5 MG tablet Take 10 tablets (25 mg total) by mouth every 7 (seven) days All on the same day Yes Taking  montelukast  (SINGULAIR ) 10 mg tablet TAKE 1 TABLET BY MOUTH EVERY EVENING Yes Taking  MULTIVITAMIN ORAL Take 1 tablet by mouth once daily Yes Taking  NURTEC ODT  75 mg disintegrating tablet Take 1 tablet (75 mg total) by mouth as directed May repeat dose in 2 hours if needed Yes Taking  pantoprazole  (PROTONIX ) 40 MG DR tablet Take 1 tablet (40 mg total) by mouth 2 (two) times daily before meals Yes Taking  pentoxifylline  (TRENTAL ) 400 mg CR tablet Take 1 tablet (400 mg total) by mouth 2 (two) times daily Yes Taking  predniSONE  (DELTASONE ) 5 MG tablet TAKE 1 TABLET BY MOUTH DAILY Yes Taking  pregabalin  (LYRICA ) 75 MG capsule TAKE 1 CAPSULE BY MOUTH 3 TIMES DAILY Yes  Taking  promethazine  (PHENERGAN ) 25 MG tablet Take 1 tablet (25 mg total) by mouth every 6 (six) hours as needed for Nausea Yes Taking  propranoloL  (INDERAL  LA) 80 MG 24 hr capsule Take 1 capsule (80 mg total) by mouth at bedtime Yes Taking  riTUXimab in sodium chloride  infusion chemo infusion Inject 1 Dose into the vein every 4 (four) months Yes Taking  rosuvastatin  (CRESTOR ) 10 MG tablet Take 1 tablet (10 mg total) by mouth once daily Yes Taking  sotaloL  (BETAPACE ) 80 MG tablet Take 0.5 tablets (40 mg total) by mouth 2 (two) times daily Yes Taking  sucralfate  (CARAFATE ) 1 gram tablet TAKE 1 TABLET BY MOUTH 2 TIMES DAILY BEFORE MEALS Yes Taking  tobramycin-lotepred 0.3-0.5 % DrpS ADD (  SQUEEZE 0.5ML TWICE=1ML DOSE) OF MEDICATION TO 250ML OF SALINE IN SALINE IRRIGATION BOTTLE; IRRIGATE SINUSES WITH THROUGH EACH NOSTRIL TWICE DAILY Yes Taking  albuterol  MDI, PROVENTIL , VENTOLIN , PROAIR , HFA 90 mcg/actuation inhaler Inhale 2 inhalations into the lungs every 6 (six) hours as needed for Wheezing for up to 90 days    dupilumab  (DUPIXENT  PEN) 300 mg/2 mL pen injector Inject 2 mLs (300 mg total) subcutaneously every 14 (fourteen) days Patient not taking: Reported on 08/30/2024  Not Taking  mepolizumab (NUCALA) 100 mg/mL auto-injection Inject 3 mLs (300 mg total) subcutaneously every 28 (twenty-eight) days Allow to sit at room temperature for 30 minutes prior to administration. Administer via SUBQ injection into the upper arm, thigh, or abdomen (avoid 5 cm [~2 inches] area surrounding the navel); avoid skin that is tender, bruised, red, or hard. ** Do not freeze; protect from light and heat. Do not shake. ** Patient not taking: Reported on 08/30/2024  Not Taking    Family History:   Family History  Adopted: Yes  Problem Relation Name Age of Onset  . High blood pressure (Hypertension) Mother Edna   . Diabetes type II Mother Joann   . Osteoporosis (Thinning of bones) Mother Edna   . Heart failure  Mother Edna   . Breast cancer Mother Edna   . Allergic rhinitis Mother Edna   . Asthma Mother Edna   . Coronary Artery Disease (Blocked arteries around heart) Mother Edna   . Glaucoma Mother Edna   . Hyperlipidemia (Elevated cholesterol) Mother Edna   . Obesity Mother Edna   . Osteoarthritis Mother Edna     Social History:   Social History   Socioeconomic History  . Marital status: Single  . Number of children: 0  . Years of education: 13+  Occupational History  . 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 Smoking status: Former    Current packs/day: 0.00    Average packs/day: 3.0 packs/day for 34.0 years (102.0 ttl pk-yrs)    Types: Cigarettes    Start date: 11/05/1977    Quit date: 11/06/2011    Years since quitting: 12.8    Passive exposure: Past  . Smokeless tobacco: Never  . Tobacco comments:    quit 2012  Vaping Use  . Vaping status: Every Day  Substance and Sexual Activity  . Alcohol use: Not Currently  . Drug use: Not Currently  . Sexual activity: Not Currently   Social Drivers of Health   Financial Resource Strain: Low Risk  (08/07/2024)   Overall Financial Resource Strain (CARDIA)   . Difficulty of Paying Living Expenses: Not hard at all  Food Insecurity: No Food Insecurity (08/07/2024)   Hunger Vital Sign   . Worried About Programme researcher, broadcasting/film/video in the Last Year: Never true   . Ran Out of Food in the Last Year: Never true  Transportation Needs: No Transportation Needs (08/07/2024)   PRAPARE - Transportation   . Lack of Transportation (Medical): No   . Lack of Transportation (Non-Medical): No  Housing Stability: Low Risk  (08/07/2024)   Housing Stability Vital Sign   . Unable to Pay for Housing in the Last Year: No   . Number of Times Moved in the Last Year: 0   . Homeless in the Last Year: No    Review of Systems:   A 10 point review of systems is negative, except for the pertinent positives and negatives detailed  in  the HPI.  Vitals:   Vitals:   08/30/24 1503  BP: 113/71  BP Location: Right upper arm  Patient Position: Sitting  BP Cuff Size: Large Adult  Pulse: 53  SpO2: 95%  Weight: 86.2 kg (190 lb)  Height: 157.5 cm (5' 2)     Body mass index is 34.75 kg/m.  Physical Exam:   Physical Exam Vitals and nursing note reviewed.  Constitutional:      General: in no acute distress.    Appearance: Normal appearance. Is not ill-appearing, toxic-appearing or diaphoretic.  HENT:     Head: Normocephalic and atraumatic.     Right Ear: External ear normal.     Left Ear: External ear normal.  Eyes:     General:        Right eye: No discharge.        Left eye: No discharge.     Extraocular Movements: Extraocular movements intact.     Pupils: Pupils are equal, round, and reactive to light.  Cardiovascular:     Rate and Rhythm: Normal rate and regular rhythm.     Pulses: Normal pulses.     Heart sounds: Normal heart sounds. No murmur heard.    No friction rub. No gallop.  Abdominal:     General: Bowel sounds are normal.  Skin:    General: Skin is warm and dry.     Capillary Refill: Capillary refill takes less than 2 seconds.  Neurological:     Mental Status: Patient is alert.     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 Spiriva  is unnecessary with current rituximab therapy. - Discontinue Spiriva . - Prescribe Ventolin  (albuterol ) inhaler as needed  for shortness of breath.  Atrial fibrillation on sotalol  therapy Managed with sotalol , effective in controlling atrial fibrillation. On a reduced dose due to previous adjustments and also on propranolol  for migraines. - Continue sotalol  therapy as prescribed by cardiology.  Migraine on Emgality and propranolol  therapy Managed with Emgality and propranolol . No side effects from [employer] Propranolol  may contribute to fatigue. - Continue Emgality and propranolol  therapy. - Discuss with neurologist the possibility of discontinuing propranolol .  Chronic fatigue Significant issue, potentially exacerbated by propranolol  and sotalol . Reports low energy levels despite diet and iron supplementation. - Discuss with cardiologist and neurologist about potential adjustments to beta blocker therapy to address fatigue.  Obstructive sleep apnea Managed with a sleep apnea machine, but fatigue persists.  Iron deficiency, status post prior blood loss Secondary to significant blood loss from previous epistaxis. Continues iron supplementation to maintain adequate levels and improve energy. - Continue iron supplementation.     I spent a total of 41 minutes in both face-to-face and non-face-to-face activities, excluding procedures performed, for this visit on the date of this encounter.   This note has been created using dictation software tool and any typographical errors are purely unintentional.  Patient received an After Visit Summary

## 2024-09-12 ENCOUNTER — Ambulatory Visit (INDEPENDENT_AMBULATORY_CARE_PROVIDER_SITE_OTHER): Admitting: Neurosurgery

## 2024-09-12 ENCOUNTER — Encounter: Payer: Self-pay | Admitting: Neurosurgery

## 2024-09-12 VITALS — BP 124/72 | Temp 98.5°F | Ht 62.0 in | Wt 191.0 lb

## 2024-09-12 DIAGNOSIS — Z09 Encounter for follow-up examination after completed treatment for conditions other than malignant neoplasm: Secondary | ICD-10-CM | POA: Diagnosis not present

## 2024-09-12 DIAGNOSIS — T85123D Displacement of implanted electronic neurostimulator, generator, subsequent encounter: Secondary | ICD-10-CM

## 2024-09-12 DIAGNOSIS — T85192A Other mechanical complication of implanted electronic neurostimulator (electrode) of spinal cord, initial encounter: Secondary | ICD-10-CM

## 2024-09-12 NOTE — Progress Notes (Signed)
   REFERRING PHYSICIAN:  Rudolpho Norleen BIRCH, Md [address] [place],  [place] [address]  DOS: 07/19/24, spinal cord stimulator battery removal   HISTORY OF PRESENT ILLNESS: Cassandra Brown is status post Mediport stimulator battery removal.  She is here today to discuss replacing the stimulator and moving into the opposite side.  PHYSICAL EXAMINATION:  General: Patient is well developed, well nourished, calm, collected, and in no apparent distress.   NEUROLOGICAL:  General: In no acute distress.   Awake, alert, oriented to person, place, and time.  Pupils equal round and reactive to light.  Facial tone is symmetric.     Strength:            Side Iliopsoas Quads Hamstring PF DF EHL  R 5 5 5 5 5 5   L 5 5 5 5 5 5    Incision c/d/i   ROS (Neurologic):  Negative except as noted above  IMAGING: No new imaging.    ASSESSMENT/PLAN:  Cassandra Brown is approximately 1.5 months out from battery removal.  She is here today to discuss possible replacement.  We have discussed moving the battery to the opposite side from where the previous site was.  This will involve at least 2 incisions 1 to place lead extenders on the lead side and another side for the pocket placement.  She will reach out to us  once she has a date in mind and then will go forward with booking.    Penne MICAEL Sharps, MD Department of neurosurgery

## 2024-09-26 NOTE — Telephone Encounter (Signed)
 Cassandra Brown

## 2024-10-18 ENCOUNTER — Other Ambulatory Visit: Payer: Self-pay

## 2024-10-18 ENCOUNTER — Telehealth: Payer: Self-pay | Admitting: Neurosurgery

## 2024-10-18 ENCOUNTER — Telehealth: Payer: Self-pay

## 2024-10-18 DIAGNOSIS — T85192A Other mechanical complication of implanted electronic neurostimulator (electrode) of spinal cord, initial encounter: Secondary | ICD-10-CM

## 2024-10-18 DIAGNOSIS — G894 Chronic pain syndrome: Secondary | ICD-10-CM

## 2024-10-18 DIAGNOSIS — Z01818 Encounter for other preprocedural examination: Secondary | ICD-10-CM

## 2024-10-18 NOTE — Telephone Encounter (Signed)
 Went over surgical instructions with patient as listed below.  Please see below for information in regards to your upcoming surgery:   Planned surgery: Internal Pulse Generator (IPG) Placement   Surgery date: 12/11/2024 at [facility] (Medical Mall: [address], [place], [place] [address]) - you will find out your arrival time the business day before your surgery.   Pre-op appointment at [facility] Pre-admit Testing: you will receive a call with a date/time for this appointment. If you are scheduled for an in person appointment, Pre-admit Testing is located on the first floor of the [facility] building, [address] [facility] [address], [address]. During this appointment, they will advise you which medications you can take the morning of surgery, and which medications you will need to hold for surgery. Labs (such as blood work, EKG) may be done at your pre-op appointment. You are not required to fast for these labs. Should you need to change your pre-op appointment, please call Pre-admit testing at [phone].      Surgical clearance: we will send a clearance form to Norleen Rower, MD (PCP) & Therisa Pierre, PA (Cardiology). They may wish to see you in their office prior to signing the clearance form. If so, they may call you to schedule an appointment.      Common restrictions after spine surgery: No bending, lifting, or twisting ("BLT"). Avoid lifting objects heavier than 10 pounds for the first 6 weeks after surgery. Where possible, avoid household activities that involve lifting, bending, reaching, pushing, or pulling such as laundry, vacuuming, grocery shopping, and childcare. Try to arrange for help from friends and family for these activities while you heal. Do not drive while taking prescription pain medication. Weeks 6 through 12 after surgery: avoid lifting more than 25 pounds.      How to contact us :  If you have any questions/concerns before or after  surgery, you can reach us  at [phone], or you can send a mychart message. We can be reached by phone or mychart 8am-4pm, Monday-Friday.  *Please note: Calls after 4pm are forwarded to a third party answering service. Mychart messages are not routinely monitored during evenings, weekends, and holidays. Please call our office to contact the answering service for urgent concerns during non-business hours.     If you have FMLA/disability paperwork, please drop it off or fax it to [phone]   Appointments/FMLA & disability paperwork: Reche Hait, & Nichole Registered Nurse/Surgery scheduler: Kendelyn, RN & Katie, RN Certified Medical Assistants: Don, CMA, Elenor, CMA, Damien, CMA, & Auston, [place] Physician Assistants: Lyle Decamp, PA-C, Edsel Goods, PA-C & Glade Boys, PA-C Surgeons: Penne Sharps, MD & Reeves Daisy, MD     [facility] PREADMIT TESTING VISIT and SURGERY INFORMATION SHEET   Now that surgery has been scheduled you can anticipate several phone calls from [facility] services. A pharmacy technician will call you to verify your current list of medications taken at home.               The Pre-Service Center will call to verify your insurance information and to give you billing estimates and information.             The Preadmit Testing Office will be calling to schedule a visit to obtain information for the anesthesia team and provide instructions on preparation for surgery.  What can you expect for the Preadmit Testing Visit: Appointments may be scheduled in-person or by telephone.  If a telephone visit is  scheduled, you may be asked to come into the office to have lab tests or other studies performed.   This visit will not be completed any greater than 14 days prior to your surgery.  If your surgery has been scheduled for a future date, please do not be alarmed if we have not contacted you to schedule an appointment more than a month  prior to the surgery date.    Please be prepared to provide the following information during this appointment:            -Personal medical history                                               -Medication and allergy list            -Any history of problems with anesthesia              -Recent lab work or diagnostic studies            -Please notify us  of any needs we should be aware of to provide the best care possible           -You will be provided with instructions on how to prepare for your surgery.    On The Day of Surgery:  You must have a driver to take you home after surgery, you will be asked not to drive for 24 hours following surgery.  Taxi, Gisele and non-medical transport will not be acceptable means of transportation unless you have a responsible individual who will be traveling with you.  Visitors in the surgical area:   2 people will be able to visit you in your room once your preparation for surgery has been completed. During surgery, your visitors will be asked to wait in the Surgery Waiting Area.  It is not a requirement for them to stay, if they prefer to leave and come back.  Your visitor(s) will be given an update once the surgery has been completed.  No visitors are allowed in the initial recovery room to respect patient privacy and safety.  Once you are more awake and transfer to the secondary recovery area, or are transferred to an inpatient room, visitors will again be able to see you.  To respect and protect your privacy: We will ask on the day of surgery who your driver will be and what the contact number for that individual will be. We will ask if it is okay to share information with this individual, or if there is an alternative individual that we, or the surgeon, should contact to provide updates and information. If family or friends come to the surgical information desk requesting information about you, who you have not listed with us , no information will be  given.   It may be helpful to designate someone as the main contact who will be responsible for updating your other friends and family.    PREADMIT TESTING OFFICE: [phone] SAME DAY SURGERY: [phone] We look forward to caring for you before and throughout the process of your surgery.

## 2024-10-18 NOTE — Telephone Encounter (Signed)
 Patient is calling to let our office know that she is ready to schedule surgery to replace the battery in her stimulator.

## 2024-10-18 NOTE — Telephone Encounter (Signed)
 Called to schedule surgery and discuss surgery instructions. See new encounter

## 2024-12-04 ENCOUNTER — Other Ambulatory Visit: Payer: Self-pay

## 2024-12-04 ENCOUNTER — Inpatient Hospital Stay
Admission: RE | Admit: 2024-12-04 | Discharge: 2024-12-04 | Disposition: A | Source: Ambulatory Visit | Attending: Neurosurgery | Admitting: Neurosurgery

## 2024-12-04 DIAGNOSIS — I5032 Chronic diastolic (congestive) heart failure: Secondary | ICD-10-CM

## 2024-12-04 DIAGNOSIS — E876 Hypokalemia: Secondary | ICD-10-CM

## 2024-12-04 DIAGNOSIS — R002 Palpitations: Secondary | ICD-10-CM

## 2024-12-04 DIAGNOSIS — G894 Chronic pain syndrome: Secondary | ICD-10-CM

## 2024-12-04 DIAGNOSIS — Z01812 Encounter for preprocedural laboratory examination: Secondary | ICD-10-CM

## 2024-12-04 DIAGNOSIS — I482 Chronic atrial fibrillation, unspecified: Secondary | ICD-10-CM

## 2024-12-04 HISTORY — DX: Polyarteritis with lung involvement (churg-strauss): M30.1

## 2024-12-04 HISTORY — DX: Spinal stenosis, lumbar region without neurogenic claudication: M48.061

## 2024-12-04 HISTORY — DX: Anemia, unspecified: D64.9

## 2024-12-04 NOTE — Patient Instructions (Addendum)
 Your procedure is scheduled on: 12/11/24 - Tuesday Report to the Registration Desk on the 1st floor of the [facility]. To find out your arrival time, please call [phone] between 1PM - 3PM on: 12/10/24 - Monday If your arrival time is 6:00 am, do not arrive before that time as the [facility] entrance doors do not open until 6:00 am. 12/10/24 - Monday  REMEMBER: Instructions that are not followed completely may result in serious medical risk, up to and including death; or upon the discretion of your surgeon and anesthesiologist your surgery may need to be rescheduled.  Do not eat food after midnight the night before surgery.  No gum chewing or hard candies.  You may however, drink CLEAR liquids up to 2 hours before you are scheduled to arrive for your surgery. Do not drink anything within 2 hours of your scheduled arrival time.  Clear liquids include: - water   - apple juice without pulp - gatorade (not RED colors) - black coffee or tea (Do NOT add milk or creamers to the coffee or tea) Do NOT drink anything that is not on this list.  One week prior to surgery: Stop Anti-inflammatories (NSAIDS) such as Advil, Aleve, Ibuprofen, Motrin, Naproxen, Naprosyn and Aspirin  based products such as Excedrin, Goody's Powder, BC Powder.  You may take Tylenol  and/or HYDROcodone -acetaminophen  if needed for pain up until the day of surgery.  Stop ANY OVER THE COUNTER supplements until after surgery.  HOLD pentoxifylline  (TRENTAL )  on the day of surgery.  ON THE DAY OF SURGERY ONLY TAKE THESE MEDICATIONS WITH SIPS OF WATER :  budesonide (PULMICORT)  carisoprodol  (SOMA )  HYDROcodone -acetaminophen  (NORCO) pantoprazole  (PROTONIX )   predniSONE  (DELTASONE )  pregabalin  (LYRICA )  sotalol  (BETAPACE )   Use inhalers albuterol  on the day of surgery and bring to the hospital.  No Alcohol for 24 hours before or after surgery.  No Smoking including e-cigarettes for 24 hours before surgery.  No  chewable tobacco products for at least 6 hours before surgery.  No nicotine patches on the day of surgery.  Do not use any recreational drugs for at least a week (preferably 2 weeks) before your surgery.  Please be advised that the combination of cocaine and anesthesia may have negative outcomes, up to and including death. If you test positive for cocaine, your surgery will be cancelled.  On the morning of surgery brush your teeth with toothpaste and water , you may rinse your mouth with mouthwash if you wish. Do not swallow any toothpaste or mouthwash.  Use CHG Soap or wipes as directed on instruction sheet.  Do not wear jewelry, make-up, hairpins, clips or nail polish.  For welded (permanent) jewelry: bracelets, anklets, waist bands, etc.  Please have this removed prior to surgery.  If it is not removed, there is a chance that hospital personnel will need to cut it off on the day of surgery.  Do not wear lotions, powders, or perfumes.   Do not shave body hair from the neck down 48 hours before surgery.  Contact lenses, hearing aids and dentures may not be worn into surgery.  Do not bring valuables to the hospital. [facility] is not responsible for any missing/lost belongings or valuables.   Bring your C-PAP to the hospital in case you may have to spend the night.   Notify your doctor if there is any change in your medical condition (cold, fever, infection).  Wear comfortable clothing (specific to your surgery type) to the hospital.  After surgery, you  can help prevent lung complications by doing breathing exercises.  Take deep breaths and cough every 1-2 hours. Your doctor may order a device called an Incentive Spirometer to help you take deep breaths.  If you are being admitted to the hospital overnight, leave your suitcase in the car. After surgery it may be brought to your room.  In case of increased patient census, it may be necessary for you, the patient, to continue your  postoperative care in the Same Day Surgery department.  If you are being discharged the day of surgery, you will not be allowed to drive home. You will need a responsible individual to drive you home and stay with you for 24 hours after surgery.   If you are taking public transportation, you will need to have a responsible individual with you.  Please call the Pre-admissions Testing Dept. at [phone] if you have any questions about these instructions.  Surgery Visitation Policy:  Patients having surgery or a procedure may have two visitors.  Children under the age of 14 must have an adult with them who is not the patient.  Inpatient Visitation:    Visiting hours are 7 a.m. to 8 p.m. Up to four visitors are allowed at one time in a patient room. The visitors may rotate out with other people during the day.  One visitor age 43 or older may stay with the patient overnight and must be in the room by 8 p.m.   Merchandiser, Retail to address health-related social needs:  [email or web address]                                                                                                             Preparing for Surgery with CHLORHEXIDINE  GLUCONATE (CHG) Soap  Chlorhexidine  Gluconate (CHG) Soap  o An antiseptic cleaner that kills germs and bonds with the skin to continue killing germs even after washing  o Used for showering the night before surgery and morning of surgery  Before surgery, you can play an important role by reducing the number of germs on your skin.  CHG (Chlorhexidine  gluconate) soap is an antiseptic cleanser which kills germs and bonds with the skin to continue killing germs even after washing.  Please do not use if you have an allergy to CHG or antibacterial soaps. If your skin becomes reddened/irritated stop using the CHG.  1. Shower the NIGHT BEFORE SURGERY with CHG soap.  2. If you choose to wash your hair, wash your hair first as usual  with your normal shampoo.  3. After shampooing, rinse your hair and body thoroughly to remove the shampoo.  4. Use CHG as you would any other liquid soap. You can apply CHG directly to the skin and wash gently with a clean washcloth.  5. Apply the CHG soap to your body only from the neck down. Do not use on open wounds or open sores. Avoid contact with your eyes, ears, mouth, and genitals (private parts). Wash face and genitals (private parts) with your normal soap.  6.  Wash thoroughly, paying special attention to the area where your surgery will be performed.  7. Thoroughly rinse your body with warm water .  8. Do not shower/wash with your normal soap after using and rinsing off the CHG soap.  9. Do not use lotions, oils, etc., after showering with CHG.  10. Pat yourself dry with a clean towel.  11. Wear clean pajamas to bed the night before surgery.  12. Place clean sheets on your bed the night of your shower and do not sleep with pets.  13. Do not apply any deodorants/lotions/powders.  14. Please wear clean clothes to the hospital.  15. Remember to brush your teeth with your regular toothpaste.

## 2024-12-05 ENCOUNTER — Encounter: Payer: Self-pay | Admitting: Urgent Care

## 2024-12-05 NOTE — Patient Instructions (Signed)
 Your procedure is scheduled on: 12/11/24 - Tuesday Report to the Registration Desk on the 1st floor of [facility]. To find out your arrival time, please call [phone] between 1PM - 3PM on: 12/10/24 - Monday If your arrival time is 6:00 am, do not arrive before that time as the [facility] entrance doors do not open until 6:00 am. 12/10/24 - Monday   REMEMBER: Instructions that are not followed completely may result in serious medical risk, up to and including death; or upon the discretion of your surgeon and anesthesiologist your surgery may need to be rescheduled.   Do not eat food after midnight the night before surgery.  No gum chewing or hard candies.   You may however, drink CLEAR liquids up to 2 hours before you are scheduled to arrive for your surgery. Do not drink anything within 2 hours of your scheduled arrival time.   Clear liquids include: - water   - apple juice without pulp - gatorade (not RED colors) - black coffee or tea (Do NOT add milk or creamers to the coffee or tea) Do NOT drink anything that is not on this list.   One week prior to surgery: Stop Anti-inflammatories (NSAIDS) such as Advil, Aleve, Ibuprofen, Motrin, Naproxen, Naprosyn and Aspirin  based products such as Excedrin, Goody's Powder, BC Powder.   You may take Tylenol  and/or HYDROcodone -acetaminophen  if needed for pain up until the day of surgery.   Stop ANY OVER THE COUNTER supplements until after surgery.   HOLD pentoxifylline  (TRENTAL )  hold beginning 12/11, resume taking 2 days after surgery.   ON THE DAY OF SURGERY ONLY TAKE THESE MEDICATIONS WITH SIPS OF WATER :   budesonide (PULMICORT)  carisoprodol  (SOMA )  HYDROcodone -acetaminophen  (NORCO) pantoprazole  (PROTONIX )   predniSONE  (DELTASONE )  pregabalin  (LYRICA )  sotalol  (BETAPACE )    Use inhalers albuterol  on the day of surgery and bring to the hospital.   No Alcohol for 24 hours before or after surgery.   No Smoking including  e-cigarettes for 24 hours before surgery.  No chewable tobacco products for at least 6 hours before surgery.  No nicotine patches on the day of surgery.   Do not use any recreational drugs for at least a week (preferably 2 weeks) before your surgery.  Please be advised that the combination of cocaine and anesthesia may have negative outcomes, up to and including death. If you test positive for cocaine, your surgery will be cancelled.   On the morning of surgery brush your teeth with toothpaste and water , you may rinse your mouth with mouthwash if you wish. Do not swallow any toothpaste or mouthwash.   Use CHG Soap or wipes as directed on instruction sheet.   Do not wear jewelry, make-up, hairpins, clips or nail polish.   For welded (permanent) jewelry: bracelets, anklets, waist bands, etc.  Please have this removed prior to surgery.  If it is not removed, there is a chance that hospital personnel will need to cut it off on the day of surgery.   Do not wear lotions, powders, or perfumes.    Do not shave body hair from the neck down 48 hours before surgery.   Contact lenses, hearing aids and dentures may not be worn into surgery.   Do not bring valuables to the hospital. [facility] is not responsible for any missing/lost belongings or valuables.    Bring your C-PAP to the hospital in case you may have to spend the night.    Notify your doctor  if there is any change in your medical condition (cold, fever, infection).   Wear comfortable clothing (specific to your surgery type) to the hospital.   After surgery, you can help prevent lung complications by doing breathing exercises.  Take deep breaths and cough every 1-2 hours. Your doctor may order a device called an Incentive Spirometer to help you take deep breaths.   If you are being admitted to the hospital overnight, leave your suitcase in the car. After surgery it may be brought to your room.   In case of increased patient  census, it may be necessary for you, the patient, to continue your postoperative care in the Same Day Surgery department.   If you are being discharged the day of surgery, you will not be allowed to drive home. You will need a responsible individual to drive you home and stay with you for 24 hours after surgery.    If you are taking public transportation, you will need to have a responsible individual with you.   Please call the Pre-admissions Testing Dept. at [phone] if you have any questions about these instructions.   Surgery Visitation Policy:   Patients having surgery or a procedure may have two visitors.  Children under the age of 25 must have an adult with them who is not the patient.   Inpatient Visitation:     Visiting hours are 7 a.m. to 8 p.m. Up to four visitors are allowed at one time in a patient room. The visitors may rotate out with other people during the day.  One visitor age 20 or older may stay with the patient overnight and must be in the room by 8 p.m.     Merchandiser, Retail to address health-related social needs:  [email or web address]                                                                                                              Preparing for Surgery with CHLORHEXIDINE  GLUCONATE (CHG) Soap   Chlorhexidine  Gluconate (CHG) Soap   o An antiseptic cleaner that kills germs and bonds with the skin to continue killing germs even after washing   o Used for showering the night before surgery and morning of surgery   Before surgery, you can play an important role by reducing the number of germs on your skin.  CHG (Chlorhexidine  gluconate) soap is an antiseptic cleanser which kills germs and bonds with the skin to continue killing germs even after washing.   Please do not use if you have an allergy to CHG or antibacterial soaps. If your skin becomes reddened/irritated stop using the CHG.   1. Shower the NIGHT BEFORE SURGERY  with CHG soap.   2. If you choose to wash your hair, wash your hair first as usual with your normal shampoo.   3. After shampooing, rinse your hair and body thoroughly to remove the shampoo.   4. Use CHG as you would any other liquid soap. You can apply CHG directly to the skin and wash  gently with a clean washcloth.   5. Apply the CHG soap to your body only from the neck down. Do not use on open wounds or open sores. Avoid contact with your eyes, ears, mouth, and genitals (private parts). Wash face and genitals (private parts) with your normal soap.   6. Wash thoroughly, paying special attention to the area where your surgery will be performed.   7. Thoroughly rinse your body with warm water .   8. Do not shower/wash with your normal soap after using and rinsing off the CHG soap.   9. Do not use lotions, oils, etc., after showering with CHG.   10. Pat yourself dry with a clean towel.   11. Wear clean pajamas to bed the night before surgery.   12. Place clean sheets on your bed the night of your shower and do not sleep with pets.   13. Do not apply any deodorants/lotions/powders.   14. Please wear clean clothes to the hospital.   15. Remember to brush your teeth with your regular toothpaste.     This encounter is not signed. The conversation may still be ongoing.

## 2024-12-05 NOTE — Progress Notes (Signed)
 Perioperative / Anesthesia Services  Pre-Admission Testing Clinical Review / Pre-Operative Anesthesia Consult  Date: 12/05/24  PATIENT DEMOGRAPHICS: Name: Cassandra Brown DOB: [birth year] MRN:   [id number]  Note: Available PAT nursing documentation and vital signs have been reviewed. Clinical nursing staff has updated patient's PMH/PSHx, current medication list, and drug allergies/intolerances to ensure complete and comprehensive history available to assist care teams in MDM as it pertains to the aforementioned surgical procedure and anticipated anesthetic course. Extensive review of available clinical information personally performed. [place] PMH and PSHx updated with any diagnoses/procedures that  may have been inadvertently omitted during his intake with the pre-admission testing department's nursing staff.  PLANNED SURGICAL PROCEDURE(S):   Case: [id number] Date/Time: 12/11/24 0755   Procedure: INSERTION, PULSE GENERATOR, NEUROSTIMULATOR, SPINAL (Right) - Right sided internal pulse generator (IPG) placement   Anesthesia type: Choice   Diagnosis:      Malfunction of spinal cord stimulator, initial encounter [T85.192A]     Chronic pain syndrome [G89.4]   Pre-op diagnosis:      T85.192A     G89.4   Location: [facility] OR ROOM 03 / [facility]   Surgeons: Claudene Penne ORN, MD        CLINICAL DISCUSSION: Cassandra Brown is a 58 y.o. female who is submitted for pre-surgical anesthesia review and clearance prior to her undergoing the above procedure. Patient is a Former Smoker (99 pack years; quit 01/2011). 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   Patient is followed by cardiology Andrea, MD). She was last seen in the cardiology clinic on 10/29/2024; notes reviewed. At the time of her clinic visit, patient experiencing episodes of nocturnal irregular heartbeat.  Despite beta-blocker therapy, episodes becoming more frequent in nature.  Due to associated bradycardia, beta-blocker dose limited.  She denies any episodes of chest pain, shortness breath, PND, orthopnea, significant peripheral edema, vertiginous symptoms, or presyncope/syncope.  Patient with a past medical history significant for cardiovascular diagnoses.  Documented physical exam was grossly benign, providing no evidence of acute exacerbation and/or decompensation of the patient's known cardiovascular conditions.  TEE with bubble study performed on 03/10/2021 revealing a normal left ventricular systolic function with an EF of >55%.  There was no evidence of LAA thrombus.  Saline microcavitation study showed atrial level shunting at rest.  Patient went on to have an LAA closure the following day (03/11/2021).  24 mm Watchman device was placed.  Most recent TTE performed on 09/05/2023 revealed a normal left ventricular systolic function with an EF of >55%. There were no regional wall motion abnormalities.  Left ventricular diastolic Doppler parameters normal. Right ventricular size and function normal.  RVSP = 31.1 mmHg.  There was trivial mitral and mild tricuspid and pulmonary valve regurgitation.  All transvalvular gradients were noted to be normal providing no evidence of hemodynamically significant valvular stenosis. Aorta normal in size with no evidence of ectasia or aneurysmal dilatation.  Long-term cardiac event monitor study performed on 06/08/2024 revealed a predominant underlying sinus rhythm at an average rate of 66 bpm range 34-133 bpm.  There was occasional atrial  and ventricular ectopy.  Additionally, there were brief runs of PSVT with the longest lasting 19 seconds.  There were no sustained arrhythmias or prolonged pauses.  No patient triggered events.  Patient with an atrial fibrillation diagnosis; CHA2DS2-VASc Score = 6 (sex, HFpEF, HTN, PE/DVT x 2, vascular disease).again, patient is status post LAA closure device placement (Watchman) on 03/11/2021.  Her rate and rhythm are currently being maintained on oral sotalol  + propranolol .  She does not require chronic oral anticoagulation at this point.  Despite LAA closure and use of beta-blocker therapy, patient continues to have irregular rhythm. Blood pressure well-controlled at 110/78 mmHg on currently prescribed diuretic (furosemide ) and beta-blocker (sotalol  + propranolol ) therapies.  Patient is on rosuvastatin  for her HLD diagnosis and further ASCVD prevention.  She is not diabetic.  Patient does have an OSAH diagnosis and is reportedly compliant with prescribed nocturnal PAP therapy.  Functional capacity limited by patient's multiple medical comorbidities.  With that said, patient able to complete all of her ADLs/IADLs without significant cardiovascular limitation.  Per the [facility], patient is able to achieve at least 4 METS of physical activity without experiencing any significant increase of angina/anginal equivalent symptoms.  No changes were made to her medication regimen.  Patient follow-up with outpatient cardiology in 6 months or sooner if needed.  [name] is scheduled for an elective INSERTION, PULSE GENERATOR, NEUROSTIMULATOR, SPINAL (Right) on 12/11/2024 5 with Dr. Penne Sharps, MD.  Given patient's past medical history significant for cardiovascular diagnoses, presurgical cardiac clearance was sought by the PAT team. Per cardiology, this patient is optimized for surgery and may proceed with the planned procedural course with a LOW risk of significant perioperative cardiovascular  complications.  In review of her medication reconciliation, patient is taking Trental .  She was advised on the recommendation of her cardiologist for continuing this medication throughout her perioperative course.  Patient will hold her dose on the morning of her procedure only.  Patient denies previous perioperative complications with anesthesia in the past. In review her EMR, it is noted that patient underwent a general anesthetic course here at [facility] (ASA III) in 07/2023 without documented complications.   MOST RECENT VITAL SIGNS:    09/12/2024   10:26 AM 07/19/2024   11:51 AM 07/19/2024   11:30 AM  Vitals with BMI  Height 5' 2    Weight 191 lbs    BMI 34.93    Systolic 124 105 892  Diastolic 72 60 77  Pulse  58 55   PROVIDERS/SPECIALISTS: NOTE: Primary physician provider listed below. Patient may have been seen by APP or partner within same practice.   PROVIDER ROLE / SPECIALTY LAST OV  Sharps Penne ORN, MD Neurosurgery (Surgeon) 09/12/2024  Rudolpho Norleen BIRCH, MD Primary Care Provider 11/30/2024  Ammon Blunt, MD Cardiology 10/29/2024  Debby Drivers, MD Electrophysiology 06/08/2024  Marcelino Nurse, MD Pain Management 07/04/2024  Parris Manna, MD Pulmonary Medicine 08/30/2024  Tobie Solo, MD Rheumatology 08/02/2024  Maree Hila, MD Neurology 08/22/2024   ALLERGIES: Allergies  Allergen Reactions   Advil [Ibuprofen] Hives   Flexeril [Cyclobenzaprine] Hives   Morphine Itching and Swelling   Nucala [Mepolizumab] Shortness Of Breath   Demerol Hcl [Meperidine] Itching and Swelling   Sulfa Antibiotics Swelling   Onion Itching    Can use onion powder    CURRENT HOME MEDICATIONS: No current facility-administered medications for this encounter.    albuterol  (VENTOLIN  HFA) 108 (90 Base) MCG/ACT inhaler   BAC 50-325-40 MG tablet   bisacodyl (DULCOLAX) 5 MG EC tablet   budesonide (PULMICORT) 0.5 MG/2ML nebulizer solution  carisoprodol  (SOMA ) 350 MG tablet   cholecalciferol  (VITAMIN D3) 25 MCG (1000 UNIT) tablet   clobetasol  cream (TEMOVATE ) 0.05 %   docusate sodium  (COLACE) 100 MG capsule   EMGALITY 120 MG/ML SOAJ   EPINEPHrine  0.3 mg/0.3 mL IJ SOAJ injection   Ferrous Sulfate (IRON PO)   folic acid  (FOLVITE ) 1 MG tablet   furosemide  (LASIX ) 20 MG tablet   gentamicin  ointment (GARAMYCIN ) 0.1 %   HYDROcodone -acetaminophen  (NORCO) 10-325 MG tablet   ipratropium-albuterol  (DUONEB) 0.5-2.5 (3) MG/3ML SOLN   methotrexate  2.5 MG tablet   montelukast  (SINGULAIR ) 10 MG tablet   Multiple Vitamin (MULTIVITAMIN WITH MINERALS) TABS tablet   naloxone  (NARCAN ) nasal spray 4 mg/0.1 mL   NURTEC 75 MG TBDP   pantoprazole  (PROTONIX ) 40 MG tablet   pentoxifylline  (TRENTAL ) 400 MG CR tablet   pimecrolimus  (ELIDEL ) 1 % cream   predniSONE  (DELTASONE ) 5 MG tablet   pregabalin  (LYRICA ) 75 MG capsule   promethazine  (PHENERGAN ) 25 MG tablet   propranolol  ER (INDERAL  LA) 80 MG 24 hr capsule   rosuvastatin  (CRESTOR ) 10 MG tablet   sotalol  (BETAPACE ) 80 MG tablet   sucralfate  (CARAFATE ) 1 g tablet   triamcinolone  cream (KENALOG ) 0.1 %   riTUXimab (RITUXAN IV)   HISTORY: Past Medical History:  Diagnosis Date   (HFpEF) heart failure with preserved ejection fraction (HCC)    Abnormal ANCA (antineutrophil cytoplasmic antibody)    Allergic rhinitis    Allergic rhinitis due to pollen    Anemia    Angina pectoris    Aortic atherosclerosis    Asthma-COPD overlap syndrome; 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    Atrial fibrillation [facility])    a.) CHA2DS2VASc = 6 (sex, HFpEF, HTN, PE/DVT x 2, vascular disease) as of 07/17/2024; b.) s/p LAA closure (Watchman) 03/11/2021; c.) rate/rhythm maintained on oral propranolol  + sotolol; no chronic OAC   Bradycardia    CAD (coronary artery disease)    Chronic maxillary sinusitis    Chronic pain syndrome (back)    Chronic right sacroiliac joint pain    Chronic, continuous use of opioids     a.) followed by pain management; has naloxone  Rx available   Churg-Strauss syndrome with lung involvement (HCC)    COVID-19 12/2021   Deep vein thrombosis (DVT) of left lower extremity (HCC) 12/2021   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    Full dentures    GERD (gastroesophageal reflux disease)    History of GI bleed    History of kidney stones    History of pulmonary embolism    HLD (hyperlipidemia)    HTN (hypertension)    ILD (interstitial lung disease) (HCC)    Leukocytoclastic vasculitis (HCC)    Leukocytosis    Lumbar radiculopathy    Migraine headache    Multiple pulmonary emboli (HCC) 01/05/2022   Nondiabetic gastroparesis    Obesity    OSA on CPAP    Osteoarthritis    Osteopenia    Patent foramen ovale    Pedal edema    Pneumonia due to COVID-19 virus    Presence of Watchman left atrial appendage closure device    PVC (premature ventricular contraction)    Rheumatoid arthritis (HCC)    a.) Tx'd with long term MTX + prednisone    Scoliosis of thoracolumbar spine    Spinal stenosis of lumbar region without neurogenic claudication    Status post insertion of spinal cord stimulator    Unequal leg length    Wears hearing aid in  both ears    Wegener's disease, pulmonary    Past Surgical History:  Procedure Laterality Date   APPENDECTOMY     CHOLECYSTECTOMY     COLONOSCOPY N/A 04/30/2022   Procedure: COLONOSCOPY;  Surgeon: Onita Elspeth Sharper, DO;  Location: [facility] ENDOSCOPY;  Service: Gastroenterology;  Laterality: N/A;   COLONOSCOPY WITH PROPOFOL  N/A 01/14/2023   Procedure: COLONOSCOPY WITH PROPOFOL ;  Surgeon: Onita Elspeth Sharper, DO;  Location: [facility] ENDOSCOPY;  Service: Gastroenterology;  Laterality: N/A;   ESOPHAGOGASTRODUODENOSCOPY N/A 04/30/2022   Procedure: ESOPHAGOGASTRODUODENOSCOPY (EGD);  Surgeon: Onita Elspeth Sharper, DO;  Location: [facility] ENDOSCOPY;  Service: Gastroenterology;  Laterality: N/A;    ESOPHAGOGASTRODUODENOSCOPY (EGD) WITH PROPOFOL  N/A 01/14/2023   Procedure: ESOPHAGOGASTRODUODENOSCOPY (EGD) WITH PROPOFOL ;  Surgeon: Onita Elspeth Sharper, DO;  Location: [facility] ENDOSCOPY;  Service: Gastroenterology;  Laterality: N/A;   KNEE ARTHROPLASTY Right    KNEE ARTHROSCOPY WITH MENISCAL REPAIR Left    LEFT ATRIAL APPENDAGE OCCLUSION N/A 03/11/2021   Procedure: LEFT ATRIAL APPENDAGE OCCLUSION (WATCHMAN DEVICE); Location: [facility]; Surgeon: Franky Ned, MD   MYRINGOTOMY WITH TUBE PLACEMENT Bilateral 08/17/2023   Procedure: MYRINGOTOMY WITH TUBE PLACEMENT;  Surgeon: Milissa Hamming, MD;  Location: [facility];  Service: ENT;  Laterality: Bilateral;   NASAL HEMORRHAGE CONTROL N/A 05/13/2021   Procedure: EPISTAXIS CONTROL;  Surgeon: Milissa Hamming, MD;  Location: [facility] ORS;  Service: ENT;  Laterality: N/A;   PULSE GENERATOR IMPLANT Left 06/23/2020   Procedure: LEFT FLANK PULSE GENERATOR IMPLANT;  Surgeon: Bluford Elspeth, MD;  Location: [facility] ORS;  Service: Neurosurgery;  Laterality: Left;   SHOULDER ARTHROSCOPY Bilateral    SPINAL CORD STIMULATOR REMOVAL Left 07/19/2024   Procedure: LUMBAR SPINAL CORD STIMULATOR REMOVAL;  Surgeon: Claudene Penne ORN, MD;  Location: [facility] ORS;  Service: Neurosurgery;  Laterality: Left;  LEFT INTERNAL PULSE GENERATOR REMOVAL   THORACIC LAMINECTOMY FOR SPINAL CORD STIMULATOR Bilateral 06/23/2020   Procedure: THORACIC SPINAL CORD STIMULATOR;  Surgeon: Bluford Elspeth, MD;  Location: [facility] ORS;  Service: Neurosurgery;  Laterality: Bilateral;   thumb surgery both hands Bilateral    done in [place]   VIDEO BRONCHOSCOPY WITH ENDOBRONCHIAL NAVIGATION N/A 02/27/2021   Procedure: VIDEO BRONCHOSCOPY WITH ENDOBRONCHIAL NAVIGATION;  Surgeon: Parris Manna, MD;  Location: [facility] ORS;  Service: Thoracic;  Laterality: N/A;   VIDEO BRONCHOSCOPY WITH ENDOBRONCHIAL ULTRASOUND N/A 02/27/2021   Procedure: VIDEO BRONCHOSCOPY WITH ENDOBRONCHIAL ULTRASOUND;  Surgeon: Parris Manna, MD;   Location: [facility] ORS;  Service: Thoracic;  Laterality: N/A;   watchman left atrial appendage closure device on 03/11/2021.      No family history on file. Social History   Tobacco Use   Smoking status: Former    Current packs/day: 0.00    Average packs/day: 3.0 packs/day for 33.0 years (99.0 ttl pk-yrs)    Types: Cigarettes    Start date: 02/06/1978    Quit date: 02/06/2011    Years since quitting: 13.8   Smokeless tobacco: Never  Substance Use Topics   Alcohol use: Not Currently   LABS:  Hospital Outpatient Visit on 12/07/2024  Component Date Value Ref Range Status   Sodium 12/07/2024 144  135 - 145 mmol/L Final   Potassium 12/07/2024 3.6  3.5 - 5.1 mmol/L Final   Chloride 12/07/2024 105  98 - 111 mmol/L Final   CO2 12/07/2024 27  22 - 32 mmol/L Final   Glucose, Bld 12/07/2024 91  70 - 99 mg/dL Final   Glucose reference range applies only to samples taken after fasting for at least 8 hours.  BUN 12/07/2024 13  6 - 20 mg/dL Final   Creatinine, Ser 12/07/2024 0.81  0.44 - 1.00 mg/dL Final   Calcium  12/07/2024 9.0  8.9 - 10.3 mg/dL Final   GFR, Estimated 12/07/2024 >60  >60 mL/min Final   Comment: (NOTE) Calculated using the CKD-EPI Creatinine Equation (2021)    Anion gap 12/07/2024 12  5 - 15 Final   Performed at [facility], [address] [place], [place] [address]   WBC 12/07/2024 9.3  4.0 - 10.5 K/uL Final   RBC 12/07/2024 3.99  3.87 - 5.11 MIL/uL Final   Hemoglobin 12/07/2024 11.9 (L)  12.0 - 15.0 g/dL Final   HCT 87/87/7974 36.3  36.0 - 46.0 % Final   MCV 12/07/2024 91.0  80.0 - 100.0 fL Final   MCH 12/07/2024 29.8  26.0 - 34.0 pg Final   MCHC 12/07/2024 32.8  30.0 - 36.0 g/dL Final   RDW 87/87/7974 13.8  11.5 - 15.5 % Final   Platelets 12/07/2024 209  150 - 400 K/uL Final   nRBC 12/07/2024 0.0  0.0 - 0.2 % Final   Performed at [facility], [address] [place], [place] [address]    ECG: Date: 06/08/2024  Time ECG obtained: 1208 PM Rate:  57 bpm Rhythm: sinus bradycardia Axis (leads I and aVF): normal Intervals: PR 132 ms. QRS 102 ms. QTc 441 ms. ST segment and T wave changes: No evidence of acute T wave abnormalities or significant ST segment elevation or depression.  Evidence of a possible, age undetermined, prior infarct:  No Comparison: Similar to previous tracing obtained on 04/09/2024 NOTE: Tracing obtained at [facility]; unable for review. Above based on cardiologist's interpretation.    IMAGING / PROCEDURES: LONG TERM CARDIAC EVENT MONITOR STUDY performed on  06/08/2024 Predominant underlying sinus rhythm at an average rate of 66 bpm range 34-133 bpm.   There was occasional atrial and ventricular ectopy.  There were brief runs of PSVT with the longest lasting 19 seconds.   There were no sustained arrhythmias or prolonged pauses.   No patient triggered events.  DG LUMBAR AND THORACIC SPINE 2-3 VIEWS performed on 07/11/2024 Spinal stimulator wiring tip at T7-8, previously mid T7 level with about 5 mm interval pullback. Thoracic and lumbar scoliosis and degenerative change. Osteopenia without evidence of fractures.  PULMONARY FUNCTION TESTING performed on 03/19/2024 FVC was 2.28 L, 76 % of predicted FEV1 was 1.82 L, 77 % of predicted FEV1/FVC ratio was 100 % of predicted FEF 25-75% liters per second was 86 % of predicted TLC was 91 % of predicted RV was 126 % of predicted DLCO was 73 % of predicted DLCO/VA was 96 % of predicted Good patient effort with fair repeatability. Pt c/o of fatigue and SOBOE. I walked her to front door of clinic with oximetry and sat's dropped from 97% @ rest to 93% on our short walk.   CT CHEST WO CONTRAST performed on 11/23/2023 Stable right middle lobe pulmonary nodule from prior exam, overall decreased in size from 2022. Findings consistent with benign etiology. Multifocal scarring within both lungs, some of which appears nodular. This is stable. Left apical ground-glass  nodule measuring 9 mm. This is likely an area of scarring with solid nodule present on remote prior exam, however recommend continued CT follow-up in 2 years to document stability. New 3 mm left apical pulmonary nodule, this can be reassessed on follow-up imaging. Mild emphysema Aortic atherosclerosis.   Coronary artery calcifications.   MR  LUMBAR SPINE WO CONTRAST performed on 11/10/2023 Diffuse lumbar spine spondylosis as described above. Moderate-severe left foraminal stenosis at L4-5.  Severe left foraminal stenosis at L5-S1. No acute osseous injury of the lumbar spine.  TRANSTHORACIC ECHOCARDIOGRAM performed on 09/05/2023 Normal left ventricular systolic function with an EF of >55% No regional wall motion abnormalities Normal right ventricular size and function Trivial MR Mild TR and PR Normal gradients; no valvular stenosis  IMPRESSION AND PLAN: Cassandra Brown has been referred for pre-anesthesia review and clearance prior to her undergoing the planned anesthetic and procedural courses. Available labs, pertinent testing, and imaging results were personally reviewed by me in preparation for upcoming operative/procedural course. [facility] medical record has been updated following extensive record review and patient interview with PAT staff.   This patient has been appropriately cleared by cardiology with an overall LOW risk of patient experiencing significant perioperative cardiovascular complications. Based on clinical review performed today (12/05/24), barring any significant acute changes in the patient's overall condition, it is anticipated that she will be able to proceed with the planned surgical intervention. Any acute changes in clinical condition may necessitate her procedure being postponed and/or cancelled. Patient will meet with anesthesia team (MD and/or CRNA) on the day of her procedure for preoperative evaluation/assessment. Questions regarding anesthetic course will be  fielded at that time.   Pre-surgical instructions were reviewed with the patient during his PAT appointment, and questions were fielded to satisfaction by PAT clinical staff. She has been instructed on which medications that she will need to hold prior to surgery, as well as the ones that have been deemed safe/appropriate to take on the day of her procedure. As part of the general education provided by PAT, patient made aware both verbally and in writing, that she would need to abstain from the use of any illegal substances during her perioperative course. She was advised that failure to follow the provided instructions could necessitate case cancellation or result in serious perioperative complications up to and including death. Patient encouraged to contact PAT and/or her surgeon's office to discuss any questions or concerns that may arise prior to surgery; verbalized understanding.   Dorise Pereyra, MSN, APRN, FNP-C, CEN [facility]  Perioperative Services Nurse Practitioner Phone: [phone] Fax: [phone] 12/05/24 11:02 AM  NOTE: This note has been prepared using Dragon dictation software. Despite my best ability to proofread, there is always the potential that unintentional transcriptional errors may still occur from this process.

## 2024-12-05 NOTE — Telephone Encounter (Signed)
 Received signed clearance from Dr Rudolpho.

## 2024-12-05 NOTE — Telephone Encounter (Addendum)
 Addendum 12/05/24: per Dr Claudene, hold pentoxifylline  (trental ) 5 days prior to surgery, can resume 2 days after surgery.   Sent updated clearance request form to Dr Rudolpho. I also called his office and Rosaline sent an urgent message back to his nurse.  I have notified Ms Fiorenza of the above directions and informed her that I will call her if the directions change once I receive a response from Dr Rudolpho. Otherwise, she will plan to hold the medication as listed above.

## 2024-12-07 ENCOUNTER — Encounter
Admission: RE | Admit: 2024-12-07 | Discharge: 2024-12-07 | Disposition: A | Source: Ambulatory Visit | Attending: Neurosurgery | Admitting: Neurosurgery

## 2024-12-07 DIAGNOSIS — R002 Palpitations: Secondary | ICD-10-CM

## 2024-12-07 DIAGNOSIS — I482 Chronic atrial fibrillation, unspecified: Secondary | ICD-10-CM

## 2024-12-07 DIAGNOSIS — E876 Hypokalemia: Secondary | ICD-10-CM

## 2024-12-07 DIAGNOSIS — Z01812 Encounter for preprocedural laboratory examination: Secondary | ICD-10-CM

## 2024-12-07 DIAGNOSIS — I5032 Chronic diastolic (congestive) heart failure: Secondary | ICD-10-CM

## 2024-12-07 LAB — CBC
HCT: 36.3 % (ref 36.0–46.0)
Hemoglobin: 11.9 g/dL — ABNORMAL LOW (ref 12.0–15.0)
MCH: 29.8 pg (ref 26.0–34.0)
MCHC: 32.8 g/dL (ref 30.0–36.0)
MCV: 91 fL (ref 80.0–100.0)
Platelets: 209 K/uL (ref 150–400)
RBC: 3.99 MIL/uL (ref 3.87–5.11)
RDW: 13.8 % (ref 11.5–15.5)
WBC: 9.3 K/uL (ref 4.0–10.5)
nRBC: 0 % (ref 0.0–0.2)

## 2024-12-07 LAB — BASIC METABOLIC PANEL WITH GFR
Anion gap: 12 (ref 5–15)
BUN: 13 mg/dL (ref 6–20)
CO2: 27 mmol/L (ref 22–32)
Calcium: 9 mg/dL (ref 8.9–10.3)
Chloride: 105 mmol/L (ref 98–111)
Creatinine, Ser: 0.81 mg/dL (ref 0.44–1.00)
GFR, Estimated: >60 mL/min (ref >60)
Glucose, Bld: 91 mg/dL (ref 70–99)
Potassium: 3.6 mmol/L (ref 3.5–5.1)
Sodium: 144 mmol/L (ref 135–145)

## 2024-12-11 ENCOUNTER — Encounter: Payer: Self-pay | Admitting: Neurosurgery

## 2024-12-11 ENCOUNTER — Other Ambulatory Visit: Payer: Self-pay

## 2024-12-11 ENCOUNTER — Encounter: Admitting: Urgent Care

## 2024-12-11 ENCOUNTER — Encounter: Admission: RE | Disposition: A | Payer: Self-pay | Source: Home / Self Care | Attending: Neurosurgery

## 2024-12-11 ENCOUNTER — Ambulatory Visit: Admitting: Urgent Care

## 2024-12-11 ENCOUNTER — Ambulatory Visit

## 2024-12-11 ENCOUNTER — Ambulatory Visit
Admission: RE | Admit: 2024-12-11 | Discharge: 2024-12-11 | Disposition: A | Attending: Neurosurgery | Admitting: Neurosurgery

## 2024-12-11 DIAGNOSIS — Z79891 Long term (current) use of opiate analgesic: Secondary | ICD-10-CM | POA: Diagnosis not present

## 2024-12-11 DIAGNOSIS — J849 Interstitial pulmonary disease, unspecified: Secondary | ICD-10-CM | POA: Diagnosis not present

## 2024-12-11 DIAGNOSIS — E785 Hyperlipidemia, unspecified: Secondary | ICD-10-CM | POA: Diagnosis not present

## 2024-12-11 DIAGNOSIS — M5416 Radiculopathy, lumbar region: Secondary | ICD-10-CM | POA: Diagnosis present

## 2024-12-11 DIAGNOSIS — I25118 Atherosclerotic heart disease of native coronary artery with other forms of angina pectoris: Secondary | ICD-10-CM | POA: Diagnosis not present

## 2024-12-11 DIAGNOSIS — G894 Chronic pain syndrome: Secondary | ICD-10-CM | POA: Diagnosis present

## 2024-12-11 DIAGNOSIS — T85192A Other mechanical complication of implanted electronic neurostimulator (electrode) of spinal cord, initial encounter: Secondary | ICD-10-CM | POA: Diagnosis present

## 2024-12-11 DIAGNOSIS — K219 Gastro-esophageal reflux disease without esophagitis: Secondary | ICD-10-CM | POA: Diagnosis not present

## 2024-12-11 DIAGNOSIS — J4489 Other specified chronic obstructive pulmonary disease: Secondary | ICD-10-CM | POA: Diagnosis not present

## 2024-12-11 DIAGNOSIS — I5032 Chronic diastolic (congestive) heart failure: Secondary | ICD-10-CM | POA: Diagnosis not present

## 2024-12-11 DIAGNOSIS — Q2112 Patent foramen ovale: Secondary | ICD-10-CM | POA: Diagnosis not present

## 2024-12-11 DIAGNOSIS — I4891 Unspecified atrial fibrillation: Secondary | ICD-10-CM | POA: Diagnosis not present

## 2024-12-11 DIAGNOSIS — Z87891 Personal history of nicotine dependence: Secondary | ICD-10-CM | POA: Diagnosis not present

## 2024-12-11 DIAGNOSIS — Z01818 Encounter for other preprocedural examination: Secondary | ICD-10-CM

## 2024-12-11 DIAGNOSIS — G4733 Obstructive sleep apnea (adult) (pediatric): Secondary | ICD-10-CM | POA: Diagnosis not present

## 2024-12-11 DIAGNOSIS — M313 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: Secondary | ICD-10-CM | POA: Diagnosis not present

## 2024-12-11 DIAGNOSIS — T85192D Other mechanical complication of implanted electronic neurostimulator (electrode) of spinal cord, subsequent encounter: Secondary | ICD-10-CM | POA: Diagnosis not present

## 2024-12-11 DIAGNOSIS — Z79899 Other long term (current) drug therapy: Secondary | ICD-10-CM | POA: Diagnosis not present

## 2024-12-11 DIAGNOSIS — M069 Rheumatoid arthritis, unspecified: Secondary | ICD-10-CM | POA: Diagnosis not present

## 2024-12-11 DIAGNOSIS — I110 Hypertensive heart disease with heart failure: Secondary | ICD-10-CM | POA: Diagnosis not present

## 2024-12-11 DIAGNOSIS — T85122A Displacement of implanted electronic neurostimulator (electrode) of spinal cord, initial encounter: Secondary | ICD-10-CM | POA: Diagnosis present

## 2024-12-11 DIAGNOSIS — Y752 Prosthetic and other implants, materials and neurological devices associated with adverse incidents: Secondary | ICD-10-CM | POA: Diagnosis not present

## 2024-12-11 DIAGNOSIS — T85122S Displacement of implanted electronic neurostimulator (electrode) of spinal cord, sequela: Secondary | ICD-10-CM

## 2024-12-11 DIAGNOSIS — G8929 Other chronic pain: Secondary | ICD-10-CM

## 2024-12-11 SURGERY — INSERTION, PULSE GENERATOR, NEUROSTIMULATOR, SPINAL
Anesthesia: General | Site: Back | Laterality: Right

## 2024-12-11 MED ORDER — BUPIVACAINE-EPINEPHRINE (PF) 0.5% -1:200000 IJ SOLN
INTRAMUSCULAR | Status: DC | PRN
  Administered 2024-12-11: 10:00:00 8 mL via PERINEURAL

## 2024-12-11 MED ORDER — ORAL CARE MOUTH RINSE: 15.0000 mL | Freq: Once | OROMUCOSAL | Status: AC

## 2024-12-11 MED ORDER — BUPIVACAINE HCL (PF) 0.5 % IJ SOLN
INTRAMUSCULAR | Status: AC
  Filled 2024-12-11: qty 30

## 2024-12-11 MED ORDER — LIDOCAINE HCL (CARDIAC) PF 100 MG/5ML IV SOSY
PREFILLED_SYRINGE | INTRAVENOUS | Status: DC | PRN
  Administered 2024-12-11: 10:00:00 100 mg via INTRAVENOUS

## 2024-12-11 MED ORDER — EPHEDRINE SULFATE-NACL 50-0.9 MG/10ML-% IV SOSY
PREFILLED_SYRINGE | INTRAVENOUS | Status: DC | PRN
  Administered 2024-12-11: 10:00:00 5 mg via INTRAVENOUS

## 2024-12-11 MED ORDER — OXYCODONE HCL 5 MG PO TABS
5.0000 mg | ORAL_TABLET | Freq: Four times a day (QID) | ORAL | 0 refills | Status: AC | PRN
  Filled 2024-12-11: qty 20, 5d supply, fill #0

## 2024-12-11 MED ORDER — CHLORHEXIDINE GLUCONATE 0.12 % MT SOLN
15.0000 mL | Freq: Once | OROMUCOSAL | Status: AC
  Administered 2024-12-11: 07:00:00 15 mL via OROMUCOSAL

## 2024-12-11 MED ORDER — CEFAZOLIN SODIUM-DEXTROSE 2-4 GM/100ML-% IV SOLN
INTRAVENOUS | Status: AC
  Filled 2024-12-11: qty 100

## 2024-12-11 MED ORDER — FENTANYL CITRATE (PF) 100 MCG/2ML IJ SOLN
INTRAMUSCULAR | Status: AC
  Filled 2024-12-11: qty 2

## 2024-12-11 MED ORDER — CHLORHEXIDINE GLUCONATE 0.12 % MT SOLN
OROMUCOSAL | Status: AC
  Filled 2024-12-11: qty 15

## 2024-12-11 MED ORDER — BUPIVACAINE-EPINEPHRINE (PF) 0.5% -1:200000 IJ SOLN
INTRAMUSCULAR | Status: AC
  Filled 2024-12-11: qty 10

## 2024-12-11 MED ORDER — BUPIVACAINE HCL 0.5 % IJ SOLN
INTRAMUSCULAR | Status: DC | PRN
  Administered 2024-12-11: 10:00:00 20 mL

## 2024-12-11 MED ORDER — FENTANYL CITRATE (PF) 100 MCG/2ML IJ SOLN
INTRAMUSCULAR | Status: DC | PRN
  Administered 2024-12-11 (×2): 50 ug via INTRAVENOUS

## 2024-12-11 MED ORDER — OXYCODONE HCL 5 MG/5ML PO SOLN: 5.0000 mg | Freq: Once | ORAL | Status: AC | PRN

## 2024-12-11 MED ORDER — SUGAMMADEX SODIUM 200 MG/2ML IV SOLN
INTRAVENOUS | Status: DC | PRN
  Administered 2024-12-11: 11:00:00 200 mg via INTRAVENOUS

## 2024-12-11 MED ORDER — DROPERIDOL 2.5 MG/ML IJ SOLN: 0.6250 mg | Freq: Once | INTRAMUSCULAR | Status: DC | PRN

## 2024-12-11 MED ORDER — ACETAMINOPHEN 10 MG/ML IV SOLN: 1000.0000 mg | Freq: Once | INTRAVENOUS | Status: DC | PRN

## 2024-12-11 MED ORDER — VANCOMYCIN HCL IN DEXTROSE 1-5 GM/200ML-% IV SOLN
1000.0000 mg | Freq: Once | INTRAVENOUS | Status: AC
  Administered 2024-12-11: 08:00:00 1000 mg via INTRAVENOUS

## 2024-12-11 MED ORDER — OXYCODONE HCL 5 MG PO TABS
5.0000 mg | ORAL_TABLET | Freq: Once | ORAL | Status: AC | PRN
  Administered 2024-12-11: 12:00:00 5 mg via ORAL

## 2024-12-11 MED ORDER — SURGIFLO WITH THROMBIN (HEMOSTATIC MATRIX KIT) OPTIME
TOPICAL | Status: DC | PRN
  Administered 2024-12-11: 10:00:00 1 via TOPICAL

## 2024-12-11 MED ORDER — ROCURONIUM BROMIDE 100 MG/10ML IV SOLN
INTRAVENOUS | Status: DC | PRN
  Administered 2024-12-11 (×2): 20 mg via INTRAVENOUS
  Administered 2024-12-11: 10:00:00 30 mg via INTRAVENOUS

## 2024-12-11 MED ORDER — 0.9 % SODIUM CHLORIDE (POUR BTL) OPTIME
TOPICAL | Status: DC | PRN
  Administered 2024-12-11: 10:00:00 500 mL

## 2024-12-11 MED ORDER — OXYCODONE HCL 5 MG PO TABS
ORAL_TABLET | ORAL | Status: AC
  Filled 2024-12-11: qty 1

## 2024-12-11 MED ORDER — MIDAZOLAM HCL (PF) 2 MG/2ML IJ SOLN
INTRAMUSCULAR | Status: DC | PRN
  Administered 2024-12-11: 09:00:00 2 mg via INTRAVENOUS

## 2024-12-11 MED ORDER — PHENYLEPHRINE HCL-NACL 20-0.9 MG/250ML-% IV SOLN
INTRAVENOUS | Status: AC
  Filled 2024-12-11: qty 250

## 2024-12-11 MED ORDER — IRRISEPT - 450ML BOTTLE WITH 0.05% CHG IN STERILE WATER, USP 99.95% OPTIME
TOPICAL | Status: DC | PRN
  Administered 2024-12-11: 11:00:00 450 mL

## 2024-12-11 MED ORDER — SUCCINYLCHOLINE CHLORIDE 200 MG/10ML IV SOSY
PREFILLED_SYRINGE | INTRAVENOUS | Status: DC | PRN
  Administered 2024-12-11: 10:00:00 100 mg via INTRAVENOUS

## 2024-12-11 MED ORDER — VANCOMYCIN HCL 1000 MG IV SOLR
INTRAVENOUS | Status: DC | PRN
  Administered 2024-12-11: 09:00:00 1000 mg via INTRAVENOUS

## 2024-12-11 MED ORDER — LACTATED RINGERS IV SOLN: INTRAVENOUS | Status: DC

## 2024-12-11 MED ORDER — MIDAZOLAM HCL 2 MG/2ML IJ SOLN
INTRAMUSCULAR | Status: AC
  Filled 2024-12-11: qty 2

## 2024-12-11 MED ORDER — PROPOFOL 10 MG/ML IV BOLUS
INTRAVENOUS | Status: AC
  Filled 2024-12-11: qty 20

## 2024-12-11 MED ORDER — FENTANYL CITRATE (PF) 100 MCG/2ML IJ SOLN
25.0000 ug | INTRAMUSCULAR | Status: DC | PRN
  Administered 2024-12-11 (×4): 25 ug via INTRAVENOUS

## 2024-12-11 MED ORDER — PHENYLEPHRINE HCL-NACL 20-0.9 MG/250ML-% IV SOLN
INTRAVENOUS | Status: DC | PRN
  Administered 2024-12-11: 10:00:00 30 ug/min via INTRAVENOUS

## 2024-12-11 MED ORDER — VASOPRESSIN 20 UNIT/ML IV SOLN
INTRAVENOUS | Status: DC | PRN
  Administered 2024-12-11 (×2): 4 [IU] via INTRAVENOUS

## 2024-12-11 MED ORDER — DEXMEDETOMIDINE HCL IN NACL 200 MCG/50ML IV SOLN
INTRAVENOUS | Status: DC | PRN
  Administered 2024-12-11: 11:00:00 20 ug via INTRAVENOUS

## 2024-12-11 MED ORDER — PROPOFOL 10 MG/ML IV BOLUS
INTRAVENOUS | Status: DC | PRN
  Administered 2024-12-11: 10:00:00 150 mg via INTRAVENOUS

## 2024-12-11 MED ORDER — GLYCOPYRROLATE 0.2 MG/ML IJ SOLN
INTRAMUSCULAR | Status: DC | PRN
  Administered 2024-12-11: 10:00:00 .2 mg via INTRAVENOUS

## 2024-12-11 MED ORDER — VANCOMYCIN HCL IN DEXTROSE 1-5 GM/200ML-% IV SOLN
INTRAVENOUS | Status: AC
  Filled 2024-12-11: qty 200

## 2024-12-11 MED ORDER — DEXAMETHASONE SOD PHOSPHATE PF 10 MG/ML IJ SOLN
INTRAMUSCULAR | Status: DC | PRN
  Administered 2024-12-11: 10:00:00 10 mg via INTRAVENOUS

## 2024-12-11 MED ORDER — ACETAMINOPHEN 10 MG/ML IV SOLN
INTRAVENOUS | Status: DC | PRN
  Administered 2024-12-11: 10:00:00 1000 mg via INTRAVENOUS

## 2024-12-11 MED ORDER — ONDANSETRON HCL 4 MG/2ML IJ SOLN
INTRAMUSCULAR | Status: DC | PRN
  Administered 2024-12-11 (×2): 4 mg via INTRAVENOUS

## 2024-12-11 MED ORDER — PHENYLEPHRINE 80 MCG/ML (10ML) SYRINGE FOR IV PUSH (FOR BLOOD PRESSURE SUPPORT)
PREFILLED_SYRINGE | INTRAVENOUS | Status: DC | PRN
  Administered 2024-12-11: 10:00:00 160 ug via INTRAVENOUS
  Administered 2024-12-11: 10:00:00 80 ug via INTRAVENOUS

## 2024-12-11 SURGICAL SUPPLY — 41 items
BELT PT INTERSTIM MICRO SYSTEM (MISCELLANEOUS) IMPLANT
BRUSH SCRUB EZ 4% CHG (MISCELLANEOUS) ×1 IMPLANT
BUR NEURO DRILL SOFT 3.0X3.8M (BURR) ×1 IMPLANT
CONTROLLER HANDSET COMM KIT (NEUROSURGERY SUPPLIES) IMPLANT
DERMABOND ADVANCED .7 DNX12 (GAUZE/BANDAGES/DRESSINGS) ×2 IMPLANT
DRAPE C-ARM XRAY 36X54 (DRAPES) ×1 IMPLANT
DRAPE LAPAROTOMY 100X77 ABD (DRAPES) ×1 IMPLANT
DRSG OPSITE POSTOP 4X6 (GAUZE/BANDAGES/DRESSINGS) IMPLANT
DRSG OPSITE POSTOP 4X8 (GAUZE/BANDAGES/DRESSINGS) IMPLANT
ELECTRODE REM PT RTRN 9FT ADLT (ELECTROSURGICAL) ×1 IMPLANT
FEE INTRAOP CADWELL SUPPLY NCS (MISCELLANEOUS) IMPLANT
FEE INTRAOP MONITOR IMPULS NCS (MISCELLANEOUS) IMPLANT
GLOVE BIOGEL PI IND STRL 6.5 (GLOVE) IMPLANT
GLOVE BIOGEL PI IND STRL 7.0 (GLOVE) ×1 IMPLANT
GLOVE BIOGEL PI IND STRL 8 (GLOVE) ×1 IMPLANT
GLOVE SRG 8 PF TXTR STRL LF DI (GLOVE) ×1 IMPLANT
GLOVE SURG SYN 6.5 PF PI (GLOVE) IMPLANT
GLOVE SURG SYN 7.0 PF PI (GLOVE) ×1 IMPLANT
GLOVE SURG SYN 7.5 PF PI (GLOVE) ×1 IMPLANT
GOWN SRG LRG LVL 4 IMPRV REINF (GOWNS) ×1 IMPLANT
GOWN SRG XL LVL 3 NONREINFORCE (GOWNS) ×1 IMPLANT
KIT NEURO ACCESS (MISCELLANEOUS) IMPLANT
KIT SPINAL PRONEVIEW (KITS) ×1 IMPLANT
LAVAGE JET IRRISEPT WOUND (IRRIGATION / IRRIGATOR) ×1 IMPLANT
MANIFOLD NEPTUNE II (INSTRUMENTS) ×1 IMPLANT
NDL SAFETY ECLIP 18X1.5 (MISCELLANEOUS) ×1 IMPLANT
NEUROSTIM INCEPTIV (Neuro Prosthesis/Implant) IMPLANT
NS IRRIG 500ML POUR BTL (IV SOLUTION) ×1 IMPLANT
PACK LAMINECTOMY ARMC (PACKS) ×1 IMPLANT
PAD ARMBOARD POSITIONER FOAM (MISCELLANEOUS) ×1 IMPLANT
POUCH TYRX ANTIBAC NEURO MED (Mesh General) IMPLANT
PROGRAMMER AND COMM CASE (NEUROSURGERY SUPPLIES) IMPLANT
RECHARGER SYSTEM (NEUROSURGERY SUPPLIES) IMPLANT
STAPLER SKIN PROX 35W (STAPLE) IMPLANT
SURGIFLO W/THROMBIN 8M KIT (HEMOSTASIS) ×1 IMPLANT
SUT SILK 2 0SH CR/8 30 (SUTURE) ×1 IMPLANT
SUT STRATA 3-0 15 PS-2 (SUTURE) IMPLANT
SUT VIC AB 0 CT1 18XCR BRD 8 (SUTURE) ×2 IMPLANT
SUT VIC AB 2-0 CT1 18 (SUTURE) ×2 IMPLANT
SYR 30ML LL (SYRINGE) ×2 IMPLANT
TRAP FLUID SMOKE EVACUATOR (MISCELLANEOUS) ×1 IMPLANT

## 2024-12-11 NOTE — Anesthesia Preprocedure Evaluation (Signed)
 Anesthesia Evaluation  Patient identified by MRN, date of birth, ID band Patient awake    Reviewed: Allergy & Precautions, H&P , NPO status , Patient's Chart, lab work & pertinent test results, reviewed documented beta blocker date and time   Airway Mallampati: II  TM Distance: >3 FB Neck ROM: full    Dental  (+) Teeth Intact   Pulmonary asthma , sleep apnea , pneumonia, resolved, COPD, former smoker   Pulmonary exam normal        Cardiovascular Exercise Tolerance: Good hypertension, On Medications + angina with exertion + CAD and +CHF  Normal cardiovascular exam Rate:Normal     Neuro/Psych  Headaches PSYCHIATRIC DISORDERS         GI/Hepatic Neg liver ROS,GERD  Medicated,,  Endo/Other  negative endocrine ROS    Renal/GU negative Renal ROS  negative genitourinary   Musculoskeletal   Abdominal   Peds  Hematology  (+) Blood dyscrasia, anemia   Anesthesia Other Findings   Reproductive/Obstetrics negative OB ROS                              Anesthesia Physical Anesthesia Plan  ASA: 4  Anesthesia Plan: General LMA   Post-op Pain Management:    Induction:   PONV Risk Score and Plan: 4 or greater  Airway Management Planned:   Additional Equipment:   Intra-op Plan:   Post-operative Plan:   Informed Consent: I have reviewed the patients History and Physical, chart, labs and discussed the procedure including the risks, benefits and alternatives for the proposed anesthesia with the patient or authorized representative who has indicated his/her understanding and acceptance.       Plan Discussed with: CRNA  Anesthesia Plan Comments:         Anesthesia Quick Evaluation

## 2024-12-11 NOTE — Anesthesia Procedure Notes (Signed)
 Procedure Name: Intubation Date/Time: 12/11/2024 9:36 AM  Performed by: Ledora Duncan, CRNAPre-anesthesia Checklist: Patient identified, Emergency Drugs available, Suction available and Patient being monitored Patient Re-evaluated:Patient Re-evaluated prior to induction Oxygen Delivery Method: Circle system utilized Preoxygenation: Pre-oxygenation with 100% oxygen Induction Type: IV induction Ventilation: Mask ventilation without difficulty Laryngoscope Size: McGrath and 3 Grade View: Grade I Tube type: Oral Tube size: 7.0 mm Number of attempts: 1 Airway Equipment and Method: Stylet Placement Confirmation: ETT inserted through vocal cords under direct vision, positive ETCO2 and breath sounds checked- equal and bilateral Secured at: 21 cm Tube secured with: Tape Dental Injury: Teeth and Oropharynx as per pre-operative assessment

## 2024-12-11 NOTE — Discharge Instructions (Addendum)
 Your surgeon has performed an procedure implanting a Battery for Spinal Cord Stimulator Alone. This involved making an incision in the Flank.   The following are instructions to help in your recovery once you have been discharged from the hospital. Even if you feel well, it is important that you follow these activity guidelines. If you do not let your body heal properly from the surgery, you can increase the chance of return of your symptoms and other complications.  You will be working with your spinal cord stimulator team for programming and titrations.  Most patients require multiple titrations before we get the settings just right.  We appreciate your patience and working with [place] to optimize your care.  *NSAIDs are okay to take after surgery  Activity    No bending, lifting, or twisting ("BLT"). Avoid lifting objects heavier than 10 pounds (gallon milk jug).  Where possible, avoid household activities that involve lifting, bending, reaching, pushing, or pulling such as laundry, vacuuming, grocery shopping, and childcare. Try to arrange for help from friends and family for these activities while your back heals.  Increase physical activity slowly as tolerated.  Taking short walks is encouraged, but avoid strenuous exercise. Do not jog, run, bicycle, lift weights, or participate in any other exercises unless specifically allowed by your doctor.  Talk to your doctor before resuming sexual activity.  You should not drive until cleared by your doctor.  Until released by your doctor, you should not return to work or school.  You should rest at home and let your body heal.   You may shower three days after your surgery.  After showering, lightly dab your incision dry. Do not take a tub bath or go swimming until approved by your doctor at your follow-up appointment.  If you smoke, we strongly recommend that you quit.  Smoking has been proven to interfere with normal bone healing and will dramatically  reduce the success rate of your surgery. Please contact [facility] (800-QUIT-NOW) and use the resources at www.[facility].com for assistance in stopping smoking.  Surgical Incision   If you have a dressing on your incision, you may remove it 3 days after your surgery. Keep your incision area clean and dry.  Your stitches are under your skin and dissolvable.  They are covered by surgical glue.  The glue should begin to peel away within about a week.  Diet           You may return to your usual diet. Be sure to stay hydrated.  When to Contact us  You may experience pain in your neck and/or pain between your shoulder blades, or at your back depending on your incision sites.. This is normal and should improve in the next few weeks with the help of pain medication, muscle relaxers, and rest. Some patients report that a warm compress on the back of the neck or between the shoulder blades helps.  However, should you experience any of the following, contact us immediately: New numbness or weakness Pain that is progressively getting worse, and is not relieved by your pain medication, muscle relaxers, rest, and warm compresses Bleeding, redness, swelling, pain, or drainage from surgical incision Chills or flu-like symptoms Fever greater than 101.0 F (38.3 C) Inability to eat, drink fluids, or take medications Problems with bowel or bladder functions Difficulty breathing or shortness of breath Warmth, tenderness, or swelling in your calf Contact Information How to contact us:  If you have any questions/concerns before or after surgery, you  can reach [place] at [phone], or you can send a mychart message. We can be reached by phone or mychart 8am-4pm, Monday-Friday.  *Please note: Calls after 4pm are forwarded to a third party answering service. Mychart messages are not routinely monitored during evenings, weekends, and holidays. Please call our office to contact the answering service for urgent  concerns during non-business hours.

## 2024-12-11 NOTE — Interval H&P Note (Signed)
 History and Physical Interval Note:  12/11/2024 9:21 AM  Cassandra Brown  has presented today for surgery, with the diagnosis of T85.192A G89.4.  The various methods of treatment have been discussed with the patient and family. After consideration of risks, benefits and other options for treatment, the patient has consented to  Procedures with comments: INSERTION, PULSE GENERATOR, NEUROSTIMULATOR, SPINAL (Right) - Right sided internal pulse generator (IPG) placement as a surgical intervention.  The patient's history has been reviewed, patient examined, no change in status, stable for surgery.  I have reviewed the patient's chart and labs.  Questions were answered to the patient's satisfaction.    Heart and lungs clear   Penne LELON Sharps

## 2024-12-11 NOTE — Anesthesia Postprocedure Evaluation (Signed)
 Anesthesia Post Note  Patient: Cassandra Brown  Procedure(s) Performed: INSERTION, PULSE GENERATOR, NEUROSTIMULATOR, SPINAL (Right: Back)  Patient location during evaluation: PACU Anesthesia Type: General Level of consciousness: awake and alert Pain management: pain level controlled Vital Signs Assessment: post-procedure vital signs reviewed and stable Respiratory status: spontaneous breathing, nonlabored ventilation, respiratory function stable and patient connected to nasal cannula oxygen Cardiovascular status: blood pressure returned to baseline and stable Postop Assessment: no apparent nausea or vomiting Anesthetic complications: no   No notable events documented.   Last Vitals:  Vitals:   12/11/24 1200 12/11/24 1211  BP: (!) 95/50   Pulse: 64   Resp: (!) 22   Temp:  36.8 C  SpO2: 93%     Last Pain:  Vitals:   12/11/24 1211  TempSrc: Temporal  PainSc:                  Lynwood KANDICE Clause

## 2024-12-11 NOTE — Transfer of Care (Signed)
 Immediate Anesthesia Transfer of Care Note  Patient: Cassandra Brown  Procedure(s) Performed: INSERTION, PULSE GENERATOR, NEUROSTIMULATOR, SPINAL (Right: Back)  Patient Location: PACU  Anesthesia Type:General  Level of Consciousness: awake, drowsy, and patient cooperative  Airway & Oxygen Therapy: Patient Spontanous Breathing and Patient connected to face mask oxygen  Post-op Assessment: Report given to RN and Post -op Vital signs reviewed and stable  Post vital signs: Reviewed and stable  Last Vitals:  Vitals Value Taken Time  BP 148/73 12/11/24 11:06  Temp    Pulse 76 12/11/24 11:09  Resp 5 12/11/24 11:09  SpO2 100 % 12/11/24 11:09  Vitals shown include unfiled device data.  Last Pain:  Vitals:   12/11/24 0719  PainSc: 8          Complications: No notable events documented.

## 2024-12-11 NOTE — H&P (Signed)
 Referring Physician:  No referring provider defined for this encounter.  Primary Physician:  Rudolpho Norleen BIRCH, MD  History of Present Illness: 12/11/2024 [name] is here today with a chief complaint of severe pain and mobility of her battery at the insertion site, status post implant removal.  Here today for replacement and site revision.  Past Surgery:  06/23/2020-THORACIC LAMINECTOMY FOR SPINAL CORD STIMULATOR   The symptoms are causing a significant impact on the patient's life.   I have utilized the care everywhere function in epic to review the outside records available from external health systems.  Review of Systems:  A 10 point review of systems is negative, except for the pertinent positives and negatives detailed in the HPI.  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    Atrial fibrillation [facility])    a.) CHA2DS2VASc = 6 (sex, HFpEF, HTN, PE/DVT x 2, vascular disease) as of 07/17/2024; b.) s/p LAA closure (Watchman) 03/11/2021; c.) rate/rhythm maintained on oral propranolol  + sotolol; no chronic OAC   Bradycardia    CAD (coronary artery disease)    Chronic maxillary sinusitis    Chronic pain syndrome (back)    Chronic right sacroiliac joint pain    Chronic, continuous use of opioids    a.) followed by pain management; has naloxone  Rx available   Churg-Strauss syndrome with lung involvement (HCC)    COVID-19 12/2021   Deep vein thrombosis (DVT) of left lower extremity (HCC) 12/2021   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    Full dentures    GERD (gastroesophageal reflux disease)    History of GI bleed     History of kidney stones    History of pulmonary embolism    HLD (hyperlipidemia)    HTN (hypertension)    ILD (interstitial lung disease) (HCC)    Leukocytoclastic vasculitis (HCC)    Leukocytosis    Lumbar radiculopathy    Migraine headache    Multiple pulmonary emboli (HCC) 01/05/2022   Nondiabetic gastroparesis    Obesity    OSA on CPAP    Osteoarthritis    Osteopenia    Patent foramen ovale    Pedal edema    Pneumonia due to COVID-19 virus    Presence of Watchman left atrial appendage closure device    PVC (premature ventricular contraction)    Rheumatoid arthritis (HCC)    a.) Tx'd with long term MTX + prednisone    Scoliosis of thoracolumbar spine    Spinal stenosis of lumbar region without neurogenic claudication    Status post insertion of spinal cord stimulator    Unequal leg length    Wears hearing aid in both ears    Wegener's disease, pulmonary     Past Surgical History: Past Surgical History:  Procedure Laterality Date   APPENDECTOMY     CHOLECYSTECTOMY     COLONOSCOPY N/A 04/30/2022   Procedure: COLONOSCOPY;  Surgeon: Onita Elspeth Sharper, DO;  Location: [facility] ENDOSCOPY;  Service: Gastroenterology;  Laterality: N/A;   COLONOSCOPY WITH PROPOFOL  N/A 01/14/2023   Procedure: COLONOSCOPY WITH PROPOFOL ;  Surgeon: Onita Elspeth Sharper, DO;  Location: [facility] ENDOSCOPY;  Service: Gastroenterology;  Laterality: N/A;   ESOPHAGOGASTRODUODENOSCOPY N/A 04/30/2022   Procedure: ESOPHAGOGASTRODUODENOSCOPY (EGD);  Surgeon: Onita Elspeth Sharper, DO;  Location: [facility] ENDOSCOPY;  Service: Gastroenterology;  Laterality: N/A;   ESOPHAGOGASTRODUODENOSCOPY (EGD) WITH PROPOFOL  N/A 01/14/2023   Procedure: ESOPHAGOGASTRODUODENOSCOPY (EGD) WITH PROPOFOL ;  Surgeon: Onita Elspeth Sharper, DO;  Location: [facility] ENDOSCOPY;  Service: Gastroenterology;  Laterality: N/A;   KNEE ARTHROPLASTY Right    KNEE ARTHROSCOPY WITH MENISCAL REPAIR Left    LEFT ATRIAL APPENDAGE OCCLUSION N/A 03/11/2021    Procedure: LEFT ATRIAL APPENDAGE OCCLUSION (WATCHMAN DEVICE); Location: [facility]; Surgeon: Franky Ned, MD   MYRINGOTOMY WITH TUBE PLACEMENT Bilateral 08/17/2023   Procedure: MYRINGOTOMY WITH TUBE PLACEMENT;  Surgeon: Milissa Hamming, MD;  Location: [facility];  Service: ENT;  Laterality: Bilateral;   NASAL HEMORRHAGE CONTROL N/A 05/13/2021   Procedure: EPISTAXIS CONTROL;  Surgeon: Milissa Hamming, MD;  Location: [facility] ORS;  Service: ENT;  Laterality: N/A;   PULSE GENERATOR IMPLANT Left 06/23/2020   Procedure: LEFT FLANK PULSE GENERATOR IMPLANT;  Surgeon: Bluford Elspeth, MD;  Location: [facility] ORS;  Service: Neurosurgery;  Laterality: Left;   SHOULDER ARTHROSCOPY Bilateral    SPINAL CORD STIMULATOR REMOVAL Left 07/19/2024   Procedure: LUMBAR SPINAL CORD STIMULATOR REMOVAL;  Surgeon: Claudene Penne ORN, MD;  Location: [facility] ORS;  Service: Neurosurgery;  Laterality: Left;  LEFT INTERNAL PULSE GENERATOR REMOVAL   THORACIC LAMINECTOMY FOR SPINAL CORD STIMULATOR Bilateral 06/23/2020   Procedure: THORACIC SPINAL CORD STIMULATOR;  Surgeon: Bluford Elspeth, MD;  Location: [facility] ORS;  Service: Neurosurgery;  Laterality: Bilateral;   thumb surgery both hands Bilateral    done in [place]   VIDEO BRONCHOSCOPY WITH ENDOBRONCHIAL NAVIGATION N/A 02/27/2021   Procedure: VIDEO BRONCHOSCOPY WITH ENDOBRONCHIAL NAVIGATION;  Surgeon: Parris Manna, MD;  Location: [facility] ORS;  Service: Thoracic;  Laterality: N/A;   VIDEO BRONCHOSCOPY WITH ENDOBRONCHIAL ULTRASOUND N/A 02/27/2021   Procedure: VIDEO BRONCHOSCOPY WITH ENDOBRONCHIAL ULTRASOUND;  Surgeon: Parris Manna, MD;  Location: [facility] ORS;  Service: Thoracic;  Laterality: N/A;   watchman left atrial appendage closure device on 03/11/2021.       Allergies: Allergies as of 10/18/2024 - Review Complete 09/12/2024  Allergen Reaction Noted   Cyclobenzaprine Hives 12/17/2010   Ibuprofen Hives 11/27/2009   Mepolizumab Shortness Of Breath 07/11/2024   Morphine Itching and  Swelling 12/17/2010   Meperidine Itching and Swelling 12/29/2011   Sulfa antibiotics Swelling 01/28/2020   Onion Itching 01/01/2019    Medications:  Current Facility-Administered Medications:    chlorhexidine  (PERIDEX ) 0.12 % solution 15 mL, 15 mL, Mouth/Throat, Once **OR** Oral care mouth rinse, 15 mL, Mouth Rinse, Once, Dario Barter, MD   lactated ringers  infusion, , Intravenous, Continuous, Dario Barter, MD   vancomycin  (VANCOCIN ) IVPB 1000 mg/200 mL premix, 1,000 mg, Intravenous, Once, Claudene Penne ORN, MD  Social History: Social History   Tobacco Use   Smoking status: Former    Current packs/day: 0.00    Average packs/day: 3.0 packs/day for 33.0 years (99.0 ttl pk-yrs)    Types: Cigarettes    Start date: 02/06/1978    Quit date: 02/06/2011    Years since quitting: 13.8   Smokeless tobacco: Never  Vaping Use   Vaping status: Never Used  Substance Use Topics   Alcohol use: Not Currently   Drug use: Never    Family Medical History: History reviewed. No pertinent family history.  Physical Examination: There were no vitals filed for this visit.   General: Patient is in no apparent distress. Attention to examination is appropriate.  Neck:  Supple.  Full range of motion.  Respiratory: Patient is breathing without any difficulty.   NEUROLOGICAL:     Awake, alert, oriented to person, place, and time.  Speech is clear and fluent.   Strength:  Side Iliopsoas Quads Hamstring PF DF EHL  R 5 5 5 5 5 5   L 5 5 5 5 5 5    Previous incision well-healed without evidence of fluid collection  Medical Decision Making/Assessment and Plan: Ms. Sedor is a pleasant 58 y.o. female with history of a thoracic spinal cord stimulator placement.  She has always had chronic issues with the battery placement site, however this exacerbation has become incredibly severe and is now debilitating.  We removed her IPG and tested the site given the fluid collection.  It was aseptic.  She is  here today for laterality change and placement of a new IPG on the contralateral side.  We discussed risk and benefits of surgery including but not limited to bleeding infection need for removal, hardware damage, damage to the leads.  Need for further surgery.  Need for further removal.  Thank you for involving me in the care of this patient.    Penne MICAEL Sharps MD/MSCR Neurosurgery

## 2024-12-11 NOTE — Op Note (Signed)
 Indications: the patient is a 58yo female who was diagnosed with malfunctioning spinal cord stimulator.  She previously had her IPG removed and had a pocket tested which came back negative for any infection.  She is here today for placement of a right sided IPG for the spinal cord stimulator    Findings: successful placement of a right sided IPG for spinal cord stimulator.    Preoperative Diagnosis: T85.192AG89.4 MALFUNCTIONING SPINAL CORD STIMULATOR  Postoperative Diagnosis: T85.192AG89.4 MALFUNCTIONING SPINAL CORD STIMULATOR     EBL: 25 ml IVF: see anesthesia record Drains: none Disposition: Extubated and Stable to PACU Complications: none   No foley catheter was placed.     Preoperative Note:    Risks of surgery discussed in clinic.   Operative Note:      The patient was then brought from the preoperative center with intravenous access established.  The patient underwent general anesthesia and endotracheal tube intubation, then was rotated on the [facility] table where all pressure points were appropriately padded.    Fluoroscopy was utilized.  We are able to identify the anchors in the midline incision.  Small midline incision was opened after being infiltrated with local anesthetic.  The leads were found in found to be laterally tunneled as expected.  Unfortunately there was a significant amount of scar tissue around the leads making their direct pull and possible without concern for lead damage.  Because of this we then dissected them in the subcutaneous pocket all the way to the previous incision.  Small portion of the previous incision was opened and a significant amount of scar tissue was noted which was dissected off of the leads without any clear breach.  Once we are able to free these we then were able to tunnel them back to the midline and which point we were able to start making the right sided IPG pocket.  This was planned slightly superior to where her previous 1 was on  the left side as she was having significant amount of pain when seated.  This was infiltrated and a pocket was created.  The IPG was connected after the leads were tunneled laterally.  It was interrogated and functioning.  Antibiotic-containing pouch was placed at each incision.  This was after there was a copious amount of irrigation and meticulous hemostasis was obtained.  We then made sure to secure the IPG into place with suture.  We then closed the pocket over top of this and closed in multiple layers at each incision postoperative x-rays were reviewed and demonstrated leads in good position with a IPG now on the right.  Glue was most superficial layer.  Stable dressings were placed.     Patient was then rotated back to the preoperative bed awakened from anesthesia and taken to recovery. All counts are correct in this case.   I performed the entire procedure without assistant surgeon.  Implant Name Type Inv. Item Serial No. Manufacturer Lot No. LRB No. Used Action  NEUROSTIM INCEPTIV - [id number] H Neuro Prosthesis/Implant NAOMIA FALCON [id number] H [employer] NEUROMOD PAIN MGMT  Right 1 Implanted  [address] NEURO MED - [id number] Mesh General POUCH [employer] NEURO MED  [employer] NEUROMOD PAIN MGMT [id number] Right 1 Implanted    Penne MICAEL Sharps, MD

## 2024-12-23 NOTE — Progress Notes (Unsigned)
" ° °  REFERRING PHYSICIAN:  Rudolpho Norleen BIRCH, Md [address] [place],  [place] [address]  DOS: 12/11/24  placement of right sided IPG for SCS  HISTORY OF PRESENT ILLNESS: Cassandra Brown is approximately 2 weeks status post above surgery. Was given oxycodone  to take for breakthrough (takes chronic norco 10 q 4 hours prn) on discharge from the hospital.   She is back on her chronic norco and is not taking oxycodone . She notes sensitivity around her incisions.   PHYSICAL EXAMINATION:  General: Patient is well developed, well nourished, calm, collected, and in no apparent distress.   NEUROLOGICAL:  General: In no acute distress.   Awake, alert, oriented to person, place, and time.  Pupils equal round and reactive to light.  Facial tone is symmetric.     Strength:           Side Iliopsoas Quads Hamstring PF DF EHL  R 5 5 5 5 5 5   L 5 5 5 5 5 5    Incisions c/d/i   ROS (Neurologic):  Negative except as noted above  IMAGING: Nothing new to review.   ASSESSMENT/PLAN:  COZY VEALE is doing well s/p above surgery. Treatment options reviewed with patient and following plan made:   - I have advised the patient to lift up to 10 pounds until 6 weeks after surgery (follow up with Dr. Claudene).  - Reviewed wound care.  - No bending, twisting, or lifting.  - Continue on current medications including her chronic norco.  - SCS rep met with her today for programming.   - Follow up as scheduled in 4 weeks and prn.   Advised to contact the office if any questions or concerns arise.  Glade Boys PA-C Department of neurosurgery "

## 2024-12-25 ENCOUNTER — Encounter: Payer: Self-pay | Admitting: Orthopedic Surgery

## 2024-12-25 ENCOUNTER — Encounter: Admitting: Orthopedic Surgery

## 2024-12-25 VITALS — BP 130/76 | Temp 98.2°F | Ht 62.0 in | Wt 189.5 lb

## 2024-12-25 DIAGNOSIS — T85192A Other mechanical complication of implanted electronic neurostimulator (electrode) of spinal cord, initial encounter: Secondary | ICD-10-CM

## 2024-12-25 DIAGNOSIS — T85192D Other mechanical complication of implanted electronic neurostimulator (electrode) of spinal cord, subsequent encounter: Secondary | ICD-10-CM

## 2024-12-25 DIAGNOSIS — G894 Chronic pain syndrome: Secondary | ICD-10-CM | POA: Diagnosis not present

## 2024-12-25 DIAGNOSIS — Z09 Encounter for follow-up examination after completed treatment for conditions other than malignant neoplasm: Secondary | ICD-10-CM

## 2025-01-21 ENCOUNTER — Encounter: Admitting: Neurosurgery

## 2025-02-04 ENCOUNTER — Encounter: Admitting: Neurosurgery

## 2025-02-25 ENCOUNTER — Encounter: Admitting: Physician Assistant
